# Patient Record
Sex: Female | Born: 1990
Health system: Southern US, Community
[De-identification: ages and names within clinical notes are randomized; demographics above are authoritative.]

## PROBLEM LIST (undated history)

## (undated) ENCOUNTER — Inpatient Hospital Stay (HOSPITAL_COMMUNITY): Payer: Self-pay

## (undated) DIAGNOSIS — N390 Urinary tract infection, site not specified: Secondary | ICD-10-CM

## (undated) DIAGNOSIS — J40 Bronchitis, not specified as acute or chronic: Secondary | ICD-10-CM

## (undated) DIAGNOSIS — A749 Chlamydial infection, unspecified: Secondary | ICD-10-CM

## (undated) DIAGNOSIS — A599 Trichomoniasis, unspecified: Secondary | ICD-10-CM

## (undated) DIAGNOSIS — D649 Anemia, unspecified: Secondary | ICD-10-CM

## (undated) DIAGNOSIS — G43909 Migraine, unspecified, not intractable, without status migrainosus: Secondary | ICD-10-CM

## (undated) DIAGNOSIS — O24419 Gestational diabetes mellitus in pregnancy, unspecified control: Secondary | ICD-10-CM

## (undated) HISTORY — PX: NO PAST SURGERIES: SHX2092

---
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
## 2010-03-12 ENCOUNTER — Inpatient Hospital Stay (HOSPITAL_COMMUNITY): Admission: AD | Admit: 2010-03-12 | Discharge: 2010-03-12 | Payer: Self-pay | Admitting: Obstetrics & Gynecology

## 2010-04-15 ENCOUNTER — Ambulatory Visit: Payer: Self-pay | Admitting: Obstetrics and Gynecology

## 2010-09-28 ENCOUNTER — Emergency Department (HOSPITAL_COMMUNITY)
Admission: EM | Admit: 2010-09-28 | Discharge: 2010-09-28 | Payer: Self-pay | Source: Home / Self Care | Admitting: Emergency Medicine

## 2010-10-24 DIAGNOSIS — G43909 Migraine, unspecified, not intractable, without status migrainosus: Secondary | ICD-10-CM

## 2010-10-24 HISTORY — DX: Migraine, unspecified, not intractable, without status migrainosus: G43.909

## 2011-01-09 LAB — COMPREHENSIVE METABOLIC PANEL
AST: 16 U/L (ref 0–37)
Albumin: 4 g/dL (ref 3.5–5.2)
CO2: 30 mEq/L (ref 19–32)
Calcium: 9.9 mg/dL (ref 8.4–10.5)
Creatinine, Ser: 0.72 mg/dL (ref 0.4–1.2)
GFR calc Af Amer: >60 mL/min (ref >60)
Glucose, Bld: 105 mg/dL — ABNORMAL HIGH (ref 70–99)
Potassium: 4.3 mEq/L (ref 3.5–5.1)

## 2011-01-09 LAB — GC/CHLAMYDIA PROBE AMP, GENITAL: GC Probe Amp, Genital: NEGATIVE

## 2011-01-09 LAB — CBC
Hemoglobin: 12 g/dL (ref 12.0–15.0)
RBC: 5.28 MIL/uL — ABNORMAL HIGH (ref 3.87–5.11)
WBC: 7.4 10*3/uL (ref 4.0–10.5)

## 2011-01-09 LAB — URINE MICROSCOPIC-ADD ON

## 2011-01-09 LAB — URINALYSIS, ROUTINE W REFLEX MICROSCOPIC
Bilirubin Urine: NEGATIVE
Specific Gravity, Urine: 1.025 (ref 1.005–1.030)

## 2011-01-09 LAB — POCT PREGNANCY, URINE: Preg Test, Ur: NEGATIVE

## 2011-01-09 LAB — WET PREP, GENITAL
Clue Cells Wet Prep HPF POC: NONE SEEN
Trich, Wet Prep: NONE SEEN

## 2011-01-10 LAB — URINALYSIS, ROUTINE W REFLEX MICROSCOPIC
Bilirubin Urine: NEGATIVE
Glucose, UA: NEGATIVE mg/dL
Protein, ur: NEGATIVE mg/dL
Urobilinogen, UA: 0.2 mg/dL (ref 0.0–1.0)

## 2011-01-10 LAB — URINE MICROSCOPIC-ADD ON

## 2011-01-10 LAB — CBC
MCV: 72.4 fL — ABNORMAL LOW (ref 78.0–100.0)
Platelets: 282 10*3/uL (ref 150–400)
RBC: 5.14 MIL/uL — ABNORMAL HIGH (ref 3.87–5.11)
RDW: 13.9 % (ref 11.5–15.5)
WBC: 8.8 10*3/uL (ref 4.0–10.5)

## 2011-01-10 LAB — POCT PREGNANCY, URINE: Preg Test, Ur: NEGATIVE

## 2011-01-16 LAB — COMPREHENSIVE METABOLIC PANEL
ALT: 11 U/L (ref 0–35)
Alkaline Phosphatase: 64 U/L (ref 39–117)
CO2: 28 mEq/L (ref 19–32)
Chloride: 100 mEq/L (ref 96–112)
GFR calc non Af Amer: >60 mL/min (ref >60)
Glucose, Bld: 80 mg/dL (ref 70–99)
Potassium: 4.6 mEq/L (ref 3.5–5.1)
Sodium: 132 mEq/L — ABNORMAL LOW (ref 135–145)
Total Bilirubin: 0.4 mg/dL (ref 0.3–1.2)

## 2011-01-16 LAB — CBC
MCHC: 32 g/dL (ref 30.0–36.0)
MCV: 72.9 fL — ABNORMAL LOW (ref 78.0–100.0)
RBC: 4.93 MIL/uL (ref 3.87–5.11)
WBC: 8 10*3/uL (ref 4.0–10.5)

## 2011-01-16 LAB — DIFFERENTIAL
Basophils Relative: 0 % (ref 0–1)
Eosinophils Absolute: 0.1 10*3/uL (ref 0.0–0.7)
Neutrophils Relative %: 78 % — ABNORMAL HIGH (ref 43–77)

## 2011-01-27 LAB — URINALYSIS, ROUTINE W REFLEX MICROSCOPIC
Bilirubin Urine: NEGATIVE
Ketones, ur: NEGATIVE mg/dL
Nitrite: NEGATIVE
Protein, ur: NEGATIVE mg/dL
Protein, ur: NEGATIVE mg/dL
Urobilinogen, UA: 0.2 mg/dL (ref 0.0–1.0)

## 2011-01-27 LAB — POCT PREGNANCY, URINE
Preg Test, Ur: NEGATIVE
Preg Test, Ur: NEGATIVE

## 2011-01-27 LAB — COMPREHENSIVE METABOLIC PANEL
ALT: 14 U/L (ref 0–35)
AST: 13 U/L (ref 0–37)
Albumin: 3.8 g/dL (ref 3.5–5.2)
Alkaline Phosphatase: 65 U/L (ref 47–119)
BUN: 7 mg/dL (ref 6–23)
Chloride: 104 mEq/L (ref 96–112)
Potassium: 3.7 mEq/L (ref 3.5–5.1)
Sodium: 137 mEq/L (ref 135–145)
Total Bilirubin: 0.2 mg/dL — ABNORMAL LOW (ref 0.3–1.2)

## 2011-01-27 LAB — DIFFERENTIAL
Basophils Absolute: 0 10*3/uL (ref 0.0–0.1)
Basophils Relative: 0 % (ref 0–1)
Eosinophils Absolute: 0.1 10*3/uL (ref 0.0–1.2)
Eosinophils Relative: 1 % (ref 0–5)
Monocytes Absolute: 0.5 10*3/uL (ref 0.2–1.2)
Monocytes Relative: 6 % (ref 3–11)
Neutro Abs: 7.5 10*3/uL (ref 1.7–8.0)

## 2011-01-27 LAB — CBC
HCT: 37.5 % (ref 36.0–49.0)
Platelets: 262 10*3/uL (ref 150–400)
WBC: 9.4 10*3/uL (ref 4.5–13.5)

## 2011-01-27 LAB — URINE CULTURE: Culture: NO GROWTH

## 2011-01-27 LAB — GC/CHLAMYDIA PROBE AMP, GENITAL
Chlamydia, DNA Probe: NEGATIVE
GC Probe Amp, Genital: NEGATIVE

## 2011-01-27 LAB — RPR: RPR Ser Ql: NONREACTIVE

## 2011-01-27 LAB — URINE MICROSCOPIC-ADD ON

## 2011-01-29 LAB — URINE MICROSCOPIC-ADD ON

## 2011-01-29 LAB — URINALYSIS, ROUTINE W REFLEX MICROSCOPIC
Bilirubin Urine: NEGATIVE
Hgb urine dipstick: NEGATIVE
Nitrite: NEGATIVE
Specific Gravity, Urine: 1.015 (ref 1.005–1.030)
Urobilinogen, UA: 1 mg/dL (ref 0.0–1.0)
pH: 7 (ref 5.0–8.0)

## 2011-02-01 LAB — DIFFERENTIAL
Basophils Absolute: 0 10*3/uL (ref 0.0–0.1)
Basophils Relative: 0 % (ref 0–1)
Eosinophils Absolute: 0.1 10*3/uL (ref 0.0–1.2)
Monocytes Relative: 5 % (ref 3–11)
Neutro Abs: 9.4 10*3/uL — ABNORMAL HIGH (ref 1.7–8.0)
Neutrophils Relative %: 84 % — ABNORMAL HIGH (ref 43–71)

## 2011-02-01 LAB — URINALYSIS, ROUTINE W REFLEX MICROSCOPIC
Glucose, UA: NEGATIVE mg/dL
Hgb urine dipstick: NEGATIVE
Ketones, ur: NEGATIVE mg/dL
Protein, ur: NEGATIVE mg/dL
Urobilinogen, UA: 0.2 mg/dL (ref 0.0–1.0)

## 2011-02-01 LAB — BASIC METABOLIC PANEL
BUN: 3 mg/dL — ABNORMAL LOW (ref 6–23)
CO2: 29 mEq/L (ref 19–32)
Calcium: 9.6 mg/dL (ref 8.4–10.5)
Chloride: 103 mEq/L (ref 96–112)
Creatinine, Ser: 0.57 mg/dL (ref 0.4–1.2)
Glucose, Bld: 108 mg/dL — ABNORMAL HIGH (ref 70–99)

## 2011-02-01 LAB — CBC
MCHC: 32.3 g/dL (ref 31.0–37.0)
MCV: 72.8 fL — ABNORMAL LOW (ref 78.0–98.0)
Platelets: 258 10*3/uL (ref 150–400)
RDW: 13.7 % (ref 11.4–15.5)

## 2011-02-07 LAB — URINALYSIS, ROUTINE W REFLEX MICROSCOPIC
Nitrite: NEGATIVE
Specific Gravity, Urine: 1.027 (ref 1.005–1.030)
Urobilinogen, UA: 1 mg/dL (ref 0.0–1.0)
pH: 6.5 (ref 5.0–8.0)

## 2011-02-07 LAB — DIFFERENTIAL
Basophils Relative: 0 % (ref 0–1)
Lymphocytes Relative: 18 % — ABNORMAL LOW (ref 24–48)
Monocytes Relative: 5 % (ref 3–11)
Neutro Abs: 6.7 10*3/uL (ref 1.7–8.0)
Neutrophils Relative %: 75 % — ABNORMAL HIGH (ref 43–71)

## 2011-02-07 LAB — URINE MICROSCOPIC-ADD ON

## 2011-02-07 LAB — CBC
MCHC: 31.9 g/dL (ref 31.0–37.0)
RBC: 5.2 MIL/uL (ref 3.80–5.70)
WBC: 8.9 10*3/uL (ref 4.5–13.5)

## 2011-02-08 LAB — GC/CHLAMYDIA PROBE AMP, GENITAL
Chlamydia, DNA Probe: POSITIVE — AB
GC Probe Amp, Genital: NEGATIVE

## 2011-02-08 LAB — GC/CHLAMYDIA PROBE AMP, URINE
Chlamydia, Swab/Urine, PCR: NEGATIVE
GC Probe Amp, Urine: NEGATIVE

## 2011-02-08 LAB — URINALYSIS, ROUTINE W REFLEX MICROSCOPIC
Bilirubin Urine: NEGATIVE
Glucose, UA: NEGATIVE mg/dL
Hgb urine dipstick: NEGATIVE
Ketones, ur: NEGATIVE mg/dL
Nitrite: NEGATIVE
Protein, ur: NEGATIVE mg/dL
Specific Gravity, Urine: 1.018 (ref 1.005–1.030)
Urobilinogen, UA: 1 mg/dL (ref 0.0–1.0)
pH: 7.5 (ref 5.0–8.0)

## 2011-02-08 LAB — WET PREP, GENITAL
Clue Cells Wet Prep HPF POC: NONE SEEN
Trich, Wet Prep: NONE SEEN
Yeast Wet Prep HPF POC: NONE SEEN

## 2011-02-08 LAB — POCT PREGNANCY, URINE: Preg Test, Ur: NEGATIVE

## 2011-03-07 ENCOUNTER — Inpatient Hospital Stay (HOSPITAL_COMMUNITY)
Admission: AD | Admit: 2011-03-07 | Discharge: 2011-03-08 | Disposition: A | Discharge status: Home / Self Care | Payer: Self-pay | Source: Ambulatory Visit | Attending: Family Medicine | Admitting: Family Medicine

## 2011-03-07 DIAGNOSIS — A5901 Trichomonal vulvovaginitis: Secondary | ICD-10-CM | POA: Insufficient documentation

## 2011-03-07 DIAGNOSIS — R109 Unspecified abdominal pain: Secondary | ICD-10-CM

## 2011-03-07 DIAGNOSIS — R51 Headache: Secondary | ICD-10-CM | POA: Insufficient documentation

## 2011-03-07 LAB — URINALYSIS, ROUTINE W REFLEX MICROSCOPIC
Protein, ur: NEGATIVE mg/dL
Urobilinogen, UA: 0.2 mg/dL (ref 0.0–1.0)

## 2011-03-07 LAB — WET PREP, GENITAL

## 2011-03-07 LAB — URINE MICROSCOPIC-ADD ON

## 2011-03-07 LAB — POCT PREGNANCY, URINE: Preg Test, Ur: NEGATIVE

## 2011-03-08 LAB — GC/CHLAMYDIA PROBE AMP, GENITAL
Chlamydia, DNA Probe: NEGATIVE
GC Probe Amp, Genital: NEGATIVE

## 2011-03-10 ENCOUNTER — Inpatient Hospital Stay (HOSPITAL_COMMUNITY)
Admission: AD | Admit: 2011-03-10 | Discharge: 2011-03-10 | Disposition: A | Discharge status: Home / Self Care | Payer: Self-pay | Source: Ambulatory Visit | Attending: Family Medicine | Admitting: Family Medicine

## 2011-03-10 DIAGNOSIS — R109 Unspecified abdominal pain: Secondary | ICD-10-CM | POA: Insufficient documentation

## 2011-03-10 LAB — COMPREHENSIVE METABOLIC PANEL
Albumin: 3.9 g/dL (ref 3.5–5.2)
Alkaline Phosphatase: 72 U/L (ref 39–117)
BUN: 7 mg/dL (ref 6–23)
CO2: 27 mEq/L (ref 19–32)
Chloride: 100 mEq/L (ref 96–112)
Creatinine, Ser: 0.77 mg/dL (ref 0.4–1.2)
GFR calc non Af Amer: >60 mL/min (ref >60)
Glucose, Bld: 120 mg/dL — ABNORMAL HIGH (ref 70–99)
Potassium: 3.5 mEq/L (ref 3.5–5.1)
Total Bilirubin: 0.2 mg/dL — ABNORMAL LOW (ref 0.3–1.2)

## 2011-03-10 LAB — URINALYSIS, ROUTINE W REFLEX MICROSCOPIC
Glucose, UA: NEGATIVE mg/dL
Leukocytes, UA: NEGATIVE
Nitrite: NEGATIVE
Protein, ur: 30 mg/dL — AB

## 2011-03-10 LAB — CBC
Hemoglobin: 11.5 g/dL — ABNORMAL LOW (ref 12.0–15.0)
MCHC: 30.5 g/dL (ref 30.0–36.0)
Platelets: 291 10*3/uL (ref 150–400)
RBC: 5.34 MIL/uL — ABNORMAL HIGH (ref 3.87–5.11)

## 2011-03-10 LAB — POCT PREGNANCY, URINE: Preg Test, Ur: NEGATIVE

## 2011-03-10 LAB — HCG, SERUM, QUALITATIVE: Preg, Serum: NEGATIVE

## 2011-03-10 LAB — URINE MICROSCOPIC-ADD ON

## 2011-05-19 ENCOUNTER — Inpatient Hospital Stay (HOSPITAL_COMMUNITY)
Admission: AD | Admit: 2011-05-19 | Discharge: 2011-05-19 | Disposition: A | Discharge status: Home / Self Care | Payer: Self-pay | Source: Ambulatory Visit | Attending: Obstetrics & Gynecology | Admitting: Obstetrics & Gynecology

## 2011-05-19 ENCOUNTER — Encounter (HOSPITAL_COMMUNITY): Payer: Self-pay | Admitting: *Deleted

## 2011-05-19 DIAGNOSIS — N9489 Other specified conditions associated with female genital organs and menstrual cycle: Secondary | ICD-10-CM | POA: Insufficient documentation

## 2011-05-19 DIAGNOSIS — N898 Other specified noninflammatory disorders of vagina: Secondary | ICD-10-CM | POA: Insufficient documentation

## 2011-05-19 DIAGNOSIS — R109 Unspecified abdominal pain: Secondary | ICD-10-CM | POA: Insufficient documentation

## 2011-05-19 HISTORY — DX: Trichomoniasis, unspecified: A59.9

## 2011-05-19 HISTORY — DX: Migraine, unspecified, not intractable, without status migrainosus: G43.909

## 2011-05-19 HISTORY — DX: Chlamydial infection, unspecified: A74.9

## 2011-05-19 LAB — URINALYSIS, ROUTINE W REFLEX MICROSCOPIC
Ketones, ur: NEGATIVE mg/dL
Nitrite: NEGATIVE
Protein, ur: NEGATIVE mg/dL
Urobilinogen, UA: 0.2 mg/dL (ref 0.0–1.0)

## 2011-05-19 LAB — URINE MICROSCOPIC-ADD ON

## 2011-05-19 LAB — WET PREP, GENITAL: Yeast Wet Prep HPF POC: NONE SEEN

## 2011-05-19 LAB — POCT PREGNANCY, URINE: Preg Test, Ur: NEGATIVE

## 2011-05-19 MED ORDER — FLUCONAZOLE 150 MG PO TABS
150.0000 mg | ORAL_TABLET | Freq: Once | ORAL | Status: AC
  Administered 2011-05-19: 150 mg via ORAL
  Filled 2011-05-19: qty 1

## 2011-05-19 NOTE — Progress Notes (Signed)
By Jenean Lindau PA

## 2011-05-19 NOTE — Progress Notes (Signed)
First intercourse with new partner 3 days ago, breakout began yesterday.

## 2011-05-19 NOTE — ED Provider Notes (Signed)
Lab results back and Wet prep shows TNTC WBC's, few clue cells, the urine shows tr LE and 3 - 6 WBC. Patient denies any UTI symptoms. States it only burns when urine hits the abrasion in the vaginal area. Discussed with patient results of labs and she will only get a call if cultures are positive. Will tx with diflucan today.  [place], [place] 05/19/11 1534

## 2011-05-19 NOTE — ED Provider Notes (Signed)
History   Pt presents today c/o vag irritation for the past 2 days. She also c/o lower abd pain. She was placed on OCPs at the clinic and she felt better when she was taking them. However, she stopped her medication and did not f/u with the provider. She denies vag bleeding or any other sx at this time.  Chief Complaint  Patient presents with  . Vaginal Pain   HPI  OB History    Grav Para Term Preterm Abortions TAB SAB Ect Mult Living   0               Past Medical History  Diagnosis Date  . Chlamydia   . Trichomonas   . Migraines     Past Surgical History  Procedure Date  . No past surgeries     No family history on file.  History  Substance Use Topics  . Smoking status: Never Smoker   . Smokeless tobacco: Never Used  . Alcohol Use: Yes    Allergies: No Known Allergies  Prescriptions prior to admission  Medication Sig Dispense Refill  . acetaminophen (TYLENOL) 500 MG tablet Take 1,500 mg by mouth every 6 (six) hours as needed. PATIENT TAKES FOR PAIN         Review of Systems  Constitutional: Negative for fever.  Cardiovascular: Negative for chest pain and palpitations.  Gastrointestinal: Positive for abdominal pain. Negative for nausea, vomiting, diarrhea and constipation.  Genitourinary: Negative for dysuria, urgency, frequency and hematuria.  Skin: Positive for itching.  Neurological: Negative for dizziness and headaches.  Psychiatric/Behavioral: Negative for depression and suicidal ideas.   Physical Exam   Blood pressure 124/68, pulse 81, temperature 98.9 F (37.2 C), temperature source Oral, resp. rate 16, height 4' 10.5" (1.486 m), weight 128 lb 9.6 oz (58.333 kg), last menstrual period 04/23/2011, SpO2 99.00%.  Physical Exam  Constitutional: She appears well-developed and well-nourished. No distress.  HENT:  Head: Normocephalic and atraumatic.  GI: Soft. She exhibits no distension. There is no tenderness. There is no rebound and no guarding.    Genitourinary:    There is tenderness around the vagina. No bleeding around the vagina. Vaginal discharge found.  Skin: She is not diaphoretic.    MAU Course  Procedures    Assessment and Plan  Care of this pt was transferred to [facility], NP.  Odile Veloso 05/19/2011, 2:32 PM   Henrietta Hoover, PA 05/20/11 0834  Henrietta Hoover, PA 05/20/11 [phone]

## 2011-05-19 NOTE — Progress Notes (Signed)
Pt states she has had abdominal pain "for a long time". Has been seen at [facility] before for this pain and cannot find out what it is and it keeps coming. Pt state she has been having vaginal pain with intercourse(new partner) for about one week and has a "breakout"  Rash with itching around the vagina.

## 2011-05-22 NOTE — ED Provider Notes (Signed)
Cultures came back positive for both GC and Chlamydia. Called patient at home/cell number listed. Message states pt is not available and does not give option to leave message. Will attempt to call again later today.

## 2011-05-23 ENCOUNTER — Encounter (HOSPITAL_COMMUNITY): Payer: Self-pay | Admitting: *Deleted

## 2011-05-23 LAB — HERPES SIMPLEX VIRUS CULTURE: Culture: NOT DETECTED

## 2011-05-30 ENCOUNTER — Telehealth (HOSPITAL_COMMUNITY): Payer: Self-pay

## 2011-07-26 LAB — URINALYSIS, ROUTINE W REFLEX MICROSCOPIC
Bilirubin Urine: NEGATIVE
Ketones, ur: NEGATIVE
Nitrite: NEGATIVE
Urobilinogen, UA: 0.2
pH: 6.5

## 2011-07-26 LAB — MONONUCLEOSIS SCREEN: Mono Screen: NEGATIVE

## 2011-07-26 LAB — URINE MICROSCOPIC-ADD ON

## 2011-07-29 LAB — WET PREP, GENITAL
Clue Cells Wet Prep HPF POC: NONE SEEN
Trich, Wet Prep: NONE SEEN
Yeast Wet Prep HPF POC: NONE SEEN

## 2011-07-29 LAB — URINALYSIS, ROUTINE W REFLEX MICROSCOPIC
Bilirubin Urine: NEGATIVE
Ketones, ur: NEGATIVE mg/dL
Nitrite: NEGATIVE
Protein, ur: NEGATIVE mg/dL
Specific Gravity, Urine: 1.015 (ref 1.005–1.030)
Urobilinogen, UA: 0.2 mg/dL (ref 0.0–1.0)

## 2011-07-29 LAB — URINE MICROSCOPIC-ADD ON

## 2011-07-29 LAB — CBC
Platelets: 267 10*3/uL (ref 150–400)
WBC: 8.5 10*3/uL (ref 4.5–13.5)

## 2011-07-29 LAB — POCT PREGNANCY, URINE: Preg Test, Ur: NEGATIVE

## 2011-10-12 ENCOUNTER — Encounter (HOSPITAL_COMMUNITY): Payer: Self-pay | Admitting: *Deleted

## 2011-10-12 ENCOUNTER — Emergency Department (HOSPITAL_COMMUNITY)
Admission: EM | Admit: 2011-10-12 | Discharge: 2011-10-12 | Disposition: A | Discharge status: Home / Self Care | Payer: Self-pay | Attending: Emergency Medicine | Admitting: Emergency Medicine

## 2011-10-12 DIAGNOSIS — J40 Bronchitis, not specified as acute or chronic: Secondary | ICD-10-CM | POA: Insufficient documentation

## 2011-10-12 DIAGNOSIS — R070 Pain in throat: Secondary | ICD-10-CM | POA: Insufficient documentation

## 2011-10-12 DIAGNOSIS — R0602 Shortness of breath: Secondary | ICD-10-CM | POA: Insufficient documentation

## 2011-10-12 MED ORDER — HYDROCOD POLST-CHLORPHEN POLST 10-8 MG/5ML PO LQCR: 5.0000 mL | Freq: Every evening | ORAL | Status: DC | PRN

## 2011-10-12 MED ORDER — ALBUTEROL SULFATE HFA 108 (90 BASE) MCG/ACT IN AERS: 2.0000 | INHALATION_SPRAY | RESPIRATORY_TRACT | Status: DC | PRN

## 2011-10-12 MED ORDER — AZITHROMYCIN 250 MG PO TABS: 250.0000 mg | ORAL_TABLET | Freq: Every day | ORAL | Status: AC

## 2011-10-12 NOTE — ED Notes (Signed)
Patient states she has had a sore throat, shortness of breath, headaches, and chest pain for 2 weeks.  She reports uri sx.  Patient also reports she has diff sleeping due to sob

## 2011-10-12 NOTE — ED Provider Notes (Signed)
History     CSN: [id number] Arrival date & time: 10/12/2011  2:53 PM   First MD Initiated Contact with Patient 10/12/11 1907      Chief Complaint  Patient presents with  . Sore Throat  . Shortness of Breath  . URI    (Consider location/radiation/quality/duration/timing/severity/associated sxs/prior treatment) HPI Comments: Patient reports she has been sick for 20 full weeks with cough, sore throat, chest congestion, nasal congestion, intermittent fevers to 20, and body aches.  States her cough syrup is not helping her at night and she feels the mucus is too thick to cough up.  Cough has been productive of yellow-green sputum.    Patient is a 20 y.o. female presenting with pharyngitis, shortness of breath, and URI. The history is provided by the patient.  Sore Throat  Shortness of Breath  Associated symptoms include shortness of breath.  URI    Past Medical History  Diagnosis Date  . Chlamydia   . Trichomonas   . Migraines     Past Surgical History  Procedure Date  . No past surgeries     No family history on file.  History  Substance Use Topics  . Smoking status: Never Smoker   . Smokeless tobacco: Never Used  . Alcohol Use: Yes    OB History    Grav Para Term Preterm Abortions TAB SAB Ect Mult Living   0               Review of Systems  Respiratory: Positive for shortness of breath.   All other systems reviewed and are negative.    Allergies  Review of patient's allergies indicates no known allergies.  Home Medications   Current Outpatient Rx  Name Route Sig Dispense Refill  . ACETAMINOPHEN 500 MG PO TABS Oral Take 1,500 mg by mouth every 6 (six) hours as needed. PATIENT TAKES FOR PAIN     . GUAIFENESIN 100 MG/5ML PO SYRP Oral Take 400 mg by mouth 3 (three) times daily as needed.        BP 119/60  Pulse 82  Temp(Src) 97.8 F (36.6 C) (Oral)  Resp 16  SpO2 100%  Physical Exam  Nursing note and vitals reviewed. Constitutional: She is  oriented to person, place, and time. She appears well-developed and well-nourished.  HENT:  Head: Normocephalic and atraumatic.  Right Ear: External ear normal.  Left Ear: External ear normal.  Nose: Mucosal edema and rhinorrhea present.  Mouth/Throat: Uvula is midline and mucous membranes are normal. No uvula swelling. Posterior oropharyngeal erythema present. No oropharyngeal exudate, posterior oropharyngeal edema or tonsillar abscesses.  Neck: Trachea normal, normal range of motion and phonation normal. Neck supple. No tracheal tenderness present. No rigidity. No tracheal deviation and normal range of motion present.  Cardiovascular: Normal rate and regular rhythm.   Pulmonary/Chest: Effort normal and breath sounds normal. No stridor. No respiratory distress. She has no decreased breath sounds. She has no wheezes. She has no rales.       +cough  Neurological: She is alert and oriented to person, place, and time.    ED Course  Procedures (including critical care time)  Labs Reviewed - No data to display No results found.   1. Bronchitis       MDM  Patient with upper respiratory symptoms x 2 weeks with intermittent fevers at home.  D/C with antibiotic as duration of illness suggests possibility of bacterial component.          Dillard Cannon  [place], [place] 10/12/11 2032

## 2011-10-13 NOTE — ED Provider Notes (Signed)
Medical screening examination/treatment/procedure(s) were performed by non-physician practitioner and as supervising physician I was immediately available for consultation/collaboration.  Toy Baker, MD 10/13/11 [phone]

## 2011-10-23 ENCOUNTER — Inpatient Hospital Stay (HOSPITAL_COMMUNITY)
Admission: AD | Admit: 2011-10-23 | Discharge: 2011-10-23 | Disposition: A | Discharge status: Home / Self Care | Payer: Self-pay | Source: Ambulatory Visit | Attending: Obstetrics & Gynecology | Admitting: Obstetrics & Gynecology

## 2011-10-23 ENCOUNTER — Encounter (HOSPITAL_COMMUNITY): Payer: Self-pay | Admitting: *Deleted

## 2011-10-23 DIAGNOSIS — N949 Unspecified condition associated with female genital organs and menstrual cycle: Secondary | ICD-10-CM | POA: Insufficient documentation

## 2011-10-23 DIAGNOSIS — A499 Bacterial infection, unspecified: Secondary | ICD-10-CM

## 2011-10-23 DIAGNOSIS — R1032 Left lower quadrant pain: Secondary | ICD-10-CM | POA: Insufficient documentation

## 2011-10-23 DIAGNOSIS — B373 Candidiasis of vulva and vagina: Secondary | ICD-10-CM

## 2011-10-23 DIAGNOSIS — B3731 Acute candidiasis of vulva and vagina: Secondary | ICD-10-CM | POA: Insufficient documentation

## 2011-10-23 DIAGNOSIS — N760 Acute vaginitis: Secondary | ICD-10-CM | POA: Insufficient documentation

## 2011-10-23 DIAGNOSIS — B9689 Other specified bacterial agents as the cause of diseases classified elsewhere: Secondary | ICD-10-CM | POA: Insufficient documentation

## 2011-10-23 HISTORY — DX: Bronchitis, not specified as acute or chronic: J40

## 2011-10-23 LAB — URINE MICROSCOPIC-ADD ON

## 2011-10-23 LAB — POCT PREGNANCY, URINE: Preg Test, Ur: NEGATIVE

## 2011-10-23 LAB — URINALYSIS, ROUTINE W REFLEX MICROSCOPIC
Bilirubin Urine: NEGATIVE
Glucose, UA: NEGATIVE mg/dL
Ketones, ur: 15 mg/dL — AB
Specific Gravity, Urine: 1.025 (ref 1.005–1.030)
pH: 6 (ref 5.0–8.0)

## 2011-10-23 LAB — WET PREP, GENITAL: Trich, Wet Prep: NONE SEEN

## 2011-10-23 MED ORDER — FLUCONAZOLE 150 MG PO TABS
150.0000 mg | ORAL_TABLET | Freq: Once | ORAL | Status: AC
  Administered 2011-10-23: 150 mg via ORAL
  Filled 2011-10-23: qty 1

## 2011-10-23 MED ORDER — METRONIDAZOLE 500 MG PO TABS: 500.0000 mg | ORAL_TABLET | Freq: Two times a day (BID) | ORAL | Status: AC

## 2011-10-23 MED ORDER — GUAIFENESIN 100 MG/5ML PO SOLN
10.0000 mL | Freq: Once | ORAL | Status: AC
  Administered 2011-10-23: 200 mg via ORAL
  Filled 2011-10-23: qty 15

## 2011-10-23 NOTE — Progress Notes (Signed)
C/o burning upon urination today, vaginal pain during intercourse and mostly after orgasm, lower left abd pain, watery discharge with an odor.

## 2011-10-23 NOTE — Progress Notes (Signed)
Pt states for the last monthe she has had vaginal pain and a discharge that is watery and clear for the last 2 weeks-sttes she also has lower abd pain-appears relaxed with no signs of pain

## 2011-10-23 NOTE — ED Provider Notes (Signed)
History     Chief Complaint  Patient presents with  . Vaginal Pain   HPI  C/o burning upon urination today, vaginal pain during intercourse and mostly after orgasm, lower left abd pain, watery discharge with an odor x 1 month.  LMP 08/27/11, no birth control method.     Past Medical History  Diagnosis Date  . Chlamydia   . Trichomonas   . Migraines   . Bronchitis     Past Surgical History  Procedure Date  . No past surgeries     Family History  Problem Relation Age of Onset  . Asthma Mother   . Arthritis Mother   . Diabetes Mother   . Heart disease Mother   . Hypertension Mother   . Stroke Mother   . Kidney disease Mother   . Vision loss Mother   . Diabetes Maternal Grandmother   . Hypertension Maternal Grandmother     History  Substance Use Topics  . Smoking status: Never Smoker   . Smokeless tobacco: Former Neurosurgeon  . Alcohol Use: Yes     socially    Allergies: No Known Allergies  Prescriptions prior to admission  Medication Sig Dispense Refill  . acetaminophen (TYLENOL) 500 MG tablet Take 1,500 mg by mouth every 6 (six) hours as needed. PATIENT TAKES FOR PAIN       . albuterol (PROVENTIL HFA;VENTOLIN HFA) 108 (90 BASE) MCG/ACT inhaler Inhale 2 puffs into the lungs every 4 (four) hours as needed for wheezing or shortness of breath (cough).  1 Inhaler  0  . guaifenesin (ROBITUSSIN) 100 MG/5ML syrup Take 400 mg by mouth 3 (three) times daily as needed.          Review of Systems  Constitutional: Negative.   Respiratory: Positive for cough. Negative for hemoptysis and shortness of breath.   Gastrointestinal: Positive for abdominal pain.  Genitourinary:       Vaginal discharge, +odor  All other systems reviewed and are negative.   Physical Exam   Blood pressure 130/70, pulse 74, temperature 98.9 F (37.2 C), temperature source Oral, resp. rate 16, height 5' (1.524 m), weight 56.756 kg (125 lb 2 oz), last menstrual period 09/23/2011.  Physical Exam    Constitutional: She is oriented to person, place, and time. She appears well-developed and well-nourished. No distress.  HENT:  Head: Normocephalic.  Neck: Normal range of motion. Neck supple.  Cardiovascular: Normal rate, regular rhythm and normal heart sounds.   Respiratory: Effort normal and breath sounds normal. No respiratory distress.  GI: Soft. There is tenderness ( midpelvic). There is no CVA tenderness.  Genitourinary: Cervix exhibits no motion tenderness and no discharge. No bleeding around the vagina. Vaginal discharge (white, thick, curd-like) found.  Musculoskeletal: Normal range of motion.  Neurological: She is alert and oriented to person, place, and time.  Skin: Skin is warm and dry.  Psychiatric: She has a normal mood and affect.    MAU Course  Procedures  Results for orders placed during the hospital encounter of 10/23/11 (from the past 24 hour(s))  POCT PREGNANCY, URINE     Status: Normal   Collection Time   10/23/11  7:31 PM      Component Value Range   Preg Test, Ur NEGATIVE    WET PREP, GENITAL     Status: Abnormal   Collection Time   10/23/11  8:20 PM      Component Value Range   Yeast, Wet Prep RARE (*) NONE SEEN  Trich, Wet Prep NONE SEEN  NONE SEEN    Clue Cells, Wet Prep RARE (*) NONE SEEN    WBC, Wet Prep HPF POC MODERATE (*) NONE SEEN     Assessment and Plan  Yeast Infection Bacterial Vaginosis  Plan: Diflucan 150 mg po Flagyl FU prn  [facility] 10/23/2011, 8:12 PM

## 2012-01-03 ENCOUNTER — Inpatient Hospital Stay (HOSPITAL_COMMUNITY)
Admission: AD | Admit: 2012-01-03 | Discharge: 2012-01-03 | Disposition: A | Discharge status: Home / Self Care | Payer: Self-pay | Source: Ambulatory Visit | Attending: Obstetrics & Gynecology | Admitting: Obstetrics & Gynecology

## 2012-01-03 ENCOUNTER — Encounter (HOSPITAL_COMMUNITY): Payer: Self-pay | Admitting: *Deleted

## 2012-01-03 ENCOUNTER — Inpatient Hospital Stay (HOSPITAL_COMMUNITY): Payer: Self-pay

## 2012-01-03 DIAGNOSIS — N949 Unspecified condition associated with female genital organs and menstrual cycle: Secondary | ICD-10-CM | POA: Insufficient documentation

## 2012-01-03 DIAGNOSIS — R109 Unspecified abdominal pain: Secondary | ICD-10-CM | POA: Insufficient documentation

## 2012-01-03 LAB — CBC
MCV: 71.8 fL — ABNORMAL LOW (ref 78.0–100.0)
Platelets: 261 10*3/uL (ref 150–400)
RDW: 14.2 % (ref 11.5–15.5)
WBC: 8.6 10*3/uL (ref 4.0–10.5)

## 2012-01-03 LAB — URINALYSIS, ROUTINE W REFLEX MICROSCOPIC
Hgb urine dipstick: NEGATIVE
Ketones, ur: NEGATIVE mg/dL
Protein, ur: 30 mg/dL — AB
Urobilinogen, UA: 0.2 mg/dL (ref 0.0–1.0)

## 2012-01-03 LAB — WET PREP, GENITAL
Clue Cells Wet Prep HPF POC: NONE SEEN
Trich, Wet Prep: NONE SEEN

## 2012-01-03 LAB — URINE MICROSCOPIC-ADD ON

## 2012-01-03 MED ORDER — NAPROXEN SODIUM 550 MG PO TABS: 550.0000 mg | ORAL_TABLET | Freq: Two times a day (BID) | ORAL | Status: DC

## 2012-01-03 NOTE — MAU Provider Note (Signed)
Laura Hernandez is a 20 yr G0P0 present today with CC of abdominal pain and intermittent vaginal bleeding for the last few months. She reports irregular menstrual cycles and states her last menstruation was 10/29/11. Since that time she reports intermittent spotting along with a clear whitish vaginal discharge. She describes her abdominal pain as sharp and suprapubic usually occuring daily. She denies any urinary symptoms and fever but states she has been dx with Trich and Chlamydia  over the last few months. She denies the use of any contraceptive. She does not have a primary OB/GYN.   ROS as stated in HPI.   Physical Exam  General: Well appearing female in not acute distress.  Heart: RRR, no murmurs. Lungs: CTA bilaterally  Abdominal: BS present. Belly soft, non distended and without tenderness to palpation.  GU: Normal appearing external genitalia. White milky discharge noted. Cervix easily visualized. Bimanual: No adnexal or cervical motion tenderness.   Sam Dunn, PA-S2  I was present for the PA student's exam and agree with the above.  Results for orders placed during the hospital encounter of 01/03/12 (from the past 72 hour(s))  URINALYSIS, ROUTINE W REFLEX MICROSCOPIC     Status: Abnormal   Collection Time   01/03/12  2:35 PM      Component Value Range Comment   Color, Urine YELLOW  YELLOW     APPearance HAZY (*) CLEAR     Specific Gravity, Urine 1.020  1.005 - 1.030     pH 7.0  5.0 - 8.0     Glucose, UA NEGATIVE  NEGATIVE (mg/dL)    Hgb urine dipstick NEGATIVE  NEGATIVE     Bilirubin Urine NEGATIVE  NEGATIVE     Ketones, ur NEGATIVE  NEGATIVE (mg/dL)    Protein, ur 30 (*) NEGATIVE (mg/dL)    Urobilinogen, UA 0.2  0.0 - 1.0 (mg/dL)    Nitrite NEGATIVE  NEGATIVE     Leukocytes, UA TRACE (*) NEGATIVE    URINE MICROSCOPIC-ADD ON     Status: Abnormal   Collection Time   01/03/12  2:35 PM      Component Value Range Comment   Squamous Epithelial / LPF FEW (*) RARE     WBC, UA 7-10  <3  (WBC/hpf)    RBC / HPF 0-2  <3 (RBC/hpf)    Bacteria, UA MANY (*) RARE     Urine-Other MUCOUS PRESENT     POCT PREGNANCY, URINE     Status: Normal   Collection Time   01/03/12  2:42 PM      Component Value Range Comment   Preg Test, Ur NEGATIVE  NEGATIVE    WET PREP, GENITAL     Status: Abnormal   Collection Time   01/03/12  4:00 PM      Component Value Range Comment   Yeast Wet Prep HPF POC NONE SEEN  NONE SEEN     Trich, Wet Prep NONE SEEN  NONE SEEN     Clue Cells Wet Prep HPF POC NONE SEEN  NONE SEEN     WBC, Wet Prep HPF POC MANY (*) NONE SEEN  MODERATE BACTERIA SEEN  CBC     Status: Abnormal   Collection Time   01/03/12  4:20 PM      Component Value Range Comment   WBC 8.6  4.0 - 10.5 (K/uL)    RBC 5.22 (*) 3.87 - 5.11 (MIL/uL)    Hemoglobin 11.4 (*) 12.0 - 15.0 (g/dL)    HCT 37.5  36.0 - 46.0 (%)    MCV 71.8 (*) 78.0 - 100.0 (fL)    MCH 21.8 (*) 26.0 - 34.0 (pg)    MCHC 30.4  30.0 - 36.0 (g/dL)    RDW 40.9  81.1 - 91.4 (%)    Platelets 261  150 - 400 (K/uL)    US Transvaginal Non-ob  01/03/2012  . *RADIOLOGY REPORT*  Clinical Data: Pelvic pain and irregular bleeding  TRANSABDOMINAL AND TRANSVAGINAL ULTRASOUND OF PELVIS Technique:  Both transabdominal and transvaginal ultrasound examinations of the pelvis were performed. Transabdominal technique was performed for global imaging of the pelvis including uterus, ovaries, adnexal regions, and pelvic cul-de-sac.  Comparison: July 27, 2009   It was necessary to proceed with endovaginal exam following the transabdominal exam to visualize the adnexa and endometrium.  Findings:  The uterus is normal in size and echotexture, measuring 7.1 x 2.6 x 4.0 cm.  The endometrial stripe is thin and homogeneous, measuring 8.5 mm in width.  Both ovaries have a normal size and appearance.  The right ovary measures 2.7 x 1.9 x 2.2 cm, and the left ovary measures 3.0 x 1.5 x 1.7 cm.  There are no adnexal masses or free pelvic fluid.  IMPRESSION: Normal  pelvic ultrasound.  Original Report Authenticated By: Brandon Melnick, M.D.   US Pelvis Complete  01/03/2012  . *RADIOLOGY REPORT*  Clinical Data: Pelvic pain and irregular bleeding  TRANSABDOMINAL AND TRANSVAGINAL ULTRASOUND OF PELVIS Technique:  Both transabdominal and transvaginal ultrasound examinations of the pelvis were performed. Transabdominal technique was performed for global imaging of the pelvis including uterus, ovaries, adnexal regions, and pelvic cul-de-sac.  Comparison: July 27, 2009   It was necessary to proceed with endovaginal exam following the transabdominal exam to visualize the adnexa and endometrium.  Findings:  The uterus is normal in size and echotexture, measuring 7.1 x 2.6 x 4.0 cm.  The endometrial stripe is thin and homogeneous, measuring 8.5 mm in width.  Both ovaries have a normal size and appearance.  The right ovary measures 2.7 x 1.9 x 2.2 cm, and the left ovary measures 3.0 x 1.5 x 1.7 cm.  There are no adnexal masses or free pelvic fluid.  IMPRESSION: Normal pelvic ultrasound.  Original Report Authenticated By: Brandon Melnick, M.D.   A/P: 1)Pelvic pain: discussed with pt at length. No obvious etiology at this time. Will await urine and genital cultures. Will give Rx for Anaprox DS. Discussed diet, activity, risks, and precautions.  Clinton Gallant. Laura Hernandez, DrHSc, MPAS, PA-C

## 2012-01-03 NOTE — MAU Note (Signed)
Patient states she has been having irregular periods for a long time, may go several months with having a period. Has pain in her back from upper down to mid back, has a constant headache, has some upper chest pain, nausea, decrease appetite, vaginal swelling with discharge.

## 2012-01-03 NOTE — MAU Provider Note (Signed)
Attestation of Attending Supervision of Advanced Practitioner: Evaluation and management procedures were performed by the PA/NP/CNM/OB Fellow under my supervision/collaboration. Chart reviewed, and agree with management and plan.  Jaynie Collins, M.D. 01/03/2012 6:26 PM

## 2012-01-03 NOTE — Discharge Instructions (Signed)

## 2012-01-04 LAB — URINE CULTURE
Colony Count: 30000
Culture  Setup Time: 201303130322

## 2012-01-14 ENCOUNTER — Emergency Department (HOSPITAL_COMMUNITY)
Admission: EM | Admit: 2012-01-14 | Discharge: 2012-01-14 | Disposition: A | Discharge status: Home / Self Care | Payer: Self-pay | Attending: Emergency Medicine | Admitting: Emergency Medicine

## 2012-01-14 ENCOUNTER — Encounter (HOSPITAL_COMMUNITY): Payer: Self-pay | Admitting: Emergency Medicine

## 2012-01-14 ENCOUNTER — Encounter (HOSPITAL_COMMUNITY): Payer: Self-pay | Admitting: *Deleted

## 2012-01-14 ENCOUNTER — Inpatient Hospital Stay (HOSPITAL_COMMUNITY)
Admission: AD | Admit: 2012-01-14 | Discharge: 2012-01-14 | Disposition: A | Discharge status: Home / Self Care | Payer: Self-pay | Source: Ambulatory Visit | Attending: Obstetrics & Gynecology | Admitting: Obstetrics & Gynecology

## 2012-01-14 DIAGNOSIS — N920 Excessive and frequent menstruation with regular cycle: Secondary | ICD-10-CM | POA: Insufficient documentation

## 2012-01-14 DIAGNOSIS — N898 Other specified noninflammatory disorders of vagina: Secondary | ICD-10-CM

## 2012-01-14 DIAGNOSIS — N949 Unspecified condition associated with female genital organs and menstrual cycle: Secondary | ICD-10-CM | POA: Insufficient documentation

## 2012-01-14 DIAGNOSIS — N938 Other specified abnormal uterine and vaginal bleeding: Secondary | ICD-10-CM | POA: Insufficient documentation

## 2012-01-14 DIAGNOSIS — N939 Abnormal uterine and vaginal bleeding, unspecified: Secondary | ICD-10-CM

## 2012-01-14 DIAGNOSIS — R10819 Abdominal tenderness, unspecified site: Secondary | ICD-10-CM | POA: Insufficient documentation

## 2012-01-14 DIAGNOSIS — N921 Excessive and frequent menstruation with irregular cycle: Secondary | ICD-10-CM

## 2012-01-14 LAB — CBC
HCT: 37.3 % (ref 36.0–46.0)
Hemoglobin: 12 g/dL (ref 12.0–15.0)
MCHC: 32.2 g/dL (ref 30.0–36.0)
RBC: 5.31 MIL/uL — ABNORMAL HIGH (ref 3.87–5.11)
WBC: 8.7 10*3/uL (ref 4.0–10.5)

## 2012-01-14 LAB — URINALYSIS, ROUTINE W REFLEX MICROSCOPIC
Ketones, ur: NEGATIVE mg/dL
Protein, ur: 100 mg/dL — AB
Urobilinogen, UA: 0.2 mg/dL (ref 0.0–1.0)

## 2012-01-14 LAB — URINE MICROSCOPIC-ADD ON

## 2012-01-14 LAB — POCT PREGNANCY, URINE: Preg Test, Ur: NEGATIVE

## 2012-01-14 MED ORDER — MEDROXYPROGESTERONE ACETATE 5 MG PO TABS: 5.0000 mg | ORAL_TABLET | Freq: Every day | ORAL | Status: DC

## 2012-01-14 MED ORDER — MEGESTROL ACETATE 40 MG PO TABS: ORAL_TABLET | ORAL | Status: DC

## 2012-01-14 NOTE — Discharge Instructions (Signed)
Menorrhagia  Menorrhagia is when a menstrual period is heavier or longer than normal. HOME CARE  Only take medicine as told by your doctor.   Do not take aspirin 1 week before or during your period. Aspirin can make the bleeding worse.   Lay down for a while if you change your tampon or pad more than once in 2 hours. This may help lessen the bleeding.   Take any iron pills as told by your doctor. Heavy bleeding may cause you to lack iron in your body.   Eat a healthy diet and foods with iron. These foods include leafy green vegetables, meat, liver, eggs, and whole grain breads and cereals.   Eat foods that are high in vitamin C. These include oranges, orange juice, and grapefruits. Vitamin C can help your body take in more iron.   Do not try to lose weight. Wait until the heavy bleeding has stopped and your iron level is normal.  GET HELP RIGHT AWAY IF:  You get a fever.   You have trouble breathing.   You bleed even more heavily than usual and pass blood clots.   You feel dizzy, weak, or pass out (faint).   You need to change your tampon or pad more than once an hour.   You feel sick to your stomach (nauseous), throw up (vomit), or have watery poop (diarrhea).   You have problems from medicine.  MAKE SURE YOU:   Understand these instructions.   Will watch your condition.   Will get help right away if you are not doing well or get worse.  Document Released: 07/19/2008 Document Revised: 09/29/2011 Document Reviewed: 07/19/2008 [facility] Patient Information 2012 [place], [place].  Return for any new or worsening symptoms or any other concerns.

## 2012-01-14 NOTE — ED Notes (Signed)
Pt in RM 18 with c/o vaginal bleeding x 2 days with abdominal cramping. Pt is A/A/Ox4, skin is warm and dry, respiration is even and unlabored. Pt was seen and examined by Dr. Jamas Lav

## 2012-01-14 NOTE — ED Provider Notes (Signed)
First Provider Initiated Contact with Patient 01/14/12 1516      Chief Complaint:  Vaginal Bleeding   Laura Hernandez is  21 y.o. G0P0.  Patient's last menstrual period was 10/29/2011.Marland Kitchen  Her pregnancy status is negative.  She presents complaining of Vaginal Bleeding Pt has been seen and worked up for this problem last week.  She has an appt with the GYN clinic 02/22/12, but is tired of bleeding.    Past Medical History  Diagnosis Date  . Chlamydia   . Trichomonas   . Migraines   . Bronchitis     Past Surgical History  Procedure Date  . No past surgeries     Family History  Problem Relation Age of Onset  . Asthma Mother   . Arthritis Mother   . Diabetes Mother   . Heart disease Mother   . Hypertension Mother   . Stroke Mother   . Kidney disease Mother   . Vision loss Mother   . Diabetes Maternal Grandmother   . Hypertension Maternal Grandmother     History  Substance Use Topics  . Smoking status: Never Smoker   . Smokeless tobacco: Former Neurosurgeon  . Alcohol Use: Yes     socially    Allergies: No Known Allergies  Prescriptions prior to admission  Medication Sig Dispense Refill  . acetaminophen (TYLENOL) 500 MG tablet Take 1,500 mg by mouth every 6 (six) hours as needed. PATIENT TAKES FOR PAIN. Stomach pains last week      . albuterol (PROVENTIL HFA;VENTOLIN HFA) 108 (90 BASE) MCG/ACT inhaler Inhale 2 puffs into the lungs every 4 (four) hours as needed for wheezing or shortness of breath (cough).  1 Inhaler  0  . guaifenesin (ROBITUSSIN) 100 MG/5ML syrup Take 400 mg by mouth 3 (three) times daily as needed.        . naproxen sodium (ANAPROX DS) 550 MG tablet Take 1 tablet (550 mg total) by mouth 2 (two) times daily with a meal.  30 tablet  0    Review of Systems - Negative except vaginal bleeding and cramping  Physical Exam   Blood pressure 132/74, pulse 87, temperature 98.9 F (37.2 C), temperature source Oral, resp. rate 18, height 4' 11.5" (1.511 m), weight 128  lb (58.06 kg), last menstrual period 10/29/2011.  General: General appearance - alert, well appearing, and in no distress Chest - clear to auscultation, no wheezes, rales or rhonchi, symmetric air entry Heart - normal rate and regular rhythm Abdomen - soft, nontender, nondistended, no masses or organomegaly Pelvic -not repeated Extremities - no pedal edema noted   Labs: Recent Results (from the past 24 hour(s))  URINALYSIS, ROUTINE W REFLEX MICROSCOPIC   Collection Time   01/14/12  2:30 PM      Component Value Range   Color, Urine YELLOW  YELLOW    APPearance CLEAR  CLEAR    Specific Gravity, Urine 1.025  1.005 - 1.030    pH 6.0  5.0 - 8.0    Glucose, UA NEGATIVE  NEGATIVE (mg/dL)   Hgb urine dipstick LARGE (*) NEGATIVE    Bilirubin Urine NEGATIVE  NEGATIVE    Ketones, ur NEGATIVE  NEGATIVE (mg/dL)   Protein, ur 409 (*) NEGATIVE (mg/dL)   Urobilinogen, UA 0.2  0.0 - 1.0 (mg/dL)   Nitrite NEGATIVE  NEGATIVE    Leukocytes, UA NEGATIVE  NEGATIVE   URINE MICROSCOPIC-ADD ON   Collection Time   01/14/12  2:30 PM  Component Value Range   Squamous Epithelial / LPF FEW (*) RARE    WBC, UA 11-20  <3 (WBC/hpf)   RBC / HPF 3-6  <3 (RBC/hpf)   Bacteria, UA FEW (*) RARE   POCT PREGNANCY, URINE   Collection Time   01/14/12  2:41 PM      Component Value Range   Preg Test, Ur NEGATIVE  NEGATIVE   Urine culture, gc/chl negative last week Imaging Studies:  US Transvaginal Non-ob  01/03/2012  . *RADIOLOGY REPORT*  Clinical Data: Pelvic pain and irregular bleeding  TRANSABDOMINAL AND TRANSVAGINAL ULTRASOUND OF PELVIS Technique:  Both transabdominal and transvaginal ultrasound examinations of the pelvis were performed. Transabdominal technique was performed for global imaging of the pelvis including uterus, ovaries, adnexal regions, and pelvic cul-de-sac.  Comparison: July 27, 2009   It was necessary to proceed with endovaginal exam following the transabdominal exam to visualize the adnexa  and endometrium.  Findings:  The uterus is normal in size and echotexture, measuring 7.1 x 2.6 x 4.0 cm.  The endometrial stripe is thin and homogeneous, measuring 8.5 mm in width.  Both ovaries have a normal size and appearance.  The right ovary measures 2.7 x 1.9 x 2.2 cm, and the left ovary measures 3.0 x 1.5 x 1.7 cm.  There are no adnexal masses or free pelvic fluid.  IMPRESSION: Normal pelvic ultrasound.  Original Report Authenticated By: Brandon Melnick, M.D.   US Pelvis Complete  01/03/2012  . *RADIOLOGY REPORT*  Clinical Data: Pelvic pain and irregular bleeding  TRANSABDOMINAL AND TRANSVAGINAL ULTRASOUND OF PELVIS Technique:  Both transabdominal and transvaginal ultrasound examinations of the pelvis were performed. Transabdominal technique was performed for global imaging of the pelvis including uterus, ovaries, adnexal regions, and pelvic cul-de-sac.  Comparison: July 27, 2009   It was necessary to proceed with endovaginal exam following the transabdominal exam to visualize the adnexa and endometrium.  Findings:  The uterus is normal in size and echotexture, measuring 7.1 x 2.6 x 4.0 cm.  The endometrial stripe is thin and homogeneous, measuring 8.5 mm in width.  Both ovaries have a normal size and appearance.  The right ovary measures 2.7 x 1.9 x 2.2 cm, and the left ovary measures 3.0 x 1.5 x 1.7 cm.  There are no adnexal masses or free pelvic fluid.  IMPRESSION: Normal pelvic ultrasound.  Original Report Authenticated By: Brandon Melnick, M.D.     Assessment:  metromenorrhagia  Plan: Megace to stop bleeding/pain; keep appt in GYN clinic 5/1  CRESENZO-DISHMAN,Christiona Siddique

## 2012-01-14 NOTE — MAU Note (Signed)
Pt reports having vaginal bleeding and abd  on and off past 2 weeks. Bleeding and pain has gotten over the past 2 days.

## 2012-01-14 NOTE — ED Notes (Signed)
Per pt, pt went to [facility] hospital today, and did not like tx so came to [facility] for follow up; pt reports feeling very weak, and vaginal bleeding X 2day; pt reports vomiting for X 2days, pt reports dizziness, pt reports abd pain lower and vaginal pain that radiates to lower back and legs described as sharp, stabbing; pt reports going through 20 pads per day

## 2012-01-14 NOTE — Discharge Instructions (Signed)

## 2012-01-14 NOTE — ED Provider Notes (Signed)
I saw and evaluated the patient, reviewed the resident's note and I agree with the findings and plan.  2 weeks vag bleed, few weeks vag discharge but cleared at MAU for that, no pain.  Hurman Horn, MD 01/16/12 2224

## 2012-01-14 NOTE — ED Provider Notes (Signed)
History     CSN: [id number]  Arrival date & time 01/14/12  1547   First MD Initiated Contact with Patient 01/14/12 1732      Chief Complaint  Patient presents with  . Vaginal Bleeding    (Consider location/radiation/quality/duration/timing/severity/associated sxs/prior treatment) HPI CC vaginal bleeding off/on for 2 months.  States she had nl periods until January of this year.  For the past two months has had continual vag bleeding, was seen in clinic and diagnosed with menometrorrhagia, started on megace.  Labs from recent visit show neg gc/chlamydia, h/h nl.  Pt states she has continued to have moderate bleeding since starting on the megace.  She also has mild abd discomfort in her pelvic and vaginal areas. Past Medical History  Diagnosis Date  . Chlamydia   . Trichomonas   . Migraines   . Bronchitis     Past Surgical History  Procedure Date  . No past surgeries     Family History  Problem Relation Age of Onset  . Asthma Mother   . Arthritis Mother   . Diabetes Mother   . Heart disease Mother   . Hypertension Mother   . Stroke Mother   . Kidney disease Mother   . Vision loss Mother   . Diabetes Maternal Grandmother   . Hypertension Maternal Grandmother     History  Substance Use Topics  . Smoking status: Never Smoker   . Smokeless tobacco: Former Neurosurgeon  . Alcohol Use: Yes     socially    OB History    Grav Para Term Preterm Abortions TAB SAB Ect Mult Living   0               Review of Systems  Genitourinary: Positive for vaginal bleeding, vaginal discharge, vaginal pain, menstrual problem and pelvic pain. Negative for dysuria.  All other systems reviewed and are negative.    Allergies  Review of patient's allergies indicates no known allergies.  Home Medications   Current Outpatient Rx  Name Route Sig Dispense Refill  . ALBUTEROL SULFATE HFA 108 (90 BASE) MCG/ACT IN AERS Inhalation Inhale 2 puffs into the lungs every 4 (four) hours as needed.  For shortness of breath    . MEDROXYPROGESTERONE ACETATE 5 MG PO TABS Oral Take 1 tablet (5 mg total) by mouth daily. 30 tablet 0    Generic may be substituted  . MEGESTROL ACETATE 40 MG PO TABS  Take 3/day for 3 days, 2/day for 2 days, and 1/day PO prn bleeding 60 tablet 2    BP 118/68  Pulse 70  Temp(Src) 98.6 F (37 C) (Oral)  Resp 14  SpO2 98%  LMP 10/29/2011  Physical Exam  Nursing note and vitals reviewed. Constitutional: She appears well-developed and well-nourished.  HENT:  Head: Normocephalic and atraumatic.  Eyes: Right eye exhibits no discharge. Left eye exhibits no discharge.  Neck: Normal range of motion. Neck supple.  Cardiovascular: Normal rate, regular rhythm and normal heart sounds.   Pulmonary/Chest: Effort normal and breath sounds normal.  Abdominal: Soft. There is tenderness (mild spt). There is no rebound and no guarding.  Musculoskeletal: She exhibits no edema and no tenderness.  Neurological: She is alert. GCS eye subscore is 4. GCS verbal subscore is 5. GCS motor subscore is 6.  Skin: Skin is warm and dry.  Psychiatric: She has a normal mood and affect. Her behavior is normal.    ED Course  Procedures (including critical care time)  Labs Reviewed  CBC - Abnormal; Notable for the following:    RBC 5.31 (*)    MCV 70.2 (*)    MCH 22.6 (*)    All other components within normal limits   No results found.   1. Menometrorrhagia       MDM  Pt is in nad, afvss, nontoxic appearing, exam and hx as above.  Pt not tachycardic, no conj or skin pallor, will perform pelvic exam.  Getting cbc and upt.  Had neg gc/chlamydia recently.  Also getting orthostatics.  Pt stable  H/h nl, minimal amt of dark red blood in vault, no lesions, no active bleeding.  Pt states she did not fill her megace given by gyn, will d/c with provera 5mg  and pt to fill at [facility] for $4.  She verbalized that she will comply.  Return warnings given.       Elijio Miles,  MD 01/15/12 [phone]

## 2012-02-01 NOTE — ED Provider Notes (Signed)
Medical Screening exam and patient care preformed by advanced practice provider.  Agree with the above management.  

## 2012-02-22 ENCOUNTER — Ambulatory Visit (INDEPENDENT_AMBULATORY_CARE_PROVIDER_SITE_OTHER): Payer: Self-pay | Admitting: Obstetrics and Gynecology

## 2012-02-22 ENCOUNTER — Encounter: Payer: Self-pay | Admitting: Obstetrics and Gynecology

## 2012-02-22 VITALS — BP 134/73 | HR 78 | Temp 97.3°F | Resp 16 | Ht 59.0 in | Wt 125.5 lb

## 2012-02-22 DIAGNOSIS — N9489 Other specified conditions associated with female genital organs and menstrual cycle: Secondary | ICD-10-CM

## 2012-02-22 DIAGNOSIS — N949 Unspecified condition associated with female genital organs and menstrual cycle: Secondary | ICD-10-CM

## 2012-02-22 DIAGNOSIS — B009 Herpesviral infection, unspecified: Secondary | ICD-10-CM

## 2012-02-22 DIAGNOSIS — Z3049 Encounter for surveillance of other contraceptives: Secondary | ICD-10-CM

## 2012-02-22 DIAGNOSIS — A609 Anogenital herpesviral infection, unspecified: Secondary | ICD-10-CM

## 2012-02-22 NOTE — Progress Notes (Signed)
  Subjective:     Laura Hernandez is a 21 y.o. nulliparousfemale who presents for evaluation of a not in her left groin and a lesion left external genitalia present for one day.. The pain is described as tenderness in groin and findings/itching at site of the lesion. : {assoc . The patient denies dysuria.. Risk factors for pelvic/abdominal pain include treatment for multiple STDs in the past year. Of note her GC and Chlamydia were last on 01/03/2012 and were both negative. She was treated for BV needs 3 months ago. Of note she states that her menses were fairly regular until about a year ago when she would have what sounds like menorrhagia in March 2013 she was seen in MAU for bleeding of 2 weeks duration with hemoglobin of 12 and took a course of Megace. She has been on OCPs in the past and had problems with breakthrough bleeding. She is not using contraception last had intercourse 2 days ago. She is having slight spotting now on the Megace did stop her bleeding.   Menstrual History: OB History    Grav Para Term Preterm Abortions TAB SAB Ect Mult Living   0                Review of Systems   Objective:    BP 134/73  Pulse 78  Temp(Src) 97.3 F (36.3 C) (Oral)  Resp 16  Ht 4\' 11"  (1.499 m)  Wt 125 lb 8 oz (56.926 kg)  BMI 25.35 kg/m2  LMP 02/18/2012 General:   alert, cooperative and no distress  Lungs:   clear to auscultation bilaterally  Heart:   regular rate and rhythm, S1, S2 normal, no murmur, click, rub or gallop  Abdomen:  soft, non-tender; bowel sounds normal; no masses,  no organomegaly 1.5 cm tender inguinal node on left  CVA:   absent  Pelvis:  External genitalia: 2 raised lesions left upper perineum, tender to touch: HSV done  Extremities:   extremities normal, atraumatic, no cyanosis or edema  Neurologic:   negative  Psychiatric:   normal mood, behavior, speech, dress, and thought processes   Imaging Had normal pelvic US with thin stripe in March 2013  Assessment:    Possible genital HSV with inguinal lymp[adenopaty    Plan:    Discussed options for contraception which would help with her menorrhagia and decided onDepo  UPT neg. Return after abstinence for 2 wks for UPT and Depo shot.  Will call if HSV culture is positive

## 2012-02-22 NOTE — Progress Notes (Signed)
Pt reports abdominal pain and painful intercourse for several months.

## 2012-02-22 NOTE — Patient Instructions (Signed)
  Place pelvic pain patient instructions here.  

## 2012-02-24 LAB — HERPES SIMPLEX VIRUS CULTURE: Organism ID, Bacteria: DETECTED

## 2012-02-29 ENCOUNTER — Telehealth: Payer: Self-pay | Admitting: Obstetrics and Gynecology

## 2012-02-29 NOTE — Telephone Encounter (Signed)
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 Primary outbreak per pt. Rx called in for Valtrex 500 mg bid x 5d at initial sx of outbreak.

## 2012-03-14 ENCOUNTER — Ambulatory Visit: Payer: Self-pay | Admitting: Advanced Practice Midwife

## 2012-03-14 ENCOUNTER — Telehealth: Payer: Self-pay | Admitting: *Deleted

## 2012-03-14 DIAGNOSIS — B009 Herpesviral infection, unspecified: Secondary | ICD-10-CM

## 2012-03-14 MED ORDER — ACYCLOVIR 400 MG PO TABS: 400.0000 mg | ORAL_TABLET | Freq: Three times a day (TID) | ORAL | Status: DC

## 2012-03-14 NOTE — Telephone Encounter (Signed)
Patient called and left a message that she wanted to talk to someone about results and wants assistance with some information

## 2012-03-14 NOTE — Telephone Encounter (Signed)
Called [facility] and she states she got a call about having herpes and they sent in a prescription but she can't afford it and wonders if she can take a different prescription that costs less.  States she is still having the outbreak but that it is not very painful, but at time is uncomfortable and she want some medicine to help it.  Discussed that she should not be having intercourse until outbreak is completely healed and then partner should wear condoms because you can still transmit without active lesions.  Discussed with provider Wynelle Bourgeois, [facility] prescription for Acyclovir provided. Called Alina and explained to her is her choice to take medicine now since she is not in severe pain, patietn states she wants to take it because she is having discomfort.  Explained to her she should take for 7 days and then if is healed can stop, if not continue for total of 10 days this outbreak. Then get a refill to have on hand and when she feels another outbreak , which may be months or years, start taking to decrease severity and duration . Patient voices understanding.

## 2012-03-30 ENCOUNTER — Ambulatory Visit: Payer: Self-pay | Admitting: Advanced Practice Midwife

## 2012-05-22 ENCOUNTER — Encounter (HOSPITAL_COMMUNITY): Payer: Self-pay | Admitting: Emergency Medicine

## 2012-05-22 ENCOUNTER — Emergency Department (HOSPITAL_COMMUNITY): Payer: Self-pay

## 2012-05-22 ENCOUNTER — Emergency Department (HOSPITAL_COMMUNITY)
Admission: EM | Admit: 2012-05-22 | Discharge: 2012-05-22 | Disposition: A | Discharge status: Home / Self Care | Payer: Self-pay | Attending: Emergency Medicine | Admitting: Emergency Medicine

## 2012-05-22 DIAGNOSIS — R109 Unspecified abdominal pain: Secondary | ICD-10-CM

## 2012-05-22 DIAGNOSIS — G43909 Migraine, unspecified, not intractable, without status migrainosus: Secondary | ICD-10-CM | POA: Insufficient documentation

## 2012-05-22 DIAGNOSIS — R1011 Right upper quadrant pain: Secondary | ICD-10-CM | POA: Insufficient documentation

## 2012-05-22 LAB — CBC WITH DIFFERENTIAL/PLATELET
Basophils Relative: 0 % (ref 0–1)
Eosinophils Relative: 3 % (ref 0–5)
Hemoglobin: 12.7 g/dL (ref 12.0–15.0)
Lymphocytes Relative: 24 % (ref 12–46)
Monocytes Absolute: 0.4 10*3/uL (ref 0.1–1.0)
Monocytes Relative: 4 % (ref 3–12)
Neutrophils Relative %: 69 % (ref 43–77)
Platelets: 283 10*3/uL (ref 150–400)
RBC: 5.63 MIL/uL — ABNORMAL HIGH (ref 3.87–5.11)
WBC: 10.4 10*3/uL (ref 4.0–10.5)

## 2012-05-22 LAB — COMPREHENSIVE METABOLIC PANEL
ALT: 15 U/L (ref 0–35)
Alkaline Phosphatase: 64 U/L (ref 39–117)
BUN: 7 mg/dL (ref 6–23)
CO2: 26 mEq/L (ref 19–32)
Chloride: 100 mEq/L (ref 96–112)
GFR calc Af Amer: >90 mL/min (ref >90)
Glucose, Bld: 87 mg/dL (ref 70–99)
Potassium: 4.1 mEq/L (ref 3.5–5.1)
Sodium: 139 mEq/L (ref 135–145)
Total Bilirubin: 0.3 mg/dL (ref 0.3–1.2)
Total Protein: 9.1 g/dL — ABNORMAL HIGH (ref 6.0–8.3)

## 2012-05-22 LAB — URINALYSIS, ROUTINE W REFLEX MICROSCOPIC
Nitrite: NEGATIVE
Specific Gravity, Urine: 1.02 (ref 1.005–1.030)
Urobilinogen, UA: 0.2 mg/dL (ref 0.0–1.0)
pH: 6 (ref 5.0–8.0)

## 2012-05-22 LAB — HCG, QUANTITATIVE, PREGNANCY: hCG, Beta Chain, Quant, S: <1 m[IU]/mL (ref <5)

## 2012-05-22 LAB — URINE MICROSCOPIC-ADD ON

## 2012-05-22 MED ORDER — DIPHENHYDRAMINE HCL 50 MG/ML IJ SOLN
25.0000 mg | Freq: Once | INTRAMUSCULAR | Status: AC
  Administered 2012-05-22: 25 mg via INTRAVENOUS
  Filled 2012-05-22: qty 1

## 2012-05-22 MED ORDER — SODIUM CHLORIDE 0.9 % IV BOLUS (SEPSIS)
1000.0000 mL | Freq: Once | INTRAVENOUS | Status: AC
  Administered 2012-05-22: 1000 mL via INTRAVENOUS

## 2012-05-22 MED ORDER — METOCLOPRAMIDE HCL 5 MG/ML IJ SOLN
10.0000 mg | Freq: Once | INTRAMUSCULAR | Status: AC
  Administered 2012-05-22: 10 mg via INTRAVENOUS
  Filled 2012-05-22: qty 2

## 2012-05-22 MED ORDER — KETOROLAC TROMETHAMINE 30 MG/ML IJ SOLN
30.0000 mg | Freq: Once | INTRAMUSCULAR | Status: AC
  Administered 2012-05-22: 30 mg via INTRAVENOUS
  Filled 2012-05-22: qty 1

## 2012-05-22 NOTE — ED Notes (Signed)
Pt. In Ultrasound.

## 2012-05-22 NOTE — ED Provider Notes (Signed)
21 year old female with right upper quadrant pain for the last several days. Pain will come and go but last several hours when present. Does not seem to be affected by eating. There is associated nausea. No fever or chills. No diarrhea. A special. Was 2-1/2 months ago. She relates she is nulligravida and has had problems getting pregnant. On exam, she is resting comfortably and in no distress. Lungs are clear. Heart has regular rate and rhythm. Abdomen is soft, flat, with tenderness fairly well localized to the right upper quadrant with a positive Murphy sign. Ultrasound has been ordered.  Dione Booze, MD 05/22/12 2039

## 2012-05-22 NOTE — ED Provider Notes (Signed)
History     CSN: [id number]  Arrival date & time 05/22/12  1630   First MD Initiated Contact with Patient 05/22/12 1923      Chief Complaint  Patient presents with  . Headache    (Consider location/radiation/quality/duration/timing/severity/associated sxs/prior treatment) Patient is a 21 y.o. female presenting with headaches and abdominal pain. The history is provided by the patient.  Headache  This is a recurrent problem. Episode onset: 3 weeks ago. The problem occurs every few minutes. The problem has not changed since onset.The headache is associated with activity and bright light. The pain is located in the frontal region. The quality of the pain is described as dull. The pain is moderate. The pain does not radiate. Pertinent negatives include no fever, no palpitations, no shortness of breath, no nausea and no vomiting. She has tried acetaminophen for the symptoms. The treatment provided no relief.  Abdominal Pain The primary symptoms of the illness include abdominal pain. The primary symptoms of the illness do not include fever, fatigue, shortness of breath, nausea, vomiting, diarrhea, hematochezia, dysuria, vaginal discharge or vaginal bleeding. The current episode started yesterday. The onset of the illness was gradual. The problem has not changed since onset. The abdominal pain is located in the RUQ. The abdominal pain radiates to the back. The abdominal pain is relieved by belching. The abdominal pain is exacerbated by eating.  The patient states that she believes she is currently not pregnant. The patient has not had a change in bowel habit. Symptoms associated with the illness do not include chills, diaphoresis, hematuria or back pain.    Past Medical History  Diagnosis Date  . Chlamydia   . Trichomonas   . Migraines   . Bronchitis     Past Surgical History  Procedure Date  . No past surgeries     Family History  Problem Relation Age of Onset  . Asthma Mother   .  Arthritis Mother   . Diabetes Mother   . Heart disease Mother   . Hypertension Mother   . Stroke Mother   . Kidney disease Mother   . Vision loss Mother   . Diabetes Maternal Grandmother   . Hypertension Maternal Grandmother     History  Substance Use Topics  . Smoking status: Never Smoker   . Smokeless tobacco: Former Neurosurgeon  . Alcohol Use: Yes     socially    OB History    Grav Para Term Preterm Abortions TAB SAB Ect Mult Living   0               Review of Systems  Constitutional: Negative for fever, chills, diaphoresis and fatigue.  HENT: Negative for ear pain, congestion, sore throat, facial swelling, mouth sores, trouble swallowing, neck pain and neck stiffness.   Eyes: Negative.   Respiratory: Negative for apnea, cough, chest tightness, shortness of breath and wheezing.   Cardiovascular: Negative for chest pain, palpitations and leg swelling.  Gastrointestinal: Positive for abdominal pain. Negative for nausea, vomiting, diarrhea, hematochezia, abdominal distention and anal bleeding.  Genitourinary: Negative for dysuria, hematuria, flank pain, vaginal bleeding, vaginal discharge, difficulty urinating and menstrual problem.  Musculoskeletal: Negative for back pain and gait problem.  Skin: Negative for rash and wound.  Neurological: Positive for headaches. Negative for dizziness, tremors, seizures, syncope, facial asymmetry and numbness.  Psychiatric/Behavioral: Negative.   All other systems reviewed and are negative.    Allergies  Latex  Home Medications   Current Outpatient Rx  Name  Route Sig Dispense Refill  . ACETAMINOPHEN 500 MG PO TABS Oral Take 500 mg by mouth every 6 (six) hours as needed. For pain    . ALBUTEROL SULFATE HFA 108 (90 BASE) MCG/ACT IN AERS Inhalation Inhale 2 puffs into the lungs every 4 (four) hours as needed. For shortness of breath      BP 119/71  Pulse 85  Temp 99.1 F (37.3 C) (Oral)  Resp 16  SpO2 100%  LMP 03/18/2012  Physical  Exam  Nursing note and vitals reviewed. Constitutional: She is oriented to person, place, and time. She appears well-developed and well-nourished. No distress.  HENT:  Head: Normocephalic and atraumatic.  Right Ear: External ear normal.  Left Ear: External ear normal.  Nose: Nose normal.  Mouth/Throat: Oropharynx is clear and moist. No oropharyngeal exudate.  Eyes: Conjunctivae and EOM are normal. Pupils are equal, round, and reactive to light. Right eye exhibits no discharge. Left eye exhibits no discharge.  Neck: Normal range of motion. Neck supple. No JVD present. No tracheal deviation present. No thyromegaly present.  Cardiovascular: Normal rate, regular rhythm, normal heart sounds and intact distal pulses.  Exam reveals no gallop and no friction rub.   No murmur heard. Pulmonary/Chest: Effort normal and breath sounds normal. No respiratory distress. She has no wheezes. She has no rales. She exhibits no tenderness.  Abdominal: Soft. Bowel sounds are normal. She exhibits no distension. There is tenderness in the right upper quadrant. There is positive Murphy's sign. There is no rigidity, no rebound, no guarding and no tenderness at McBurney's point.  Musculoskeletal: Normal range of motion.  Lymphadenopathy:    She has no cervical adenopathy.  Neurological: She is alert and oriented to person, place, and time. She has normal strength. No cranial nerve deficit. She displays a negative Romberg sign. Coordination normal.       Normal strength bilaterally normal sensation bilaterally normal cranial nerve exam no ataxia  Skin: Skin is warm. No rash noted. She is not diaphoretic.  Psychiatric: She has a normal mood and affect. Her behavior is normal. Judgment and thought content normal.    ED Course  Procedures (including critical care time)  Labs Reviewed  URINALYSIS, ROUTINE W REFLEX MICROSCOPIC - Abnormal; Notable for the following:    APPearance CLOUDY (*)     Hgb urine dipstick LARGE  (*)     Leukocytes, UA MODERATE (*)     All other components within normal limits  CBC WITH DIFFERENTIAL - Abnormal; Notable for the following:    RBC 5.63 (*)     MCV 70.9 (*)     MCH 22.6 (*)     All other components within normal limits  COMPREHENSIVE METABOLIC PANEL - Abnormal; Notable for the following:    Total Protein 9.1 (*)     All other components within normal limits  POCT PREGNANCY, URINE  URINE MICROSCOPIC-ADD ON  HCG, QUANTITATIVE, PREGNANCY   US Abdomen Complete  05/22/2012  *RADIOLOGY REPORT*  Clinical Data:  Right upper quadrant abdominal pain  COMPLETE ABDOMINAL ULTRASOUND  Comparison:  None.  Findings:  Gallbladder:  No gallstones, gallbladder wall thickening, or pericholecystic fluid.  Common bile duct:  Measures 3 mm, within normal limits.  Liver:  No focal lesion identified.  Within normal limits in parenchymal echogenicity.  IVC:  Appears normal.  Pancreas:  No focal abnormality seen.  Spleen:  Measures 8.6 cm.  No focal abnormality.  Normal echogenicity.  Right Kidney:  Measures 9.1 cm.  No  hydronephrosis or focal abnormality.  Left Kidney:  Measures 9.3 cm.  No hydronephrosis or focal abnormality.  Abdominal aorta:  No aneurysm identified.  Focused sonographic images in the right lower quadrant demonstrate no free fluid or other abnormality. However, the appendix is not identified.  IMPRESSION: Negative abdominal ultrasound.  Appendix not visualized.  Original Report Authenticated By: Waneta Martins, M.D.     1. Abdominal pain   2. Migraine       MDM  20 rule female patient with past medical history migraines has 2 chief complaints one is headache. He endorses headache patient describes as frontal aching ongoing for the past 3 weeks associated with light and exertion. Says is typical for migraines which usually gets no fevers no rashes no nausea no vomiting. No meningismus on my exam. Patient with normal neurological exam as detailed above. Likely this is an  exacerbation of her headaches. Will treat with Toradol metoclopramide Benadryl and IV fluids.  The second chief complaint is sudden onset right upper quadrant pain after eating. Patient says started yesterday after eating relieved with belching there is a Murphy's sign but no peritonitis membranes point tenderness. Patient not complaining of dysuria dyspareunia vaginal discharge vaginal bleeding. Patient says that she thinks she had a positive pregnancy test the pregnancy test here is negative. Blood hCG is negative. No vaginal bleeding at this time. Patient's abdominal labs are normal but we'll send for ultrasound. Ultrasound is normal.  Results for orders placed during the hospital encounter of 05/22/12  URINALYSIS, ROUTINE W REFLEX MICROSCOPIC      Component Value Range   Color, Urine YELLOW  YELLOW   APPearance CLOUDY (*) CLEAR   Specific Gravity, Urine 1.020  1.005 - 1.030   pH 6.0  5.0 - 8.0   Glucose, UA NEGATIVE  NEGATIVE mg/dL   Hgb urine dipstick LARGE (*) NEGATIVE   Bilirubin Urine NEGATIVE  NEGATIVE   Ketones, ur NEGATIVE  NEGATIVE mg/dL   Protein, ur NEGATIVE  NEGATIVE mg/dL   Urobilinogen, UA 0.2  0.0 - 1.0 mg/dL   Nitrite NEGATIVE  NEGATIVE   Leukocytes, UA MODERATE (*) NEGATIVE  POCT PREGNANCY, URINE      Component Value Range   Preg Test, Ur NEGATIVE  NEGATIVE  URINE MICROSCOPIC-ADD ON      Component Value Range   Squamous Epithelial / LPF RARE  RARE   WBC, UA 7-10  <3 WBC/hpf   RBC / HPF 0-2  <3 RBC/hpf   Bacteria, UA RARE  RARE   Urine-Other AMORPHOUS URATES/PHOSPHATES    HCG, QUANTITATIVE, PREGNANCY      Component Value Range   hCG, Beta Chain, Quant, S <1  <5 mIU/mL  CBC WITH DIFFERENTIAL      Component Value Range   WBC 10.4  4.0 - 10.5 K/uL   RBC 5.63 (*) 3.87 - 5.11 MIL/uL   Hemoglobin 12.7  12.0 - 15.0 g/dL   HCT 29.5  62.1 - 30.8 %   MCV 70.9 (*) 78.0 - 100.0 fL   MCH 22.6 (*) 26.0 - 34.0 pg   MCHC 31.8  30.0 - 36.0 g/dL   RDW 65.7  84.6 - 96.2 %    Platelets 283  150 - 400 K/uL   Neutrophils Relative 69  43 - 77 %   Lymphocytes Relative 24  12 - 46 %   Monocytes Relative 4  3 - 12 %   Eosinophils Relative 3  0 - 5 %   Basophils Relative  0  0 - 1 %   Neutro Abs 7.2  1.7 - 7.7 K/uL   Lymphs Abs 2.5  0.7 - 4.0 K/uL   Monocytes Absolute 0.4  0.1 - 1.0 K/uL   Eosinophils Absolute 0.3  0.0 - 0.7 K/uL   Basophils Absolute 0.0  0.0 - 0.1 K/uL  COMPREHENSIVE METABOLIC PANEL      Component Value Range   Sodium 139  135 - 145 mEq/L   Potassium 4.1  3.5 - 5.1 mEq/L   Chloride 100  96 - 112 mEq/L   CO2 26  19 - 32 mEq/L   Glucose, Bld 87  70 - 99 mg/dL   BUN 7  6 - 23 mg/dL   Creatinine, Ser 1.61  0.50 - 1.10 mg/dL   Calcium 09.6  8.4 - 04.5 mg/dL   Total Protein 9.1 (*) 6.0 - 8.3 g/dL   Albumin 4.5  3.5 - 5.2 g/dL   AST 23  0 - 37 U/L   ALT 15  0 - 35 U/L   Alkaline Phosphatase 64  39 - 117 U/L   Total Bilirubin 0.3  0.3 - 1.2 mg/dL   GFR calc non Af Amer >90  >90 mL/min   GFR calc Af Amer >90  >90 mL/min   US Abdomen Complete (Final result)   Result time:05/22/12 2152    Final result by Rad Results In Interface (05/22/12 21:52:22)    Narrative:   *RADIOLOGY REPORT*  Clinical Data: Right upper quadrant abdominal pain  COMPLETE ABDOMINAL ULTRASOUND  Comparison: None.  Findings:  Gallbladder: No gallstones, gallbladder wall thickening, or pericholecystic fluid.  Common bile duct: Measures 3 mm, within normal limits.  Liver: No focal lesion identified. Within normal limits in parenchymal echogenicity.  IVC: Appears normal.  Pancreas: No focal abnormality seen.  Spleen: Measures 8.6 cm. No focal abnormality. Normal echogenicity.  Right Kidney: Measures 9.1 cm. No hydronephrosis or focal abnormality.  Left Kidney: Measures 9.3 cm. No hydronephrosis or focal abnormality.  Abdominal aorta: No aneurysm identified.  Focused sonographic images in the right lower quadrant demonstrate no free fluid or other  abnormality. However, the appendix is not identified.  IMPRESSION: Negative abdominal ultrasound.  Appendix not visualized.  Original Report Authenticated By: Waneta Martins, M.D.     Pain completely relieved with Toradol Benadryl metoclopramide and IV fluids. Patient appears comfortable tolerating by mouth is ready for discharge  Case discussed with Dr. Blanche East, MD 05/22/12 [phone]

## 2012-05-22 NOTE — ED Notes (Signed)
Pt st's she has had headache x's 3 weeks, st's her eyes are burning and itching. Also c/o right flank pain.  Nausea without vomiting

## 2012-05-23 NOTE — ED Provider Notes (Signed)
Medical screening examination/treatment/procedure(s) were performed by non-physician practitioner and as supervising physician I was immediately available for consultation/collaboration.   Dione Booze, MD 05/23/12 [phone]

## 2012-09-06 ENCOUNTER — Other Ambulatory Visit: Payer: Self-pay | Admitting: Family

## 2013-01-14 ENCOUNTER — Encounter (HOSPITAL_COMMUNITY): Payer: Self-pay | Admitting: *Deleted

## 2013-01-14 ENCOUNTER — Inpatient Hospital Stay (HOSPITAL_COMMUNITY)
Admission: AD | Admit: 2013-01-14 | Discharge: 2013-01-14 | Disposition: A | Discharge status: Home / Self Care | Payer: Self-pay | Source: Ambulatory Visit | Attending: Obstetrics and Gynecology | Admitting: Obstetrics and Gynecology

## 2013-01-14 DIAGNOSIS — N760 Acute vaginitis: Secondary | ICD-10-CM | POA: Insufficient documentation

## 2013-01-14 DIAGNOSIS — B9689 Other specified bacterial agents as the cause of diseases classified elsewhere: Secondary | ICD-10-CM

## 2013-01-14 DIAGNOSIS — A499 Bacterial infection, unspecified: Secondary | ICD-10-CM | POA: Insufficient documentation

## 2013-01-14 DIAGNOSIS — R109 Unspecified abdominal pain: Secondary | ICD-10-CM

## 2013-01-14 DIAGNOSIS — Z3202 Encounter for pregnancy test, result negative: Secondary | ICD-10-CM | POA: Insufficient documentation

## 2013-01-14 LAB — CBC WITH DIFFERENTIAL/PLATELET
Basophils Absolute: 0 10*3/uL (ref 0.0–0.1)
Basophils Relative: 0 % (ref 0–1)
Hemoglobin: 10.9 g/dL — ABNORMAL LOW (ref 12.0–15.0)
MCHC: 31.5 g/dL (ref 30.0–36.0)
Neutro Abs: 5.8 10*3/uL (ref 1.7–7.7)
Neutrophils Relative %: 65 % (ref 43–77)
RDW: 15.5 % (ref 11.5–15.5)
WBC: 8.9 10*3/uL (ref 4.0–10.5)

## 2013-01-14 LAB — URINALYSIS, ROUTINE W REFLEX MICROSCOPIC
Bilirubin Urine: NEGATIVE
Ketones, ur: NEGATIVE mg/dL
Nitrite: NEGATIVE
Protein, ur: NEGATIVE mg/dL
pH: 6 (ref 5.0–8.0)

## 2013-01-14 LAB — WET PREP, GENITAL: Yeast Wet Prep HPF POC: NONE SEEN

## 2013-01-14 LAB — HCG, QUANTITATIVE, PREGNANCY: hCG, Beta Chain, Quant, S: <1 m[IU]/mL (ref <5)

## 2013-01-14 MED ORDER — METRONIDAZOLE 500 MG PO TABS: 500.0000 mg | ORAL_TABLET | Freq: Two times a day (BID) | ORAL | Status: DC

## 2013-01-14 MED ORDER — PROMETHAZINE HCL 25 MG PO TABS: 12.5000 mg | ORAL_TABLET | Freq: Four times a day (QID) | ORAL | Status: DC | PRN

## 2013-01-14 NOTE — MAU Provider Note (Signed)
History     CSN: [id number]  Arrival date and time: 01/14/13 8295   First Provider Initiated Contact with Patient 01/14/13 0217      Chief Complaint  Patient presents with  . Abdominal Cramping  . Nausea  . Vaginal Discharge  . Dysuria   HPI Laura Hernandez is a 22 y.o. female who presents to MAU with abdominal pain. The pain started about a week ago. She describes the pain as cramping. She rates the pain as 8/10. The pain is located in the lower mid abdomen. She has taken nothing for pain. Associated symptoms include nausea, vaginal discharge and burning on urination. She also reports having had a positive home pregnancy test. Patient's last menstrual period was 12/18/2012.  She uses nothing for birth control. She states she doesn't use birth control because she doesn't think she can get pregnant. Last sexual intercourse was yesterday.  She has a history of Chlamydia and trichomonas.  The history was provided by the patient.   OB History   Grav Para Term Preterm Abortions TAB SAB Ect Mult Living   0               Past Medical History  Diagnosis Date  . Chlamydia   . Trichomonas   . Migraines   . Bronchitis     Past Surgical History  Procedure Laterality Date  . No past surgeries      Family History  Problem Relation Age of Onset  . Asthma Mother   . Arthritis Mother   . Diabetes Mother   . Heart disease Mother   . Hypertension Mother   . Stroke Mother   . Kidney disease Mother   . Vision loss Mother   . Diabetes Maternal Grandmother   . Hypertension Maternal Grandmother     History  Substance Use Topics  . Smoking status: Never Smoker   . Smokeless tobacco: Former Neurosurgeon  . Alcohol Use: Yes     Comment: socially    Allergies:  Allergies  Allergen Reactions  . Latex Itching and Rash    Prescriptions prior to admission  Medication Sig Dispense Refill  . acetaminophen (TYLENOL) 500 MG tablet Take 500 mg by mouth every 6 (six) hours as needed. For pain       . albuterol (PROVENTIL HFA;VENTOLIN HFA) 108 (90 BASE) MCG/ACT inhaler Inhale 2 puffs into the lungs every 4 (four) hours as needed. For shortness of breath      . metroNIDAZOLE (FLAGYL) 500 MG tablet TAKE ONE TABLET BY MOUTH TWICE DAILY  14 tablet  0    Review of Systems  Constitutional: Negative for fever and chills.  Eyes: Negative for blurred vision.  Respiratory: Negative for cough and wheezing.   Cardiovascular: Negative for chest pain and palpitations.  Gastrointestinal: Positive for nausea and abdominal pain. Negative for vomiting.  Genitourinary: Positive for dysuria, urgency and frequency.  Musculoskeletal: Positive for back pain.  Neurological: Negative for dizziness and headaches.  Psychiatric/Behavioral: Negative for depression. The patient is not nervous/anxious.    Blood pressure 122/76, pulse 79, temperature 98.3 F (36.8 C), temperature source Oral, resp. rate 16, height 5' (1.524 m), weight 163 lb 3.2 oz (74.027 kg), last menstrual period 12/18/2012.  Physical Exam  Constitutional: She is oriented to person, place, and time. She appears well-developed and well-nourished. No distress.  HENT:  Head: Normocephalic and atraumatic.  Eyes: EOM are normal.  Neck: Neck supple.  Cardiovascular: Normal rate.   Respiratory: Effort normal.  GI: Soft. Bowel sounds are normal. There is tenderness in the suprapubic area. There is no rebound, no guarding and no CVA tenderness.  Genitourinary:  External genitalia without lesions. White discharge vaginal vault. Cervix long, closed, no CMT, mild bilateral adnexal tenderness. Uterus without palpable enlargement.  Musculoskeletal: Normal range of motion.  Neurological: She is alert and oriented to person, place, and time.  Skin: Skin is warm and dry.  Psychiatric: She has a normal mood and affect. Her behavior is normal. Judgment and thought content normal.   Results for orders placed during the hospital encounter of 01/14/13 (from  the past 24 hour(s))  URINALYSIS, ROUTINE W REFLEX MICROSCOPIC     Status: None   Collection Time    01/14/13  1:05 AM      Result Value Range   Color, Urine YELLOW  YELLOW   APPearance CLEAR  CLEAR   Specific Gravity, Urine 1.020  1.005 - 1.030   pH 6.0  5.0 - 8.0   Glucose, UA NEGATIVE  NEGATIVE mg/dL   Hgb urine dipstick NEGATIVE  NEGATIVE   Bilirubin Urine NEGATIVE  NEGATIVE   Ketones, ur NEGATIVE  NEGATIVE mg/dL   Protein, ur NEGATIVE  NEGATIVE mg/dL   Urobilinogen, UA 0.2  0.0 - 1.0 mg/dL   Nitrite NEGATIVE  NEGATIVE   Leukocytes, UA NEGATIVE  NEGATIVE  POCT PREGNANCY, URINE     Status: None   Collection Time    01/14/13  1:15 AM      Result Value Range   Preg Test, Ur NEGATIVE  NEGATIVE  WET PREP, GENITAL     Status: Abnormal   Collection Time    01/14/13  2:10 AM      Result Value Range   Yeast Wet Prep HPF POC NONE SEEN  NONE SEEN   Trich, Wet Prep NONE SEEN  NONE SEEN   Clue Cells Wet Prep HPF POC FEW (*) NONE SEEN   WBC, Wet Prep HPF POC FEW (*) NONE SEEN  CBC WITH DIFFERENTIAL     Status: Abnormal   Collection Time    01/14/13  2:12 AM      Result Value Range   WBC 8.9  4.0 - 10.5 K/uL   RBC 5.05  3.87 - 5.11 MIL/uL   Hemoglobin 10.9 (*) 12.0 - 15.0 g/dL   HCT 16.1 (*) 09.6 - 04.5 %   MCV 68.5 (*) 78.0 - 100.0 fL   MCH 21.6 (*) 26.0 - 34.0 pg   MCHC 31.5  30.0 - 36.0 g/dL   RDW 40.9  81.1 - 91.4 %   Platelets 308  150 - 400 K/uL   Neutrophils Relative 65  43 - 77 %   Neutro Abs 5.8  1.7 - 7.7 K/uL   Lymphocytes Relative 27  12 - 46 %   Lymphs Abs 2.4  0.7 - 4.0 K/uL   Monocytes Relative 6  3 - 12 %   Monocytes Absolute 0.5  0.1 - 1.0 K/uL   Eosinophils Relative 2  0 - 5 %   Eosinophils Absolute 0.2  0.0 - 0.7 K/uL   Basophils Relative 0  0 - 1 %   Basophils Absolute 0.0  0.0 - 0.1 K/uL  HCG, QUANTITATIVE, PREGNANCY     Status: None   Collection Time    01/14/13  2:12 AM      Result Value Range   hCG, Beta Chain, Quant, S <1  <5 mIU/mL     Assessment: 21 y.o.  female with abdominal pain   Vaginal discharge, bacterial vaginosis  Plan:  Treat BV   Discussed follow up with GYN  MAU Course  Procedures   MDM 22 y.o. female who is not pregnant with vaginal discharge and nonspecific abdominal pain. Since her exam shows minimal tenderness without guarding or rebound I feel she is stable for discharge home with treatment for her BV. She will return if symptoms worsen.  Discussed with the patient and all questioned fully answered.    Medication List    TAKE these medications       acetaminophen 500 MG tablet  Commonly known as:  TYLENOL  Take 500 mg by mouth every 6 (six) hours as needed. For pain     albuterol 108 (90 BASE) MCG/ACT inhaler  Commonly known as:  PROVENTIL HFA;VENTOLIN HFA  Inhale 2 puffs into the lungs every 4 (four) hours as needed. For shortness of breath     metroNIDAZOLE 500 MG tablet  Commonly known as:  FLAGYL  Take 1 tablet (500 mg total) by mouth 2 (two) times daily.     promethazine 25 MG tablet  Commonly known as:  PHENERGAN  Take 0.5 tablets (12.5 mg total) by mouth every 6 (six) hours as needed for nausea.        NEESE,HOPE, RN, FNP, [facility] 01/14/2013, 2:23 AM

## 2013-01-14 NOTE — MAU Note (Signed)
Pt LMP 12/18/2012, +UPT at home.  Having vaginal bleeding on  And off.  Vaginal discharge with an odor.  Burning with urination x 1 day.  Nausea x 1 wk.

## 2013-01-15 NOTE — MAU Provider Note (Signed)
Attestation of Attending Supervision of Advanced Practitioner: Evaluation and management procedures were performed by the PA/NP/CNM/OB Fellow under my supervision/collaboration. Chart reviewed and agree with management and plan.  Reo Portela V 01/15/2013 9:05 PM

## 2013-01-16 LAB — GC/CHLAMYDIA PROBE AMP: GC Probe RNA: NEGATIVE

## 2013-01-17 ENCOUNTER — Telehealth (HOSPITAL_COMMUNITY): Payer: Self-pay | Admitting: Nurse Practitioner

## 2013-01-17 ENCOUNTER — Encounter: Payer: Self-pay | Admitting: Obstetrics and Gynecology

## 2013-01-17 ENCOUNTER — Other Ambulatory Visit: Payer: Self-pay | Admitting: Obstetrics and Gynecology

## 2013-01-17 ENCOUNTER — Encounter (HOSPITAL_COMMUNITY): Payer: Self-pay | Admitting: *Deleted

## 2013-01-17 MED ORDER — AZITHROMYCIN 500 MG PO TABS: 1000.0000 mg | ORAL_TABLET | Freq: Once | ORAL | Status: DC

## 2013-01-17 NOTE — Telephone Encounter (Signed)
Telephone call to patient regarding positive chlamydia culture, patient's phone is not in service.  Certified letter mailed.  Patient has not been treated and will need referral to [facility] STD clinic at [phone].  Report faxed to health department.

## 2013-03-25 ENCOUNTER — Emergency Department (HOSPITAL_COMMUNITY)
Admission: EM | Admit: 2013-03-25 | Discharge: 2013-03-26 | Disposition: A | Discharge status: Home / Self Care | Payer: Self-pay | Attending: Emergency Medicine | Admitting: Emergency Medicine

## 2013-03-25 ENCOUNTER — Encounter (HOSPITAL_COMMUNITY): Payer: Self-pay

## 2013-03-25 DIAGNOSIS — Z8619 Personal history of other infectious and parasitic diseases: Secondary | ICD-10-CM | POA: Insufficient documentation

## 2013-03-25 DIAGNOSIS — N730 Acute parametritis and pelvic cellulitis: Secondary | ICD-10-CM

## 2013-03-25 DIAGNOSIS — Z3202 Encounter for pregnancy test, result negative: Secondary | ICD-10-CM | POA: Insufficient documentation

## 2013-03-25 DIAGNOSIS — Z8709 Personal history of other diseases of the respiratory system: Secondary | ICD-10-CM | POA: Insufficient documentation

## 2013-03-25 DIAGNOSIS — N719 Inflammatory disease of uterus, unspecified: Secondary | ICD-10-CM | POA: Insufficient documentation

## 2013-03-25 DIAGNOSIS — H109 Unspecified conjunctivitis: Secondary | ICD-10-CM | POA: Insufficient documentation

## 2013-03-25 LAB — URINALYSIS, ROUTINE W REFLEX MICROSCOPIC
Ketones, ur: NEGATIVE mg/dL
Nitrite: NEGATIVE
Specific Gravity, Urine: 1.03 (ref 1.005–1.030)
pH: 6 (ref 5.0–8.0)

## 2013-03-25 LAB — URINE MICROSCOPIC-ADD ON

## 2013-03-25 NOTE — ED Notes (Signed)
Pt was lighted headed and felt faint when drawing blood.  Pt moved repeatedly and interrupted blood draw.  Pt had difficulty earlier with blood draw.  Informed RN of blood draw.

## 2013-03-25 NOTE — ED Notes (Signed)
POCT urine preg resulted neg.

## 2013-03-25 NOTE — ED Notes (Signed)
Pt complains of a headache, abd pain, and general body aches for one week. Also states that she's been coughing mucous and her chest hurts when she coughs

## 2013-03-26 LAB — CBC WITH DIFFERENTIAL/PLATELET
Eosinophils Relative: 3 % (ref 0–5)
Lymphocytes Relative: 23 % (ref 12–46)
Monocytes Absolute: 0.7 10*3/uL (ref 0.1–1.0)
Monocytes Relative: 7 % (ref 3–12)
Neutrophils Relative %: 67 % (ref 43–77)
Platelets: 310 10*3/uL (ref 150–400)
RBC: 5.53 MIL/uL — ABNORMAL HIGH (ref 3.87–5.11)
WBC: 9.9 10*3/uL (ref 4.0–10.5)

## 2013-03-26 LAB — BASIC METABOLIC PANEL
GFR calc Af Amer: >90 mL/min (ref >90)
GFR calc non Af Amer: >90 mL/min (ref >90)
Potassium: 4 mEq/L (ref 3.5–5.1)
Sodium: 136 mEq/L (ref 135–145)

## 2013-03-26 LAB — WET PREP, GENITAL: Trich, Wet Prep: NONE SEEN

## 2013-03-26 MED ORDER — OXYCODONE-ACETAMINOPHEN 5-325 MG PO TABS
1.0000 | ORAL_TABLET | Freq: Once | ORAL | Status: AC
  Administered 2013-03-26: 1 via ORAL
  Filled 2013-03-26: qty 1

## 2013-03-26 MED ORDER — DOXYCYCLINE HYCLATE 100 MG PO CAPS: 100.0000 mg | ORAL_CAPSULE | Freq: Two times a day (BID) | ORAL | Status: DC

## 2013-03-26 MED ORDER — HYDROCODONE-ACETAMINOPHEN 5-325 MG PO TABS: 1.0000 | ORAL_TABLET | Freq: Four times a day (QID) | ORAL | Status: DC | PRN

## 2013-03-26 MED ORDER — CEFTRIAXONE SODIUM 1 G IJ SOLR
1.0000 g | Freq: Once | INTRAMUSCULAR | Status: AC
  Administered 2013-03-26: 1 g via INTRAMUSCULAR
  Filled 2013-03-26: qty 10

## 2013-03-26 MED ORDER — AZITHROMYCIN 1 G PO PACK
1.0000 g | PACK | Freq: Once | ORAL | Status: AC
  Administered 2013-03-26: 1 g via ORAL
  Filled 2013-03-26: qty 1

## 2013-03-26 MED ORDER — ERYTHROMYCIN 5 MG/GM OP OINT: TOPICAL_OINTMENT | Freq: Every day | OPHTHALMIC | Status: DC

## 2013-03-26 NOTE — ED Notes (Signed)
Pelvic supplies at bedside. 

## 2013-03-26 NOTE — ED Provider Notes (Signed)
History     CSN: [id number]  Arrival date & time 03/25/13  2136   First MD Initiated Contact with Patient 03/26/13 0049      Chief Complaint  Patient presents with  . Headache  . Abdominal Pain    (Consider location/radiation/quality/duration/timing/severity/associated sxs/prior treatment) Patient is a 22 y.o. female presenting with abdominal pain and eye pain. The history is provided by the patient. No language interpreter was used.  Abdominal Pain This is a new problem. The current episode started more than 2 days ago. The problem occurs constantly. The problem has not changed since onset.Associated symptoms include abdominal pain. Pertinent negatives include no chest pain, no headaches and no shortness of breath. Nothing aggravates the symptoms. Nothing relieves the symptoms. She has tried nothing for the symptoms. The treatment provided no relief.  Eye Pain This is a new problem. The current episode started 2 days ago. The problem occurs constantly. The problem has not changed since onset.Associated symptoms include abdominal pain. Pertinent negatives include no chest pain, no headaches and no shortness of breath. Nothing aggravates the symptoms. Nothing relieves the symptoms. She has tried nothing for the symptoms. The treatment provided no relief.    Past Medical History  Diagnosis Date  . Chlamydia   . Trichomonas   . Migraines   . Bronchitis     Past Surgical History  Procedure Laterality Date  . No past surgeries      Family History  Problem Relation Age of Onset  . Asthma Mother   . Arthritis Mother   . Diabetes Mother   . Heart disease Mother   . Hypertension Mother   . Stroke Mother   . Kidney disease Mother   . Vision loss Mother   . Diabetes Maternal Grandmother   . Hypertension Maternal Grandmother     History  Substance Use Topics  . Smoking status: Never Smoker   . Smokeless tobacco: Former Neurosurgeon  . Alcohol Use: Yes     Comment: socially    OB  History   Grav Para Term Preterm Abortions TAB SAB Ect Mult Living   0               Review of Systems  Eyes: Positive for pain.  Respiratory: Negative for shortness of breath.   Cardiovascular: Negative for chest pain.  Gastrointestinal: Positive for abdominal pain.  Genitourinary: Positive for vaginal discharge.  Neurological: Negative for headaches.  All other systems reviewed and are negative.    Allergies  Latex  Home Medications   Current Outpatient Rx  Name  Route  Sig  Dispense  Refill  . acetaminophen (TYLENOL) 500 MG tablet   Oral   Take 500 mg by mouth every 6 (six) hours as needed. For pain         . diphenhydrAMINE (BENADRYL) 25 mg capsule   Oral   Take 25 mg by mouth every 6 (six) hours as needed for itching or allergies.         . promethazine (PHENERGAN) 25 MG tablet   Oral   Take 0.5 tablets (12.5 mg total) by mouth every 6 (six) hours as needed for nausea.   20 tablet   0     BP 111/67  Pulse 84  Temp(Src) 99 F (37.2 C) (Oral)  Resp 20  Ht 5' (1.524 m)  Wt 166 lb (75.297 kg)  BMI 32.42 kg/m2  SpO2 99%  LMP 03/04/2013  Physical Exam  Constitutional: She is oriented to person,  place, and time. She appears well-developed and well-nourished.  HENT:  Head: Normocephalic and atraumatic.  Mouth/Throat: Oropharynx is clear and moist.  Eyes: EOM are normal. Pupils are equal, round, and reactive to light.  Injected conjunctiva  Neck: Normal range of motion. Neck supple.  Cardiovascular: Normal rate, regular rhythm and intact distal pulses.   Pulmonary/Chest: Effort normal and breath sounds normal. She has no wheezes. She has no rales.  Abdominal: Soft. Bowel sounds are normal. There is no tenderness. There is no rebound and no guarding.  Genitourinary: Vaginal discharge found.  cmt  Musculoskeletal: Normal range of motion.  Lymphadenopathy:    She has no cervical adenopathy.  Neurological: She is alert and oriented to person, place, and  time. She has normal reflexes. No cranial nerve deficit.  Skin: Skin is warm and dry.  Psychiatric: She has a normal mood and affect.    ED Course  Procedures (including critical care time)  Labs Reviewed  CBC WITH DIFFERENTIAL - Abnormal; Notable for the following:    RBC 5.53 (*)    Hemoglobin 11.5 (*)    MCV 66.7 (*)    MCH 20.8 (*)    All other components within normal limits  URINALYSIS, ROUTINE W REFLEX MICROSCOPIC - Abnormal; Notable for the following:    APPearance CLOUDY (*)    Leukocytes, UA SMALL (*)    All other components within normal limits  URINE MICROSCOPIC-ADD ON - Abnormal; Notable for the following:    Squamous Epithelial / LPF FEW (*)    All other components within normal limits  BASIC METABOLIC PANEL - Abnormal; Notable for the following:    Glucose, Bld 103 (*)    All other components within normal limits  WET PREP, GENITAL  GC/CHLAMYDIA PROBE AMP  POCT PREGNANCY, URINE   No results found.   No diagnosis found.    MDM  Consistent with PID, discharge was mucopurulent.  Condoms for one month no sexual activity until all partners treated patient verbalizes understanding and agrees to follow up       Lateef Juncaj Smitty Cords, MD 03/26/13 0222

## 2013-03-27 ENCOUNTER — Telehealth (HOSPITAL_COMMUNITY): Payer: Self-pay | Admitting: Emergency Medicine

## 2013-03-27 NOTE — Care Management (Signed)
03/27/2013 18:50 W,. Aundria Rud RN BSN Pt came in to ED and  given [facility] letter

## 2013-03-27 NOTE — Progress Notes (Signed)
   CARE MANAGEMENT ED NOTE 03/27/2013  Patient:  Laura Hernandez, Laura Hernandez   Account Number:  [id number]  Date Initiated:  03/27/2013  Documentation initiated by:  [facility]  Subjective/Objective Assessment:     Subjective/Objective Assessment Detail:   Phone call for Medication Assistance [facility])     Action/Plan:   [facility] PROGRAM   Action/Plan Detail:   Anticipated DC Date:  03/27/2013     Status Recommendation to Physician:   Result of Recommendation:    Other ED Services  Consult Working Plan    DC Planning Services  [facility] Program    Choice offered to / List presented to:  C-1 Patient          Status of service:  Completed, signed off  ED Comments:   ED Comments Detail:  RECEIVED PHONE CALL FROM [facility] GAMMONS RN IN ED,CONCERNING PATIENT NOT ABLE TO AFFORD MEDICATIONS. RETURNED PHONE CALL SPOKE WITH PT, SHE STATES , SHE WAS SEEN AT [facility] ED ON 03/25/2013 , AND IS  UNEMPLOYED AT TIME AND CAN'T PAY WHAT THE PHARMACY IS ASKING. DISCUSSED THE MATCH PROGRAM AND EXPLAINED IT IS A ONE TIME ASSISTANCE MED PROGRAM WITH A $3.00 CO-PAY PER PERSCRIPTION. PT STATES, SHE WILL BE ABLE TO AFFORD  THE CO-PAY.  PT WAS ENROLLED INTO THE PROGRAM. PT STATES SHE  WILL COME TO [facility] ED TO PICK UP MATCH LETTER.  [facility] RN

## 2013-03-27 NOTE — Progress Notes (Signed)
Received call from ED Nurse Gae Gallop Gammons about pt needing medication assistance with Doxycicline. Attempted to contact patient at number provided [phone], Unable to reach or leave voicemail.  Will make another attempt  Beecher Mcardle RN BSN

## 2013-04-19 ENCOUNTER — Other Ambulatory Visit: Payer: Self-pay | Admitting: Advanced Practice Midwife

## 2013-08-16 ENCOUNTER — Encounter (HOSPITAL_COMMUNITY): Payer: Self-pay | Admitting: Emergency Medicine

## 2013-08-16 ENCOUNTER — Emergency Department (HOSPITAL_COMMUNITY)
Admission: EM | Admit: 2013-08-16 | Discharge: 2013-08-16 | Disposition: A | Discharge status: Home / Self Care | Payer: Self-pay | Attending: Emergency Medicine | Admitting: Emergency Medicine

## 2013-08-16 DIAGNOSIS — Z8679 Personal history of other diseases of the circulatory system: Secondary | ICD-10-CM | POA: Insufficient documentation

## 2013-08-16 DIAGNOSIS — R110 Nausea: Secondary | ICD-10-CM | POA: Insufficient documentation

## 2013-08-16 DIAGNOSIS — Z8709 Personal history of other diseases of the respiratory system: Secondary | ICD-10-CM | POA: Insufficient documentation

## 2013-08-16 DIAGNOSIS — Z3201 Encounter for pregnancy test, result positive: Secondary | ICD-10-CM | POA: Insufficient documentation

## 2013-08-16 DIAGNOSIS — R5381 Other malaise: Secondary | ICD-10-CM | POA: Insufficient documentation

## 2013-08-16 DIAGNOSIS — Z91040 Latex allergy status: Secondary | ICD-10-CM | POA: Insufficient documentation

## 2013-08-16 DIAGNOSIS — Z8619 Personal history of other infectious and parasitic diseases: Secondary | ICD-10-CM | POA: Insufficient documentation

## 2013-08-16 DIAGNOSIS — R51 Headache: Secondary | ICD-10-CM | POA: Insufficient documentation

## 2013-08-16 MED ORDER — ONDANSETRON HCL 4 MG PO TABS: 4.0000 mg | ORAL_TABLET | Freq: Four times a day (QID) | ORAL | Status: DC

## 2013-08-16 MED ORDER — PRENATAL COMPLETE 14-0.4 MG PO TABS: 1.0000 | ORAL_TABLET | Freq: Every day | ORAL | Status: DC

## 2013-08-16 NOTE — ED Provider Notes (Signed)
CSN: [id number]     Arrival date & time 08/16/13  1721 History   This chart was scribed for non-physician practitioner Marlon Pel working with Enid Skeens, MD by Carl Best, ED Scribe. This patient was seen in room TR05C/TR05C and the patient's care was started at 6:09 PM.     Chief Complaint  Patient presents with  . Possible Pregnancy    The history is provided by the patient. No language interpreter was used.   HPI Comments: Laura Hernandez is a 22 y.o. female who presents to the Emergency Department needing a confirmation of pregnancy.  The patient states that she took a pregnancy test at home and it was positive.   She lists nausea, fatigue, abdominal cramps and headache as associated symptoms.  She states that she took tylenol for her headache with no relief of her symptoms.  She states that her LNMP was September 15-16th, 2014.  The patient denies taking birth control.  She states that she was using contraceptives intermittently with her partner.  The patient denies having a history of pregnancy.    Past Medical History  Diagnosis Date  . Chlamydia   . Trichomonas   . Migraines   . Bronchitis    Past Surgical History  Procedure Laterality Date  . No past surgeries     Family History  Problem Relation Age of Onset  . Asthma Mother   . Arthritis Mother   . Diabetes Mother   . Heart disease Mother   . Hypertension Mother   . Stroke Mother   . Kidney disease Mother   . Vision loss Mother   . Diabetes Maternal Grandmother   . Hypertension Maternal Grandmother    History  Substance Use Topics  . Smoking status: Never Smoker   . Smokeless tobacco: Former Neurosurgeon  . Alcohol Use: Yes     Comment: socially   OB History   Grav Para Term Preterm Abortions TAB SAB Ect Mult Living   0              Review of Systems  Constitutional: Positive for fatigue.  Gastrointestinal: Positive for nausea.  Neurological: Positive for headaches.  All other systems reviewed and  are negative.    Allergies  Latex  Home Medications   Current Outpatient Rx  Name  Route  Sig  Dispense  Refill  . acetaminophen (TYLENOL) 500 MG tablet   Oral   Take 1,000 mg by mouth 2 (two) times daily as needed for pain.          . diphenhydrAMINE (BENADRYL) 25 mg capsule   Oral   Take 25 mg by mouth 2 (two) times daily as needed for allergies.          Marland Kitchen ondansetron (ZOFRAN) 4 MG tablet   Oral   Take 1 tablet (4 mg total) by mouth every 6 (six) hours.   12 tablet   0   . Prenatal Vit-Fe Fumarate-FA (PRENATAL COMPLETE) 14-0.4 MG TABS   Oral   Take 1 tablet by mouth daily.   60 each   2    Triage Vitals: BP 123/78  Pulse 84  Temp(Src) 99 F (37.2 C) (Oral)  Resp 18  Ht 5' (1.524 m)  Wt 158 lb 9.6 oz (71.94 kg)  BMI 30.97 kg/m2  SpO2 99%  Physical Exam  Nursing note and vitals reviewed. Constitutional: She appears well-developed and well-nourished. No distress.  HENT:  Head: Normocephalic and atraumatic.  Eyes: Pupils  are equal, round, and reactive to light.  Neck: Normal range of motion. Neck supple.  Cardiovascular: Normal rate and regular rhythm.   Pulmonary/Chest: Effort normal.  Abdominal: Soft.  Neurological: She is alert.  Skin: Skin is warm and dry.    ED Course  Procedures (including critical care time)  DIAGNOSTIC STUDIES: Oxygen Saturation is 99% on room air, normal by my interpretation.    COORDINATION OF CARE: 6:10 PM- Discussed discharging the patient with a referral to the women's clinic and pre-natal vitamins.  Advised the patient to drink plenty of fluids and eat regularly.  The patient agreed to the treatment plan.     Labs Review Labs Reviewed  POCT PREGNANCY, URINE - Abnormal; Notable for the following:    Preg Test, Ur POSITIVE (*)    All other components within normal limits   Imaging Review No results found.  EKG Interpretation   None       MDM   1. Positive pregnancy test    No concern with abdominal  pain or sickness, purely wants confirmation of pregnancy.   21 y.o.Myrakle Wingler Buzby's evaluation in the Emergency Department is complete. It has been determined that no acute conditions requiring further emergency intervention are present at this time. The patient/guardian have been advised of the diagnosis and plan. We have discussed signs and symptoms that warrant return to the ED, such as changes or worsening in symptoms.  Vital signs are stable at discharge. Filed Vitals:   08/16/13 1804  BP: 123/78  Pulse:   Temp:   Resp:     Patient/guardian has voiced understanding and agreed to follow-up with the PCP or specialist.  I personally performed the services described in this documentation, which was scribed in my presence. The recorded information has been reviewed and is accurate.    Dorthula Matas, PA-C 08/16/13 2251

## 2013-08-16 NOTE — ED Notes (Signed)
LMP 07/08/13 here for pregnancy test. States under mild stress and took at home test which was positive.

## 2013-08-17 NOTE — ED Provider Notes (Signed)
Medical screening examination/treatment/procedure(s) were performed by non-physician practitioner and as supervising physician I was immediately available for consultation/collaboration.  EKG Interpretation   None         Enid Skeens, MD 08/17/13 [phone]

## 2013-09-18 ENCOUNTER — Encounter: Payer: Self-pay | Admitting: Advanced Practice Midwife

## 2013-09-18 ENCOUNTER — Ambulatory Visit (INDEPENDENT_AMBULATORY_CARE_PROVIDER_SITE_OTHER): Payer: Self-pay | Admitting: Advanced Practice Midwife

## 2013-09-18 VITALS — BP 114/73 | Temp 98.0°F | Wt 157.0 lb

## 2013-09-18 DIAGNOSIS — A749 Chlamydial infection, unspecified: Secondary | ICD-10-CM | POA: Insufficient documentation

## 2013-09-18 DIAGNOSIS — Z8742 Personal history of other diseases of the female genital tract: Secondary | ICD-10-CM

## 2013-09-18 DIAGNOSIS — Z8619 Personal history of other infectious and parasitic diseases: Secondary | ICD-10-CM | POA: Insufficient documentation

## 2013-09-18 DIAGNOSIS — B009 Herpesviral infection, unspecified: Secondary | ICD-10-CM | POA: Insufficient documentation

## 2013-09-18 HISTORY — DX: Personal history of other infectious and parasitic diseases: Z86.19

## 2013-09-18 HISTORY — DX: Herpesviral infection, unspecified: B00.9

## 2013-09-18 MED ORDER — AZITHROMYCIN 1 G PO PACK: 1.0000 g | PACK | Freq: Once | ORAL | Status: DC

## 2013-09-18 NOTE — Progress Notes (Signed)
Pulse  80  Edema trace in feet.

## 2013-09-18 NOTE — Patient Instructions (Signed)
Pregnancy - First Trimester  During sexual intercourse, millions of sperm go into the vagina. Only 1 sperm will penetrate and fertilize the female egg while it is in the Fallopian tube. One week later, the fertilized egg implants into the wall of the uterus. An embryo begins to develop into a baby. At 6 to 8 weeks, the eyes and face are formed and the heartbeat can be seen on ultrasound. At the end of 12 weeks (first trimester), all the baby's organs are formed. Now that you are pregnant, you will want to do everything you can to have a healthy baby. Two of the most important things are to get good prenatal care and follow your caregiver's instructions. Prenatal care is all the medical care you receive before the baby's birth. It is given to prevent, find, and treat problems during the pregnancy and childbirth.  PRENATAL EXAMS  · During prenatal visits, your weight, blood pressure, and urine are checked. This is done to make sure you are healthy and progressing normally during the pregnancy.  · A pregnant woman should gain 25 to 35 pounds during the pregnancy. However, if you are overweight or underweight, your caregiver will advise you regarding your weight.  · Your caregiver will ask and answer questions for you.  · Blood work, cervical cultures, other necessary tests, and a Pap test are done during your prenatal exams. These tests are done to check on your health and the probable health of your baby. Tests are strongly recommended and done for HIV with your permission. This is the virus that causes AIDS. These tests are done because medicines can be given to help prevent your baby from being born with this infection should you have been infected without knowing it. Blood work is also used to find out your blood type, previous infections, and follow your blood levels (hemoglobin).  · Low hemoglobin (anemia) is common during pregnancy. Iron and vitamins are given to help prevent this. Later in the pregnancy, blood  tests for diabetes will be done along with any other tests if any problems develop.  · You may need other tests to make sure you and the baby are doing well.  CHANGES DURING THE FIRST TRIMESTER   Your body goes through many changes during pregnancy. They vary from person to person. Talk to your caregiver about changes you notice and are concerned about. Changes can include:  · Your menstrual period stops.  · The egg and sperm carry the genes that determine what you look like. Genes from you and your partner are forming a baby. The female genes determine whether the baby is a boy or a girl.  · Your body increases in girth and you may feel bloated.  · Feeling sick to your stomach (nauseous) and throwing up (vomiting). If the vomiting is uncontrollable, call your caregiver.  · Your breasts will begin to enlarge and become tender.  · Your nipples may stick out more and become darker.  · The need to urinate more. Painful urination may mean you have a bladder infection.  · Tiring easily.  · Loss of appetite.  · Cravings for certain kinds of food.  · At first, you may gain or lose a couple of pounds.  · You may have changes in your emotions from day to day (excited to be pregnant or concerned something may go wrong with the pregnancy and baby).  · You may have more vivid and strange dreams.  HOME CARE INSTRUCTIONS   ·   It is very important to avoid all smoking, alcohol and non-prescribed drugs during your pregnancy. These affect the formation and growth of the baby. Avoid chemicals while pregnant to ensure the delivery of a healthy infant.  · Start your prenatal visits by the 12th week of pregnancy. They are usually scheduled monthly at first, then more often in the last 2 months before delivery. Keep your caregiver's appointments. Follow your caregiver's instructions regarding medicine use, blood and lab tests, exercise, and diet.  · During pregnancy, you are providing food for you and your baby. Eat regular, well-balanced  meals. Choose foods such as meat, fish, milk and other low fat dairy products, vegetables, fruits, and whole-grain breads and cereals. Your caregiver will tell you of the ideal weight gain.  · You can help morning sickness by keeping soda crackers at the bedside. Eat a couple before arising in the morning. You may want to use the crackers without salt on them.  · Eating 4 to 5 small meals rather than 3 large meals a day also may help the nausea and vomiting.  · Drinking liquids between meals instead of during meals also seems to help nausea and vomiting.  · A physical sexual relationship may be continued throughout pregnancy if there are no other problems. Problems may be early (premature) leaking of amniotic fluid from the membranes, vaginal bleeding, or belly (abdominal) pain.  · Exercise regularly if there are no restrictions. Check with your caregiver or physical therapist if you are unsure of the safety of some of your exercises. Greater weight gain will occur in the last 2 trimesters of pregnancy. Exercising will help:  · Control your weight.  · Keep you in shape.  · Prepare you for labor and delivery.  · Help you lose your pregnancy weight after you deliver your baby.  · Wear a good support or jogging bra for breast tenderness during pregnancy. This may help if worn during sleep too.  · Ask when prenatal classes are available. Begin classes when they are offered.  · Do not use hot tubs, steam rooms, or saunas.  · Wear your seat belt when driving. This protects you and your baby if you are in an accident.  · Avoid raw meat, uncooked cheese, cat litter boxes, and soil used by cats throughout the pregnancy. These carry germs that can cause birth defects in the baby.  · The first trimester is a good time to visit your dentist for your dental health. Getting your teeth cleaned is okay. Use a softer toothbrush and brush gently during pregnancy.  · Ask for help if you have financial, counseling, or nutritional needs  during pregnancy. Your caregiver will be able to offer counseling for these needs as well as refer you for other special needs.  · Do not take any medicines or herbs unless told by your caregiver.  · Inform your caregiver if there is any mental or physical domestic violence.  · Make a list of emergency phone numbers of family, friends, hospital, and police and fire departments.  · Write down your questions. Take them to your prenatal visit.  · Do not douche.  · Do not cross your legs.  · If you have to stand for long periods of time, rotate you feet or take small steps in a circle.  · You may have more vaginal secretions that may require a sanitary pad. Do not use tampons or scented sanitary pads.  MEDICINES AND DRUG USE IN PREGNANCY  ·   Take prenatal vitamins as directed. The vitamin should contain 1 milligram of folic acid. Keep all vitamins out of reach of children. Only a couple vitamins or tablets containing iron may be fatal to a baby or young child when ingested.  · Avoid use of all medicines, including herbs, over-the-counter medicines, not prescribed or suggested by your caregiver. Only take over-the-counter or prescription medicines for pain, discomfort, or fever as directed by your caregiver. Do not use aspirin, ibuprofen, or naproxen unless directed by your caregiver.  · Let your caregiver also know about herbs you may be using.  · Alcohol is related to a number of birth defects. This includes fetal alcohol syndrome. All alcohol, in any form, should be avoided completely. Smoking will cause low birth rate and premature babies.  · Street or illegal drugs are very harmful to the baby. They are absolutely forbidden. A baby born to an addicted mother will be addicted at birth. The baby will go through the same withdrawal an adult does.  · Let your caregiver know about any medicines that you have to take and for what reason you take them.  SEEK MEDICAL CARE IF:   You have any concerns or worries during your  pregnancy. It is better to call with your questions if you feel they cannot wait, rather than worry about them.  SEEK IMMEDIATE MEDICAL CARE IF:   · An unexplained oral temperature above 102° F (38.9° C) develops, or as your caregiver suggests.  · You have leaking of fluid from the vagina (birth canal). If leaking membranes are suspected, take your temperature and inform your caregiver of this when you call.  · There is vaginal spotting or bleeding. Notify your caregiver of the amount and how many pads are used.  · You develop a bad smelling vaginal discharge with a change in the color.  · You continue to feel sick to your stomach (nauseated) and have no relief from remedies suggested. You vomit blood or coffee ground-like materials.  · You lose more than 2 pounds of weight in 1 week.  · You gain more than 2 pounds of weight in 1 week and you notice swelling of your face, hands, feet, or legs.  · You gain 5 pounds or more in 1 week (even if you do not have swelling of your hands, face, legs, or feet).  · You get exposed to German measles and have never had them.  · You are exposed to fifth disease or chickenpox.  · You develop belly (abdominal) pain. Round ligament discomfort is a common non-cancerous (benign) cause of abdominal pain in pregnancy. Your caregiver still must evaluate this.  · You develop headache, fever, diarrhea, pain with urination, or shortness of breath.  · You fall or are in a car accident or have any kind of trauma.  · There is mental or physical violence in your home.  Document Released: 10/04/2001 Document Revised: 07/04/2012 Document Reviewed: 04/07/2009  [employer]® Patient Information ©2014 [employer].

## 2013-09-18 NOTE — Progress Notes (Signed)
New OB.  10.2wks by LMP.  Got a call from her brother who is "her ride" with him yelling at her to come to the car.  She states she cannot stay. Does tell me that the man she slept with recently told her he had chlamydia, so she wants treatment. Encouraged her to allow me to test, so we can test for [facility] and trich also.  Quick swabs obtained for GC/Chl and wet prep. Will reschedule NEW OB later.

## 2013-09-19 LAB — WET PREP, GENITAL
Clue Cells Wet Prep HPF POC: NONE SEEN
Trich, Wet Prep: NONE SEEN
WBC, Wet Prep HPF POC: NONE SEEN
Yeast Wet Prep HPF POC: NONE SEEN

## 2013-09-24 ENCOUNTER — Telehealth: Payer: Self-pay | Admitting: *Deleted

## 2013-09-24 DIAGNOSIS — A749 Chlamydial infection, unspecified: Secondary | ICD-10-CM

## 2013-09-24 MED ORDER — AZITHROMYCIN 500 MG PO TABS: 1000.0000 mg | ORAL_TABLET | Freq: Every day | ORAL | Status: DC

## 2013-09-24 NOTE — Telephone Encounter (Signed)
Spoke with pt/pt requesting medication that is cheaper.

## 2013-09-24 NOTE — Telephone Encounter (Signed)
Spoke with pt concerning [employer] changing prescription to a cheaper form.  Test results given per pt request/informed change of form for medication to pills, should be about $18.00. Pt verbalizes understanding.

## 2013-09-24 NOTE — Telephone Encounter (Signed)
Called nurse line requesting call back. 

## 2013-09-24 NOTE — Telephone Encounter (Signed)
Pt. Called stating medication was going to cost her 70 dollars at her pharmacy. Called [facility] who stated the powder cost 70 and the tablets cost 18.93. Re-ordered the prescritption to 2 tabs so that it is much cheaper for patient. Will call pt. And inform of change.

## 2013-10-02 ENCOUNTER — Encounter: Payer: Self-pay | Admitting: Family

## 2013-10-14 ENCOUNTER — Encounter: Payer: Self-pay | Admitting: Obstetrics and Gynecology

## 2013-11-12 ENCOUNTER — Encounter (HOSPITAL_COMMUNITY): Payer: Self-pay | Admitting: Emergency Medicine

## 2013-11-12 ENCOUNTER — Emergency Department (HOSPITAL_COMMUNITY)
Admission: EM | Admit: 2013-11-12 | Discharge: 2013-11-13 | Disposition: A | Discharge status: Home / Self Care | Payer: Self-pay | Attending: Emergency Medicine | Admitting: Emergency Medicine

## 2013-11-12 DIAGNOSIS — Z79899 Other long term (current) drug therapy: Secondary | ICD-10-CM | POA: Insufficient documentation

## 2013-11-12 DIAGNOSIS — Z91040 Latex allergy status: Secondary | ICD-10-CM | POA: Insufficient documentation

## 2013-11-12 DIAGNOSIS — Z8679 Personal history of other diseases of the circulatory system: Secondary | ICD-10-CM | POA: Insufficient documentation

## 2013-11-12 DIAGNOSIS — J069 Acute upper respiratory infection, unspecified: Secondary | ICD-10-CM | POA: Insufficient documentation

## 2013-11-12 DIAGNOSIS — Z8619 Personal history of other infectious and parasitic diseases: Secondary | ICD-10-CM | POA: Insufficient documentation

## 2013-11-12 DIAGNOSIS — R0789 Other chest pain: Secondary | ICD-10-CM

## 2013-11-12 DIAGNOSIS — R071 Chest pain on breathing: Secondary | ICD-10-CM | POA: Insufficient documentation

## 2013-11-12 DIAGNOSIS — O9989 Other specified diseases and conditions complicating pregnancy, childbirth and the puerperium: Secondary | ICD-10-CM | POA: Insufficient documentation

## 2013-11-12 LAB — CBC
HEMATOCRIT: 36.9 % (ref 36.0–46.0)
HEMOGLOBIN: 11.8 g/dL — AB (ref 12.0–15.0)
MCH: 21.9 pg — AB (ref 26.0–34.0)
MCHC: 32 g/dL (ref 30.0–36.0)
MCV: 68.5 fL — ABNORMAL LOW (ref 78.0–100.0)
Platelets: 318 10*3/uL (ref 150–400)
RBC: 5.39 MIL/uL — AB (ref 3.87–5.11)
RDW: 15.6 % — ABNORMAL HIGH (ref 11.5–15.5)
WBC: 10.8 10*3/uL — ABNORMAL HIGH (ref 4.0–10.5)

## 2013-11-12 LAB — PRO B NATRIURETIC PEPTIDE: Pro B Natriuretic peptide (BNP): <5 pg/mL (ref 0–125)

## 2013-11-12 LAB — BASIC METABOLIC PANEL
BUN: 11 mg/dL (ref 6–23)
CALCIUM: 9.2 mg/dL (ref 8.4–10.5)
CO2: 23 meq/L (ref 19–32)
Chloride: 100 mEq/L (ref 96–112)
Creatinine, Ser: 0.74 mg/dL (ref 0.50–1.10)
GFR calc Af Amer: >90 mL/min (ref >90)
GLUCOSE: 97 mg/dL (ref 70–99)
Potassium: 3.9 mEq/L (ref 3.7–5.3)
SODIUM: 139 meq/L (ref 137–147)

## 2013-11-12 LAB — POCT I-STAT TROPONIN I: Troponin i, poc: 0 ng/mL (ref 0.00–0.08)

## 2013-11-12 NOTE — ED Notes (Signed)
Pt reports recent hx of bronchitis, admits to intermittent chest pain/pressure x3 days - pt also reports non-productive cough. Pain worse w/ inspiration. Pt A&Ox4, in no acute distress, skin warm and dry.

## 2013-11-12 NOTE — ED Notes (Signed)
Pt. reports intermittent mid chest pain with SOB , dry cough and nausea for 3 days , pt. stated she is 3 months pregnant ( G1P0) .

## 2013-11-13 MED ORDER — IBUPROFEN 800 MG PO TABS: 800.0000 mg | ORAL_TABLET | Freq: Once | ORAL | Status: DC

## 2013-11-13 MED ORDER — ACETAMINOPHEN 325 MG PO TABS
650.0000 mg | ORAL_TABLET | Freq: Once | ORAL | Status: AC
  Administered 2013-11-13: 325 mg via ORAL
  Filled 2013-11-13: qty 2

## 2013-11-13 MED ORDER — ALBUTEROL SULFATE HFA 108 (90 BASE) MCG/ACT IN AERS
2.0000 | INHALATION_SPRAY | Freq: Once | RESPIRATORY_TRACT | Status: AC
  Administered 2013-11-13: 2 via RESPIRATORY_TRACT
  Filled 2013-11-13: qty 6.7

## 2013-11-13 NOTE — Discharge Instructions (Signed)
If you were given medicines take as directed.  If you are on coumadin or contraceptives realize their levels and effectiveness is altered by many different medicines.  If you have any reaction (rash, tongues swelling, other) to the medicines stop taking and see a physician.   °Please follow up as directed and return to the ER or see a physician for new or worsening symptoms.  Thank you. ° ° °

## 2013-11-13 NOTE — ED Provider Notes (Addendum)
CSN: [id number]     Arrival date & time 11/12/13  2122 History   First MD Initiated Contact with Patient 11/12/13 2343     Chief Complaint  Patient presents with  . Chest Pain   (Consider location/radiation/quality/duration/timing/severity/associated sxs/prior Treatment) HPI Comments: 23 yo female with herpes hx presents with cough, chest pain for 3 days and congestion.  Pt three months pregnant. No sick contacts or travel.  Dry cough.  Chest pain with coughing upper anterior and with palpation.  No cardiac or PE risks. Patient denies blood clot history, active cancer, recent major trauma or surgery, unilateral leg swelling/ pain, recent long travel, hemoptysis or oral contraceptives.  Pt denies pleuritic pain, states with coughing, clarified nurses note.    Patient is a 22 y.o. female presenting with chest pain. The history is provided by the patient.  Chest Pain Associated symptoms: cough   Associated symptoms: no abdominal pain, no back pain, no fever, no headache, no shortness of breath and not vomiting     Past Medical History  Diagnosis Date  . Chlamydia   . Trichomonas   . Bronchitis   . Migraines 2012   Past Surgical History  Procedure Laterality Date  . No past surgeries     Family History  Problem Relation Age of Onset  . Asthma Mother   . Arthritis Mother   . Diabetes Mother   . Heart disease Mother   . Hypertension Mother   . Stroke Mother   . Kidney disease Mother   . Vision loss Mother   . Diabetes Maternal Grandmother   . Hypertension Maternal Grandmother    History  Substance Use Topics  . Smoking status: Never Smoker   . Smokeless tobacco: Never Used  . Alcohol Use: No     Comment: socially   OB History   Grav Para Term Preterm Abortions TAB SAB Ect Mult Living   2              Review of Systems  Constitutional: Negative for fever and chills.  HENT: Positive for congestion.   Eyes: Negative for visual disturbance.  Respiratory: Positive for  cough. Negative for shortness of breath.   Cardiovascular: Positive for chest pain.  Gastrointestinal: Negative for vomiting and abdominal pain.  Genitourinary: Negative for dysuria and flank pain.  Musculoskeletal: Positive for arthralgias. Negative for back pain, neck pain and neck stiffness.  Skin: Negative for rash.  Neurological: Negative for light-headedness and headaches.    Allergies  Latex  Home Medications   Current Outpatient Rx  Name  Route  Sig  Dispense  Refill  . acetaminophen (TYLENOL) 500 MG tablet   Oral   Take 1,000 mg by mouth 2 (two) times daily as needed for pain.          . diphenhydrAMINE (BENADRYL) 25 mg capsule   Oral   Take 12.5 mg by mouth 2 (two) times daily as needed for itching or allergies.          . Prenatal Vit-Fe Fumarate-FA (PRENATAL COMPLETE) 14-0.4 MG TABS   Oral   Take 1 tablet by mouth daily.   60 each   2    BP 123/84  Pulse 97  Temp(Src) 99 F (37.2 C) (Oral)  Resp 20  SpO2 100%  LMP 07/08/2013 Physical Exam  Nursing note and vitals reviewed. Constitutional: She is oriented to person, place, and time. She appears well-developed and well-nourished.  HENT:  Head: Normocephalic and atraumatic.  Eyes: Conjunctivae  are normal. Right eye exhibits no discharge. Left eye exhibits no discharge.  Neck: Normal range of motion. Neck supple. No tracheal deviation present.  Cardiovascular: Normal rate and regular rhythm.   Pulmonary/Chest: Effort normal and breath sounds normal.  Abdominal: Soft. She exhibits no distension. There is no tenderness. There is no guarding.  Musculoskeletal: She exhibits tenderness (parasternal upper). She exhibits no edema.  Neurological: She is alert and oriented to person, place, and time.  Skin: Skin is warm. No rash noted.  Psychiatric: She has a normal mood and affect.    ED Course  Procedures (including critical care time) Labs Review Labs Reviewed  CBC - Abnormal; Notable for the following:     WBC 10.8 (*)    RBC 5.39 (*)    Hemoglobin 11.8 (*)    MCV 68.5 (*)    MCH 21.9 (*)    RDW 15.6 (*)    All other components within normal limits  BASIC METABOLIC PANEL  PRO B NATRIURETIC PEPTIDE  POCT I-STAT TROPONIN I   Imaging Review No results found.  EKG Interpretation    Date/Time:  Tuesday November 12 2013 21:30:41 EST Ventricular Rate:  95 PR Interval:  118 QRS Duration: 80 QT Interval:  350 QTC Calculation: 439 R Axis:   75 Text Interpretation:  Normal sinus rhythm Normal ECG Confirmed by Natiya Seelinger  MD, Devyn Griffing ([id number]) on 11/12/2013 11:58:38 PM            MDM   1. Chest wall pain   2. URI (upper respiratory infection)    Pt very low risk PE and CAD.  [facility] low risk PE. Less than 23 yo, hr <100, O2 sat >94%, no hx of DVT/PE, no recent trauma/ surgery, no hemoptysis, no exogenous estrogen or unilateral leg swelling.  No further work up indicated at this time for PE as pretest probability very low.    Pt is early pregnancy so PERC may not apply however clinically URI and risk of radiation and low pretest prob close fup discussed. Filed Vitals:   11/12/13 2137 11/12/13 2228  BP:  123/84  Pulse: 97   Temp: 99 F (37.2 C)   TempSrc: Oral   Resp: 20   SpO2: 100%    Cardiac screen normal.  Clinically MSK and URI. Well appearing.  Inhaler and tylenol in ED. PERC neg.   Results and differential diagnosis were discussed with the patient. Close follow up outpatient was discussed, patient comfortable with the plan.         Enid SkeensJoshua M Tyden Kann, MD 11/13/13 [id number]  Enid SkeensJoshua M Bronte Kropf, MD 11/13/13 [phone]

## 2013-11-27 ENCOUNTER — Telehealth: Payer: Self-pay

## 2013-11-27 NOTE — Telephone Encounter (Signed)
Message copied by Faythe CasaBELLAMY, Keyontay Stolz M on Wed Nov 27, 2013 11:56 AM ------      Message from: Aviva SignsWILLIAMS, MARIE L      Created: Wed Nov 27, 2013 11:33 AM      Regarding: needs to come back to clinic       Left during her [facility] in November.  Did not do prenatal labs that day because she had to leave.            Has not been back since late November.            Can we call her to come back?            Marie ------

## 2013-11-27 NOTE — Telephone Encounter (Signed)
Called pt and asked if patient if she has been receiving care.  Pt informed me that she has not been care she has been going to the ER because she has not had any money and transportation issues.  I advised pt not to use the ER for mangement of care. That we can set her up an appt to continue to her prenatal care here and that we can give her contact #'s to be able to get her Medicaid started and it will retro pay her visits so the concern of not having money she don't have to worry about.  I also advised her that once she gets Medicaid that we can help with setting her up with transportation.  I gave her an appt for Friday February 13th @ 0830 with Dr.  Debroah LoopArnold.  Pt stated that she will be here.

## 2013-12-06 ENCOUNTER — Encounter: Payer: Self-pay | Admitting: Obstetrics & Gynecology

## 2013-12-06 ENCOUNTER — Other Ambulatory Visit: Payer: Self-pay | Admitting: Obstetrics & Gynecology

## 2013-12-06 ENCOUNTER — Ambulatory Visit (HOSPITAL_COMMUNITY)
Admission: RE | Admit: 2013-12-06 | Discharge: 2013-12-06 | Disposition: A | Discharge status: Home / Self Care | Payer: Self-pay | Source: Ambulatory Visit | Attending: Obstetrics & Gynecology | Admitting: Obstetrics & Gynecology

## 2013-12-06 ENCOUNTER — Ambulatory Visit (INDEPENDENT_AMBULATORY_CARE_PROVIDER_SITE_OTHER): Payer: Self-pay | Admitting: Obstetrics & Gynecology

## 2013-12-06 VITALS — BP 117/74 | Temp 97.5°F | Wt 160.3 lb

## 2013-12-06 DIAGNOSIS — N8300 Follicular cyst of ovary, unspecified side: Secondary | ICD-10-CM | POA: Insufficient documentation

## 2013-12-06 DIAGNOSIS — O34599 Maternal care for other abnormalities of gravid uterus, unspecified trimester: Secondary | ICD-10-CM | POA: Insufficient documentation

## 2013-12-06 DIAGNOSIS — O368390 Maternal care for abnormalities of the fetal heart rate or rhythm, unspecified trimester, not applicable or unspecified: Secondary | ICD-10-CM | POA: Insufficient documentation

## 2013-12-06 DIAGNOSIS — Z3400 Encounter for supervision of normal first pregnancy, unspecified trimester: Secondary | ICD-10-CM

## 2013-12-06 DIAGNOSIS — Z8619 Personal history of other infectious and parasitic diseases: Secondary | ICD-10-CM

## 2013-12-06 DIAGNOSIS — O0990 Supervision of high risk pregnancy, unspecified, unspecified trimester: Secondary | ICD-10-CM | POA: Insufficient documentation

## 2013-12-06 LAB — POCT URINALYSIS DIP (DEVICE)
BILIRUBIN URINE: NEGATIVE
GLUCOSE, UA: NEGATIVE mg/dL
Hgb urine dipstick: NEGATIVE
Ketones, ur: NEGATIVE mg/dL
Leukocytes, UA: NEGATIVE
Nitrite: NEGATIVE
Protein, ur: NEGATIVE mg/dL
Urobilinogen, UA: 0.2 mg/dL (ref 0.0–1.0)
pH: 6 (ref 5.0–8.0)

## 2013-12-06 LAB — POCT PREGNANCY, URINE: Preg Test, Ur: NEGATIVE

## 2013-12-06 NOTE — Patient Instructions (Signed)
Miscarriage A miscarriage is the sudden loss of an unborn baby (fetus) before the 20th week of pregnancy. Most miscarriages happen in the first 3 months of pregnancy. Sometimes, it happens before a woman even knows she is pregnant. A miscarriage is also called a "spontaneous miscarriage" or "early pregnancy loss." Having a miscarriage can be an emotional experience. Talk with your caregiver about any questions you may have about miscarrying, the grieving process, and your future pregnancy plans. CAUSES   Problems with the fetal chromosomes that make it impossible for the baby to develop normally. Problems with the baby's genes or chromosomes are most often the result of errors that occur, by chance, as the embryo divides and grows. The problems are not inherited from the parents.  Infection of the cervix or uterus.   Hormone problems.   Problems with the cervix, such as having an incompetent cervix. This is when the tissue in the cervix is not strong enough to hold the pregnancy.   Problems with the uterus, such as an abnormally shaped uterus, uterine fibroids, or congenital abnormalities.   Certain medical conditions.   Smoking, drinking alcohol, or taking illegal drugs.   Trauma.  Often, the cause of a miscarriage is unknown.  SYMPTOMS   Vaginal bleeding or spotting, with or without cramps or pain.  Pain or cramping in the abdomen or lower back.  Passing fluid, tissue, or blood clots from the vagina. DIAGNOSIS  Your caregiver will perform a physical exam. You may also have an ultrasound to confirm the miscarriage. Blood or urine tests may also be ordered. TREATMENT   Sometimes, treatment is not necessary if you naturally pass all the fetal tissue that was in the uterus. If some of the fetus or placenta remains in the body (incomplete miscarriage), tissue left behind may become infected and must be removed. Usually, a dilation and curettage (D and C) procedure is performed.  During a D and C procedure, the cervix is widened (dilated) and any remaining fetal or placental tissue is gently removed from the uterus.  Antibiotic medicines are prescribed if there is an infection. Other medicines may be given to reduce the size of the uterus (contract) if there is a lot of bleeding.  If you have Rh negative blood and your baby was Rh positive, you will need a Rh immunoglobulin shot. This shot will protect any future baby from having Rh blood problems in future pregnancies. HOME CARE INSTRUCTIONS   Your caregiver may order bed rest or may allow you to continue light activity. Resume activity as directed by your caregiver.  Have someone help with home and family responsibilities during this time.   Keep track of the number of sanitary pads you use each day and how soaked (saturated) they are. Write down this information.   Do not use tampons. Do not douche or have sexual intercourse until approved by your caregiver.   Only take over-the-counter or prescription medicines for pain or discomfort as directed by your caregiver.   Do not take aspirin. Aspirin can cause bleeding.   Keep all follow-up appointments with your caregiver.   If you or your partner have problems with grieving, talk to your caregiver or seek counseling to help cope with the pregnancy loss. Allow enough time to grieve before trying to get pregnant again.  SEEK IMMEDIATE MEDICAL CARE IF:   You have severe cramps or pain in your back or abdomen.  You have a fever.  You pass large blood clots (walnut-sized   or larger) ortissue from your vagina. Save any tissue for your caregiver to inspect.   Your bleeding increases.   You have a thick, bad-smelling vaginal discharge.  You become lightheaded, weak, or you faint.   You have chills.  MAKE SURE YOU:  Understand these instructions.  Will watch your condition.  Will get help right away if you are not doing well or get  worse. Document Released: 04/05/2001 Document Revised: 02/04/2013 Document Reviewed: 11/29/2011 Exit[employer] Patient Information 2014 [employer].  

## 2013-12-06 NOTE — Progress Notes (Signed)
Missed appointments, NOB not done when seen in November. Genl PE benign. To US now-m no IUP, neg UPT. Patient had bleeding in Dec, findings c/w SAb. RTC 4 weeks

## 2013-12-06 NOTE — Progress Notes (Signed)
Pulse- 81 Edema-feet

## 2013-12-11 ENCOUNTER — Telehealth: Payer: Self-pay

## 2013-12-11 MED ORDER — METRONIDAZOLE 500 MG PO TABS
2000.0000 mg | ORAL_TABLET | Freq: Once | ORAL | Status: DC
Start: 2013-12-11 — End: 2014-02-24

## 2013-12-11 NOTE — Telephone Encounter (Signed)
Message copied by Faythe CasaBELLAMY, Abigail Marsiglia M on Wed Dec 11, 2013 10:23 AM ------      Message from: Adam PhenixARNOLD, JAMES G      Created: Wed Dec 11, 2013  9:53 AM       Trichomonas, Rx Flagyl 2 g po single dose ------

## 2013-12-11 NOTE — Telephone Encounter (Signed)
Called pt and left message for patient to return call to the clinics concerning results and that [facility] [address].

## 2013-12-12 NOTE — Telephone Encounter (Signed)
Patient left message that she is returning our call. I called patient and informed her of results.

## 2014-01-06 ENCOUNTER — Ambulatory Visit: Payer: Self-pay | Admitting: Obstetrics & Gynecology

## 2014-01-22 ENCOUNTER — Ambulatory Visit: Payer: Self-pay | Admitting: Obstetrics & Gynecology

## 2014-02-24 ENCOUNTER — Inpatient Hospital Stay (HOSPITAL_COMMUNITY)
Admission: AD | Admit: 2014-02-24 | Discharge: 2014-02-24 | Disposition: A | Discharge status: Home / Self Care | Payer: Self-pay | Source: Ambulatory Visit | Attending: Obstetrics & Gynecology | Admitting: Obstetrics & Gynecology

## 2014-02-24 ENCOUNTER — Encounter (HOSPITAL_COMMUNITY): Payer: Self-pay

## 2014-02-24 DIAGNOSIS — J988 Other specified respiratory disorders: Secondary | ICD-10-CM | POA: Insufficient documentation

## 2014-02-24 DIAGNOSIS — N911 Secondary amenorrhea: Secondary | ICD-10-CM

## 2014-02-24 DIAGNOSIS — N912 Amenorrhea, unspecified: Secondary | ICD-10-CM | POA: Insufficient documentation

## 2014-02-24 DIAGNOSIS — R55 Syncope and collapse: Secondary | ICD-10-CM | POA: Insufficient documentation

## 2014-02-24 DIAGNOSIS — J709 Respiratory conditions due to unspecified external agent: Secondary | ICD-10-CM

## 2014-02-24 LAB — BASIC METABOLIC PANEL
BUN: 10 mg/dL (ref 6–23)
CALCIUM: 9.6 mg/dL (ref 8.4–10.5)
CO2: 25 meq/L (ref 19–32)
CREATININE: 0.71 mg/dL (ref 0.50–1.10)
Chloride: 99 mEq/L (ref 96–112)
GFR calc Af Amer: >90 mL/min (ref >90)
GFR calc non Af Amer: >90 mL/min (ref >90)
Glucose, Bld: 89 mg/dL (ref 70–99)
Potassium: 4 mEq/L (ref 3.7–5.3)
Sodium: 137 mEq/L (ref 137–147)

## 2014-02-24 LAB — CBC
HEMATOCRIT: 36.4 % (ref 36.0–46.0)
Hemoglobin: 11.4 g/dL — ABNORMAL LOW (ref 12.0–15.0)
MCH: 21.6 pg — AB (ref 26.0–34.0)
MCHC: 31.3 g/dL (ref 30.0–36.0)
MCV: 68.8 fL — AB (ref 78.0–100.0)
PLATELETS: 257 10*3/uL (ref 150–400)
RBC: 5.29 MIL/uL — AB (ref 3.87–5.11)
RDW: 15.3 % (ref 11.5–15.5)
WBC: 9.8 10*3/uL (ref 4.0–10.5)

## 2014-02-24 LAB — URINALYSIS, ROUTINE W REFLEX MICROSCOPIC
Bilirubin Urine: NEGATIVE
GLUCOSE, UA: NEGATIVE mg/dL
Hgb urine dipstick: NEGATIVE
Ketones, ur: 15 mg/dL — AB
LEUKOCYTES UA: NEGATIVE
Nitrite: NEGATIVE
Protein, ur: NEGATIVE mg/dL
SPECIFIC GRAVITY, URINE: 1.025 (ref 1.005–1.030)
Urobilinogen, UA: 0.2 mg/dL (ref 0.0–1.0)
pH: 6 (ref 5.0–8.0)

## 2014-02-24 LAB — POCT PREGNANCY, URINE: PREG TEST UR: NEGATIVE

## 2014-02-24 MED ORDER — ONDANSETRON 4 MG PO TBDP
4.0000 mg | ORAL_TABLET | Freq: Once | ORAL | Status: AC
  Administered 2014-02-24: 4 mg via ORAL
  Filled 2014-02-24: qty 1

## 2014-02-24 MED ORDER — PROMETHAZINE HCL 12.5 MG PO TABS: 12.5000 mg | ORAL_TABLET | Freq: Four times a day (QID) | ORAL | Status: DC | PRN

## 2014-02-24 MED ORDER — ONDANSETRON 4 MG PO TBDP: 4.0000 mg | ORAL_TABLET | Freq: Four times a day (QID) | ORAL | Status: DC | PRN
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
## 2014-02-24 NOTE — Discharge Instructions (Signed)
Syncope  Syncope is a fainting spell. This means the person loses consciousness and drops to the ground. The person is generally unconscious for less than 5 minutes. The person may have some muscle twitches for up to 15 seconds before waking up and returning to normal. Syncope occurs more often in elderly people, but it can happen to anyone. While most causes of syncope are not dangerous, syncope can be a sign of a serious medical problem. It is important to seek medical care.   CAUSES   Syncope is caused by a sudden decrease in blood flow to the brain. The specific cause is often not determined. Factors that can trigger syncope include:   Taking medicines that lower blood pressure.   Sudden changes in posture, such as standing up suddenly.   Taking more medicine than prescribed.   Standing in one place for too long.   Seizure disorders.   Dehydration and excessive exposure to heat.   Low blood sugar (hypoglycemia).   Straining to have a bowel movement.   Heart disease, irregular heartbeat, or other circulatory problems.   Fear, emotional distress, seeing blood, or severe pain.  SYMPTOMS   Right before fainting, you may:   Feel dizzy or lightheaded.   Feel nauseous.   See all white or all black in your field of vision.   Have cold, clammy skin.  DIAGNOSIS   Your caregiver will ask about your symptoms, perform a physical exam, and perform electrocardiography (ECG) to record the electrical activity of your heart. Your caregiver may also perform other heart or blood tests to determine the cause of your syncope.  TREATMENT   In most cases, no treatment is needed. Depending on the cause of your syncope, your caregiver may recommend changing or stopping some of your medicines.  HOME CARE INSTRUCTIONS   Have someone stay with you until you feel stable.   Do not drive, operate machinery, or play sports until your caregiver says it is okay.   Keep all follow-up appointments as directed by your  caregiver.   Lie down right away if you start feeling like you might faint. Breathe deeply and steadily. Wait until all the symptoms have passed.   Drink enough fluids to keep your urine clear or pale yellow.   If you are taking blood pressure or heart medicine, get up slowly, taking several minutes to sit and then stand. This can reduce dizziness.  SEEK IMMEDIATE MEDICAL CARE IF:    You have a severe headache.   You have unusual pain in the chest, abdomen, or back.   You are bleeding from the mouth or rectum, or you have black or tarry stool.   You have an irregular or very fast heartbeat.   You have pain with breathing.   You have repeated fainting or seizure-like jerking during an episode.   You faint when sitting or lying down.   You have confusion.   You have difficulty walking.   You have severe weakness.   You have vision problems.  If you fainted, call your local emergency services (911 in [place] Do not drive yourself to the hospital.   MAKE SURE YOU:   Understand these instructions.   Will watch your condition.   Will get help right away if you are not doing well or get worse.  Document Released: 10/10/2005 Document Revised: 04/10/2012 Document Reviewed: 12/09/2011  [employer] Patient Information 2014 [employer].
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
## 2014-02-25 ENCOUNTER — Encounter: Payer: Self-pay | Admitting: Medical

## 2014-02-25 NOTE — MAU Provider Note (Signed)
Attestation of Attending Supervision of Advanced Practitioner (PA/CNM/NP): Evaluation and management procedures were performed by the Advanced Practitioner under my supervision and collaboration.  I have reviewed the Advanced Practitioner's note and chart, and I agree with the management and plan.  Samona Chihuahua, MD, FACOG Attending Obstetrician & Gynecologist Faculty Practice, [facility]  

## 2014-04-18 ENCOUNTER — Encounter: Payer: Self-pay | Admitting: Medical

## 2014-05-07 ENCOUNTER — Inpatient Hospital Stay (HOSPITAL_COMMUNITY)
Admission: AD | Admit: 2014-05-07 | Discharge: 2014-05-07 | Disposition: A | Discharge status: Home / Self Care | Payer: Self-pay | Source: Ambulatory Visit | Attending: Obstetrics & Gynecology | Admitting: Obstetrics & Gynecology

## 2014-05-07 ENCOUNTER — Encounter (HOSPITAL_COMMUNITY): Payer: Self-pay | Admitting: *Deleted

## 2014-05-07 DIAGNOSIS — R0602 Shortness of breath: Secondary | ICD-10-CM | POA: Insufficient documentation

## 2014-05-07 DIAGNOSIS — J029 Acute pharyngitis, unspecified: Secondary | ICD-10-CM | POA: Insufficient documentation

## 2014-05-07 DIAGNOSIS — R109 Unspecified abdominal pain: Secondary | ICD-10-CM | POA: Insufficient documentation

## 2014-05-07 DIAGNOSIS — M7989 Other specified soft tissue disorders: Secondary | ICD-10-CM | POA: Insufficient documentation

## 2014-05-07 LAB — URINALYSIS, ROUTINE W REFLEX MICROSCOPIC
BILIRUBIN URINE: NEGATIVE
GLUCOSE, UA: NEGATIVE mg/dL
Hgb urine dipstick: NEGATIVE
KETONES UR: NEGATIVE mg/dL
Nitrite: NEGATIVE
PH: 7.5 (ref 5.0–8.0)
PROTEIN: NEGATIVE mg/dL
Specific Gravity, Urine: 1.02 (ref 1.005–1.030)
Urobilinogen, UA: 0.2 mg/dL (ref 0.0–1.0)

## 2014-05-07 LAB — URINE MICROSCOPIC-ADD ON

## 2014-05-07 LAB — POCT PREGNANCY, URINE: Preg Test, Ur: NEGATIVE

## 2014-05-07 MED ORDER — ACETAMINOPHEN 500 MG PO TABS
1000.0000 mg | ORAL_TABLET | Freq: Once | ORAL | Status: AC
  Administered 2014-05-07: 1000 mg via ORAL
  Filled 2014-05-07: qty 2

## 2014-05-07 MED ORDER — PSEUDOEPHEDRINE HCL 30 MG PO TABS
30.0000 mg | ORAL_TABLET | Freq: Once | ORAL | Status: AC
  Administered 2014-05-07: 30 mg via ORAL
  Filled 2014-05-07: qty 1

## 2014-05-07 NOTE — MAU Note (Signed)
Patient presents with complaint of abdominal pain X 1 week; shortness of breath and sore throat X 11/2 weeks and swelling of hands and feet X 2-3 days.

## 2014-05-07 NOTE — MAU Provider Note (Signed)
History     CSN: [id number]  Arrival date and time: 05/07/14 1755   First Provider Initiated Contact with Patient 05/07/14 1849      Chief Complaint  Patient presents with  . Abdominal Pain  . Shortness of Breath  . Sore Throat  . Swelling of Hands and Feet    HPI Laura Hernandez is a 23 y.o. non pregnant female who presents with sore throat & abdominal pain.  Sore throat x 1.5 weeks. Difficulty swallowing & painful inhaling d/t throat pain. Denies congestion, headache, fever, chills. States has had a cough that comes & goes x 1 week, non productive. No sick contact. Has not taken any medication for these symptoms.   Lower abdominal pain intermittent x 2 weeks. Sharp & stabbing. Rates 7/10. Took tylenol & motrin with no relief. No aggravating factors. Denies vaginal bleeding or discharge. Pt reports that she has been evaluated for same pain several times before with normal results.  LMP 6/10; gets period every month. No birth control. Has been with same partner x 1 year, uses condoms.  Last BM 3 days ago; this is normal interval for her; no straining or blood in stool.     Past Medical History  Diagnosis Date  . Chlamydia   . Trichomonas   . Bronchitis   . Migraines 2012    Past Surgical History  Procedure Laterality Date  . No past surgeries      Family History  Problem Relation Age of Onset  . Asthma Mother   . Arthritis Mother   . Diabetes Mother   . Heart disease Mother   . Hypertension Mother   . Stroke Mother   . Kidney disease Mother   . Vision loss Mother   . Diabetes Maternal Grandmother   . Hypertension Maternal Grandmother     History  Substance Use Topics  . Smoking status: Never Smoker   . Smokeless tobacco: Never Used  . Alcohol Use: No     Comment: socially    Allergies:  Allergies  Allergen Reactions  . Latex Itching and Rash    Prescriptions prior to admission  Medication Sig Dispense Refill  . acetaminophen (TYLENOL) 500 MG tablet  Take 1,000 mg by mouth 2 (two) times daily as needed for pain.       Marland Kitchen albuterol (PROVENTIL HFA;VENTOLIN HFA) 108 (90 BASE) MCG/ACT inhaler Inhale 2 puffs into the lungs every 6 (six) hours as needed for wheezing or shortness of breath.      . ondansetron (ZOFRAN ODT) 4 MG disintegrating tablet Take 1 tablet (4 mg total) by mouth every 6 (six) hours as needed for nausea.  20 tablet  0  . promethazine (PHENERGAN) 12.5 MG tablet Take 1 tablet (12.5 mg total) by mouth every 6 (six) hours as needed for nausea or vomiting.  20 tablet  0    Review of Systems  Constitutional: Negative for fever and chills.       Decreased appetite  HENT: Positive for ear pain and sore throat. Negative for congestion, ear discharge, hearing loss, nosebleeds and tinnitus.   Eyes: Negative.   Respiratory: Positive for cough. Negative for sputum production, shortness of breath and wheezing.   Cardiovascular: Negative.  Negative for leg swelling.  Gastrointestinal: Positive for abdominal pain. Negative for heartburn, nausea, diarrhea, constipation and blood in stool.  Genitourinary: Negative.        Negative vaginal bleeding or discharge.    Physical Exam   Blood pressure 113/79,  pulse 84, temperature 99.1 F (37.3 C), temperature source Oral, resp. rate 16, height 4\' 11"  (1.499 m), weight 160 lb 8 oz (72.802 kg), last menstrual period 04/02/2014, SpO2 100.00%, unknown if currently breastfeeding.  Physical Exam  Constitutional: She is oriented to person, place, and time. She appears well-developed and well-nourished.  HENT:  Head: Normocephalic and atraumatic.  Right Ear: External ear normal.  Mouth/Throat: Oropharynx is clear and moist. No oropharyngeal exudate.  Neck: Normal range of motion. Neck supple. No thyromegaly present.  Cardiovascular: Normal rate, regular rhythm and normal heart sounds.   No murmur heard. Respiratory: Effort normal and breath sounds normal. No respiratory distress. She has no wheezes.   GI: Soft. Bowel sounds are normal. She exhibits no distension and no mass. There is tenderness. There is no rebound, no guarding, no CVA tenderness, no tenderness at McBurney's point and negative Murphy's sign.  Tenderness to lower abdomen with deep palpation  Lymphadenopathy:    She has no cervical adenopathy.  Neurological: She is alert and oriented to person, place, and time.  Skin: Skin is warm and dry.    MAU Course  Procedures Results for orders placed during the hospital encounter of 05/07/14 (from the past 24 hour(s))  URINALYSIS, ROUTINE W REFLEX MICROSCOPIC     Status: Abnormal   Collection Time    05/07/14  6:20 PM      Result Value Ref Range   Color, Urine YELLOW  YELLOW   APPearance CLEAR  CLEAR   Specific Gravity, Urine 1.020  1.005 - 1.030   pH 7.5  5.0 - 8.0   Glucose, UA NEGATIVE  NEGATIVE mg/dL   Hgb urine dipstick NEGATIVE  NEGATIVE   Bilirubin Urine NEGATIVE  NEGATIVE   Ketones, ur NEGATIVE  NEGATIVE mg/dL   Protein, ur NEGATIVE  NEGATIVE mg/dL   Urobilinogen, UA 0.2  0.0 - 1.0 mg/dL   Nitrite NEGATIVE  NEGATIVE   Leukocytes, UA TRACE (*) NEGATIVE  URINE MICROSCOPIC-ADD ON     Status: Abnormal   Collection Time    05/07/14  6:20 PM      Result Value Ref Range   Squamous Epithelial / LPF FEW (*) RARE   WBC, UA 0-2  <3 WBC/hpf   RBC / HPF 0-2  <3 RBC/hpf   Bacteria, UA RARE  RARE  POCT PREGNANCY, URINE     Status: None   Collection Time    05/07/14  6:26 PM      Result Value Ref Range   Preg Test, Ur NEGATIVE  NEGATIVE    MDM Sudafed & tylenol given while in MAU.   Assessment and Plan  A: 1. Abdominal pain, other specified site   2. Sore throat     P: Discharge in stable condition.  Stay hydrated.  Take OTC cold medicine PRN symptoms per instructions on bottle Take tylenol or ibuprofen PRN abdominal pain per bottle instructions Establish care with a PCP; resources given. Discussed reasons to go to emergency room.   Claudie ReveringErin B Lawrence,  Student-NP  05/07/2014, 7:24 PM  I have seen the patient with the resident/student and agree with the above.  Tawnya CrookHogan, Heather Donovan

## 2014-05-10 NOTE — MAU Provider Note (Signed)
Attestation of Attending Supervision of Advanced Practitioner (CNM/NP): Evaluation and management procedures were performed by the Advanced Practitioner under my supervision and collaboration.  I have reviewed the Advanced Practitioner's note and chart, and I agree with the management and plan.  HARRAWAY-SMITH, Blanchard Willhite 9:12 AM     

## 2014-06-04 ENCOUNTER — Encounter: Payer: Self-pay | Admitting: Obstetrics & Gynecology

## 2014-06-04 NOTE — Progress Notes (Deleted)
   Subjective:    Patient ID: Laura Hernandez, female    DOB: June 17, 1991, 23 y.o.   MRN: [id number]  HPI Comments: 23 y.o PMH chlamydia/trichomonas, HSV, BV     Review of Systems     Objective:   Physical Exam        Assessment & Plan:

## 2014-06-04 NOTE — Progress Notes (Signed)
This encounter was created in error - please disregard.

## 2014-08-25 ENCOUNTER — Encounter: Payer: Self-pay | Admitting: Obstetrics & Gynecology

## 2014-10-30 ENCOUNTER — Emergency Department (HOSPITAL_COMMUNITY)
Admission: EM | Admit: 2014-10-30 | Discharge: 2014-10-30 | Discharge status: Against Medical Advice | Payer: Self-pay | Attending: Emergency Medicine | Admitting: Emergency Medicine

## 2014-10-30 ENCOUNTER — Encounter (HOSPITAL_COMMUNITY): Payer: Self-pay | Admitting: Emergency Medicine

## 2014-10-30 ENCOUNTER — Emergency Department (HOSPITAL_COMMUNITY)
Admission: EM | Admit: 2014-10-30 | Discharge: 2014-10-30 | Disposition: A | Discharge status: Home / Self Care | Payer: Self-pay | Attending: Emergency Medicine | Admitting: Emergency Medicine

## 2014-10-30 DIAGNOSIS — R102 Pelvic and perineal pain: Secondary | ICD-10-CM | POA: Insufficient documentation

## 2014-10-30 DIAGNOSIS — R300 Dysuria: Secondary | ICD-10-CM | POA: Insufficient documentation

## 2014-10-30 DIAGNOSIS — Z0441 Encounter for examination and observation following alleged adult rape: Secondary | ICD-10-CM | POA: Insufficient documentation

## 2014-10-30 DIAGNOSIS — N898 Other specified noninflammatory disorders of vagina: Secondary | ICD-10-CM | POA: Insufficient documentation

## 2014-10-30 DIAGNOSIS — Z3202 Encounter for pregnancy test, result negative: Secondary | ICD-10-CM | POA: Insufficient documentation

## 2014-10-30 DIAGNOSIS — Z8619 Personal history of other infectious and parasitic diseases: Secondary | ICD-10-CM | POA: Insufficient documentation

## 2014-10-30 DIAGNOSIS — Y9389 Activity, other specified: Secondary | ICD-10-CM | POA: Insufficient documentation

## 2014-10-30 DIAGNOSIS — Z79899 Other long term (current) drug therapy: Secondary | ICD-10-CM | POA: Insufficient documentation

## 2014-10-30 DIAGNOSIS — Z8709 Personal history of other diseases of the respiratory system: Secondary | ICD-10-CM | POA: Insufficient documentation

## 2014-10-30 DIAGNOSIS — R51 Headache: Secondary | ICD-10-CM | POA: Insufficient documentation

## 2014-10-30 DIAGNOSIS — Y998 Other external cause status: Secondary | ICD-10-CM | POA: Insufficient documentation

## 2014-10-30 DIAGNOSIS — Y9289 Other specified places as the place of occurrence of the external cause: Secondary | ICD-10-CM | POA: Insufficient documentation

## 2014-10-30 DIAGNOSIS — Z91040 Latex allergy status: Secondary | ICD-10-CM | POA: Insufficient documentation

## 2014-10-30 DIAGNOSIS — R519 Headache, unspecified: Secondary | ICD-10-CM

## 2014-10-30 LAB — URINALYSIS, ROUTINE W REFLEX MICROSCOPIC
BILIRUBIN URINE: NEGATIVE
GLUCOSE, UA: NEGATIVE mg/dL
HGB URINE DIPSTICK: NEGATIVE
Ketones, ur: NEGATIVE mg/dL
Leukocytes, UA: NEGATIVE
Nitrite: NEGATIVE
PROTEIN: NEGATIVE mg/dL
Specific Gravity, Urine: 1.03 (ref 1.005–1.030)
Urobilinogen, UA: 0.2 mg/dL (ref 0.0–1.0)
pH: 6.5 (ref 5.0–8.0)

## 2014-10-30 LAB — WET PREP, GENITAL
Clue Cells Wet Prep HPF POC: NONE SEEN
TRICH WET PREP: NONE SEEN
Yeast Wet Prep HPF POC: NONE SEEN

## 2014-10-30 LAB — POC URINE PREG, ED: PREG TEST UR: NEGATIVE

## 2014-10-30 MED ORDER — METRONIDAZOLE 500 MG PO TABS
2000.0000 mg | ORAL_TABLET | Freq: Once | ORAL | Status: AC
  Administered 2014-10-30: 2000 mg via ORAL
  Filled 2014-10-30: qty 4

## 2014-10-30 MED ORDER — BUTALBITAL-APAP-CAFFEINE 50-325-40 MG PO TABS: 1.0000 | ORAL_TABLET | Freq: Four times a day (QID) | ORAL | Status: AC | PRN

## 2014-10-30 MED ORDER — AZITHROMYCIN 250 MG PO TABS
1000.0000 mg | ORAL_TABLET | Freq: Once | ORAL | Status: AC
  Administered 2014-10-30: 1000 mg via ORAL
  Filled 2014-10-30: qty 4

## 2014-10-30 MED ORDER — OXYCODONE-ACETAMINOPHEN 5-325 MG PO TABS
1.0000 | ORAL_TABLET | Freq: Once | ORAL | Status: AC
  Administered 2014-10-30: 1 via ORAL
  Filled 2014-10-30: qty 1

## 2014-10-30 MED ORDER — CEFTRIAXONE SODIUM 250 MG IJ SOLR
250.0000 mg | INTRAMUSCULAR | Status: DC
  Administered 2014-10-30: 250 mg via INTRAMUSCULAR
  Filled 2014-10-30: qty 250

## 2014-10-30 NOTE — Discharge Instructions (Signed)
Dysuria Dysuria is the medical term for pain with urination. There are many causes for dysuria, but urinary tract infection is the most common. If a urinalysis was performed it can show that there is a urinary tract infection. A urine culture confirms that you or your child is sick. You will need to follow up with a healthcare provider because:  If a urine culture was done you will need to know the culture results and treatment recommendations.  If the urine culture was positive, you or your child will need to be put on antibiotics or know if the antibiotics prescribed are the right antibiotics for your urinary tract infection.  If the urine culture is negative (no urinary tract infection), then other causes may need to be explored or antibiotics need to be stopped. Today laboratory work may have been done and there does not seem to be an infection. If cultures were done they will take at least 24 to 48 hours to be completed. Today x-rays may have been taken and they read as normal. No cause can be found for the problems. The x-rays may be re-read by a radiologist and you will be contacted if additional findings are made. You or your child may have been put on medications to help with this problem until you can see your primary caregiver. If the problems get better, see your primary caregiver if the problems return. If you were given antibiotics (medications which kill germs), take all of the mediations as directed for the full course of treatment.  If laboratory work was done, you need to find the results. Leave a telephone number where you can be reached. If this is not possible, make sure you find out how you are to get test results. HOME CARE INSTRUCTIONS   Drink lots of fluids. For adults, drink eight, 8 ounce glasses of clear juice or water a day. For children, replace fluids as suggested by your caregiver.  Empty the bladder often. Avoid holding urine for long periods of time.  After a bowel  movement, women should cleanse front to back, using each tissue only once.  Empty your bladder before and after sexual intercourse.  Take all the medicine given to you until it is gone. You may feel better in a few days, but TAKE ALL MEDICINE.  Avoid caffeine, tea, alcohol and carbonated beverages, because they tend to irritate the bladder.  In men, alcohol may irritate the prostate.  Only take over-the-counter or prescription medicines for pain, discomfort, or fever as directed by your caregiver.  If your caregiver has given you a follow-up appointment, it is very important to keep that appointment. Not keeping the appointment could result in a chronic or permanent injury, pain, and disability. If there is any problem keeping the appointment, you must call back to this facility for assistance. SEEK IMMEDIATE MEDICAL CARE IF:   Back pain develops.  A fever develops.  There is nausea (feeling sick to your stomach) or vomiting (throwing up).  Problems are no better with medications or are getting worse. MAKE SURE YOU:   Understand these instructions.  Will watch your condition.  Will get help right away if you are not doing well or get worse. Document Released: 07/08/2004 Document Revised: 01/02/2012 Document Reviewed: 05/15/2008 [facility] Patient Information 2015 [place], [place]. This information is not intended to replace advice given to you by your health care provider. Make sure you discuss any questions you have with your health care provider.  Headaches, Frequently Asked  Questions MIGRAINE HEADACHES Q: What is migraine? What causes it? How can I treat it? A: Generally, migraine headaches begin as a dull ache. Then they develop into a constant, throbbing, and pulsating pain. You may experience pain at the temples. You may experience pain at the front or back of one or both sides of the head. The pain is usually accompanied by a combination  of:  Nausea.  Vomiting.  Sensitivity to light and noise. Some people (about 15%) experience an aura (see below) before an attack. The cause of migraine is believed to be chemical reactions in the brain. Treatment for migraine may include over-the-counter or prescription medications. It may also include self-help techniques. These include relaxation training and biofeedback.  Q: What is an aura? A: About 15% of people with migraine get an "aura". This is a sign of neurological symptoms that occur before a migraine headache. You may see wavy or jagged lines, dots, or flashing lights. You might experience tunnel vision or blind spots in one or both eyes. The aura can include visual or auditory hallucinations (something imagined). It may include disruptions in smell (such as strange odors), taste or touch. Other symptoms include:  Numbness.  A "pins and needles" sensation.  Difficulty in recalling or speaking the correct word. These neurological events may last as long as 60 minutes. These symptoms will fade as the headache begins. Q: What is a trigger? A: Certain physical or environmental factors can lead to or "trigger" a migraine. These include:  Foods.  Hormonal changes.  Weather.  Stress. It is important to remember that triggers are different for everyone. To help prevent migraine attacks, you need to figure out which triggers affect you. Keep a headache diary. This is a good way to track triggers. The diary will help you talk to your healthcare professional about your condition. Q: Does weather affect migraines? A: Bright sunshine, hot, humid conditions, and drastic changes in barometric pressure may lead to, or "trigger," a migraine attack in some people. But studies have shown that weather does not act as a trigger for everyone with migraines. Q: What is the link between migraine and hormones? A: Hormones start and regulate many of your body's functions. Hormones keep your body in  balance within a constantly changing environment. The levels of hormones in your body are unbalanced at times. Examples are during menstruation, pregnancy, or menopause. That can lead to a migraine attack. In fact, about three quarters of all women with migraine report that their attacks are related to the menstrual cycle.  Q: Is there an increased risk of stroke for migraine sufferers? A: The likelihood of a migraine attack causing a stroke is very remote. That is not to say that migraine sufferers cannot have a stroke associated with their migraines. In persons under age 19, the most common associated factor for stroke is migraine headache. But over the course of a person's normal life span, the occurrence of migraine headache may actually be associated with a reduced risk of dying from cerebrovascular disease due to stroke.  Q: What are acute medications for migraine? A: Acute medications are used to treat the pain of the headache after it has started. Examples over-the-counter medications, NSAIDs, ergots, and triptans.  Q: What are the triptans? A: Triptans are the newest class of abortive medications. They are specifically targeted to treat migraine. Triptans are vasoconstrictors. They moderate some chemical reactions in the brain. The triptans work on receptors in your brain. Triptans help to restore  the balance of a neurotransmitter called serotonin. Fluctuations in levels of serotonin are thought to be a main cause of migraine.  Q: Are over-the-counter medications for migraine effective? A: Over-the-counter, or "OTC," medications may be effective in relieving mild to moderate pain and associated symptoms of migraine. But you should see your caregiver before beginning any treatment regimen for migraine.  Q: What are preventive medications for migraine? A: Preventive medications for migraine are sometimes referred to as "prophylactic" treatments. They are used to reduce the frequency, severity, and  length of migraine attacks. Examples of preventive medications include antiepileptic medications, antidepressants, beta-blockers, calcium channel blockers, and NSAIDs (nonsteroidal anti-inflammatory drugs). Q: Why are anticonvulsants used to treat migraine? A: During the past few years, there has been an increased interest in antiepileptic drugs for the prevention of migraine. They are sometimes referred to as "anticonvulsants". Both epilepsy and migraine may be caused by similar reactions in the brain.  Q: Why are antidepressants used to treat migraine? A: Antidepressants are typically used to treat people with depression. They may reduce migraine frequency by regulating chemical levels, such as serotonin, in the brain.  Q: What alternative therapies are used to treat migraine? A: The term "alternative therapies" is often used to describe treatments considered outside the scope of conventional Western medicine. Examples of alternative therapy include acupuncture, acupressure, and yoga. Another common alternative treatment is herbal therapy. Some herbs are believed to relieve headache pain. Always discuss alternative therapies with your caregiver before proceeding. Some herbal products contain arsenic and other toxins. TENSION HEADACHES Q: What is a tension-type headache? What causes it? How can I treat it? A: Tension-type headaches occur randomly. They are often the result of temporary stress, anxiety, fatigue, or anger. Symptoms include soreness in your temples, a tightening band-like sensation around your head (a "vice-like" ache). Symptoms can also include a pulling feeling, pressure sensations, and contracting head and neck muscles. The headache begins in your forehead, temples, or the back of your head and neck. Treatment for tension-type headache may include over-the-counter or prescription medications. Treatment may also include self-help techniques such as relaxation training and  biofeedback. CLUSTER HEADACHES Q: What is a cluster headache? What causes it? How can I treat it? A: Cluster headache gets its name because the attacks come in groups. The pain arrives with little, if any, warning. It is usually on one side of the head. A tearing or bloodshot eye and a runny nose on the same side of the headache may also accompany the pain. Cluster headaches are believed to be caused by chemical reactions in the brain. They have been described as the most severe and intense of any headache type. Treatment for cluster headache includes prescription medication and oxygen. SINUS HEADACHES Q: What is a sinus headache? What causes it? How can I treat it? A: When a cavity in the bones of the face and skull (a sinus) becomes inflamed, the inflammation will cause localized pain. This condition is usually the result of an allergic reaction, a tumor, or an infection. If your headache is caused by a sinus blockage, such as an infection, you will probably have a fever. An x-ray will confirm a sinus blockage. Your caregiver's treatment might include antibiotics for the infection, as well as antihistamines or decongestants.  REBOUND HEADACHES Q: What is a rebound headache? What causes it? How can I treat it? A: A pattern of taking acute headache medications too often can lead to a condition known as "rebound headache." A pattern  of taking too much headache medication includes taking it more than 2 days per week or in excessive amounts. That means more than the label or a caregiver advises. With rebound headaches, your medications not only stop relieving pain, they actually begin to cause headaches. Doctors treat rebound headache by tapering the medication that is being overused. Sometimes your caregiver will gradually substitute a different type of treatment or medication. Stopping may be a challenge. Regularly overusing a medication increases the potential for serious side effects. Consult a caregiver  if you regularly use headache medications more than 2 days per week or more than the label advises. ADDITIONAL QUESTIONS AND ANSWERS Q: What is biofeedback? A: Biofeedback is a self-help treatment. Biofeedback uses special equipment to monitor your body's involuntary physical responses. Biofeedback monitors:  Breathing.  Pulse.  Heart rate.  Temperature.  Muscle tension.  Brain activity. Biofeedback helps you refine and perfect your relaxation exercises. You learn to control the physical responses that are related to stress. Once the technique has been mastered, you do not need the equipment any more. Q: Are headaches hereditary? A: Four out of five (80%) of people that suffer report a family history of migraine. Scientists are not sure if this is genetic or a family predisposition. Despite the uncertainty, a child has a 50% chance of having migraine if one parent suffers. The child has a 75% chance if both parents suffer.  Q: Can children get headaches? A: By the time they reach high school, most young people have experienced some type of headache. Many safe and effective approaches or medications can prevent a headache from occurring or stop it after it has begun.  Q: What type of doctor should I see to diagnose and treat my headache? A: Start with your primary caregiver. Discuss his or her experience and approach to headaches. Discuss methods of classification, diagnosis, and treatment. Your caregiver may decide to recommend you to a headache specialist, depending upon your symptoms or other physical conditions. Having diabetes, allergies, etc., may require a more comprehensive and inclusive approach to your headache. The National [employer] Foundation will provide, upon request, a list of [facility] physician members in your state. Document Released: 12/31/2003 Document Revised: 01/02/2012 Document Reviewed: 06/09/2008 [facility] Patient Information 2015 [place], [place]. This information is not  intended to replace advice given to you by your health care provider. Make sure you discuss any questions you have with your health care provider.
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
## 2014-10-30 NOTE — ED Notes (Signed)
Pelvic exam preparations  have been made.
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
## 2014-11-01 LAB — GC/CHLAMYDIA PROBE AMP
CT PROBE, AMP APTIMA: NEGATIVE
GC Probe RNA: NEGATIVE

## 2014-11-03 ENCOUNTER — Telehealth (HOSPITAL_BASED_OUTPATIENT_CLINIC_OR_DEPARTMENT_OTHER): Payer: Self-pay | Admitting: *Deleted

## 2015-04-05 ENCOUNTER — Encounter (HOSPITAL_COMMUNITY): Payer: Self-pay | Admitting: Emergency Medicine

## 2015-04-05 ENCOUNTER — Emergency Department (HOSPITAL_COMMUNITY)
Admission: EM | Admit: 2015-04-05 | Discharge: 2015-04-05 | Disposition: A | Discharge status: Home / Self Care | Payer: Self-pay | Attending: Emergency Medicine | Admitting: Emergency Medicine

## 2015-04-05 DIAGNOSIS — Z91040 Latex allergy status: Secondary | ICD-10-CM | POA: Insufficient documentation

## 2015-04-05 DIAGNOSIS — Z79899 Other long term (current) drug therapy: Secondary | ICD-10-CM | POA: Insufficient documentation

## 2015-04-05 DIAGNOSIS — R300 Dysuria: Secondary | ICD-10-CM | POA: Insufficient documentation

## 2015-04-05 DIAGNOSIS — Z3202 Encounter for pregnancy test, result negative: Secondary | ICD-10-CM | POA: Insufficient documentation

## 2015-04-05 DIAGNOSIS — Z8709 Personal history of other diseases of the respiratory system: Secondary | ICD-10-CM | POA: Insufficient documentation

## 2015-04-05 DIAGNOSIS — Z8619 Personal history of other infectious and parasitic diseases: Secondary | ICD-10-CM | POA: Insufficient documentation

## 2015-04-05 DIAGNOSIS — Z8679 Personal history of other diseases of the circulatory system: Secondary | ICD-10-CM | POA: Insufficient documentation

## 2015-04-05 LAB — URINALYSIS, ROUTINE W REFLEX MICROSCOPIC
Bilirubin Urine: NEGATIVE
Glucose, UA: NEGATIVE mg/dL
Hgb urine dipstick: NEGATIVE
KETONES UR: NEGATIVE mg/dL
Leukocytes, UA: NEGATIVE
Nitrite: NEGATIVE
Protein, ur: NEGATIVE mg/dL
SPECIFIC GRAVITY, URINE: 1.025 (ref 1.005–1.030)
Urobilinogen, UA: 0.2 mg/dL (ref 0.0–1.0)
pH: 6 (ref 5.0–8.0)

## 2015-04-05 LAB — POC URINE PREG, ED: PREG TEST UR: NEGATIVE

## 2015-04-05 MED ORDER — CEFTRIAXONE SODIUM 250 MG IJ SOLR
250.0000 mg | Freq: Once | INTRAMUSCULAR | Status: AC
  Administered 2015-04-05: 250 mg via INTRAMUSCULAR
  Filled 2015-04-05: qty 250

## 2015-04-05 MED ORDER — AZITHROMYCIN 250 MG PO TABS
1000.0000 mg | ORAL_TABLET | Freq: Once | ORAL | Status: AC
  Administered 2015-04-05: 1000 mg via ORAL
  Filled 2015-04-05: qty 4

## 2015-04-05 MED ORDER — METRONIDAZOLE 500 MG PO TABS: 500.0000 mg | ORAL_TABLET | Freq: Two times a day (BID) | ORAL | Status: DC
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
## 2015-04-05 NOTE — Discharge Instructions (Signed)
You do not have a urinary infection -= this may be from an STD - you will receive a phone call in 2-3 days if you are positive for STD's.    Pelvic Infection  If you have been diagnosed with a pelvic infection such as a sexually transmitted disease, you will need to be treated with antibiotics. Please take the medicines as prescribed. Some of these tests do not come back for 1-2 days in which case if they turn positive you will receive a phone call to let you know. If you are contacted and do have an infection consistent with a sexually transmitted disease, then you will need to tell any and all sexual partners that you have had in the last 6 months no so that they can be tested and treated as well. If you should develop severe or worsening pain in your abdomen or the pelvis or develop severe fevers,nausea or vomiting that prevent you from taking your medications, return to the emergency department immediately. Otherwise contact your local physician or county health department for a follow up appointment to complete STD testing including HIV and syphilis.  See the list of phone numbers below.  RESOURCE GUIDE  Dental Problems  Patients with Medicaid: [facility] 229 820 [address]                                           [phone] [address] [employer] Phone:  [phone]                                                  Phone:  [phone]  If unable to pay or uninsured, contact:  [facility] or [facility]. to become qualified for the adult dental clinic.  Chronic Pain Problems Contact [facility]  [phone] Patients need to be referred by their primary care doctor.  Insufficient Money for Medicine Contact [employer]:  call "211" or [facility] [phone] 747-8246.  No Primary Care Doctor Call [facility]  ([phone] Other agencies that provide inexpensive medical care    [facility]  [phone]    [facility] Internal Medicine  [phone]    [facility]  [phone]    [facility]  [phone]    [facility]  [phone]    [facility]  [phone]  [facility]  [phone] [facility]  [phone] [facility] Mental Health   [phone] (emergency services [phone])  Substance Abuse Resources [facility]  256-613-2464 [facility] [phone] [facility] ([phone] 791-9062 Floyd[facility] Flock (534)674-5847 Residential & Outpatient Substance Abuse Program  564-481-8935  Abuse/Neglect [facility] Child Abuse Hotline [phone] 056-3116 [facility] Child Abuse Hotline 714-262-9899 (After Hours)  Emergency Shelter [facility] (443)348-6975  M[facility] Homes [facility] [place] [facility] [place] 343-546-9752 [facility] 8041440798  MRSA Hotline #:   (608) 358-8504    [facility]  [facility] °[address] [place]                       [address]      [address]  °[place]                                                [place]                            [place] °Phone:  [phone]                                   Phone:  [phone]                 Phone:  [phone] ° °[facility] °Phone:  342-8316 ° °[place] Child Abuse Hotline °(336) 342-1394 °(336) [phone] (After Hours) ° ° ° ° °

## 2015-04-05 NOTE — ED Notes (Signed)
Pt c/o lower medial abdominal pain x 4 days. Denies emesis, nausea, or diarrhea. Pt midway through finishing eating a burrito and drinking a smoothie from [employer] when called for triage.

## 2015-04-05 NOTE — ED Notes (Signed)
Pt called from triage, no answer 

## 2015-04-08 ENCOUNTER — Telehealth (HOSPITAL_BASED_OUTPATIENT_CLINIC_OR_DEPARTMENT_OTHER): Payer: Self-pay | Admitting: Emergency Medicine

## 2016-06-05 ENCOUNTER — Emergency Department (HOSPITAL_COMMUNITY): Payer: Self-pay

## 2016-06-05 ENCOUNTER — Encounter (HOSPITAL_COMMUNITY): Payer: Self-pay | Admitting: Emergency Medicine

## 2016-06-05 ENCOUNTER — Emergency Department (HOSPITAL_COMMUNITY)
Admission: EM | Admit: 2016-06-05 | Discharge: 2016-06-05 | Disposition: A | Discharge status: Home / Self Care | Payer: Self-pay | Attending: Emergency Medicine | Admitting: Emergency Medicine

## 2016-06-05 DIAGNOSIS — H1089 Other conjunctivitis: Secondary | ICD-10-CM | POA: Insufficient documentation

## 2016-06-05 DIAGNOSIS — R21 Rash and other nonspecific skin eruption: Secondary | ICD-10-CM | POA: Insufficient documentation

## 2016-06-05 DIAGNOSIS — H109 Unspecified conjunctivitis: Secondary | ICD-10-CM

## 2016-06-05 DIAGNOSIS — Z79899 Other long term (current) drug therapy: Secondary | ICD-10-CM | POA: Insufficient documentation

## 2016-06-05 LAB — CBC
HCT: 36.9 % (ref 36.0–46.0)
Hemoglobin: 11.5 g/dL — ABNORMAL LOW (ref 12.0–15.0)
MCH: 20.6 pg — AB (ref 26.0–34.0)
MCHC: 31.2 g/dL (ref 30.0–36.0)
MCV: 66.1 fL — ABNORMAL LOW (ref 78.0–100.0)
PLATELETS: 344 10*3/uL (ref 150–400)
RBC: 5.58 MIL/uL — AB (ref 3.87–5.11)
RDW: 16.3 % — ABNORMAL HIGH (ref 11.5–15.5)
WBC: 10.7 10*3/uL — ABNORMAL HIGH (ref 4.0–10.5)

## 2016-06-05 LAB — BASIC METABOLIC PANEL
Anion gap: 8 (ref 5–15)
BUN: 8 mg/dL (ref 6–20)
CO2: 25 mmol/L (ref 22–32)
Calcium: 9.2 mg/dL (ref 8.9–10.3)
Chloride: 101 mmol/L (ref 101–111)
Creatinine, Ser: 0.74 mg/dL (ref 0.44–1.00)
GFR calc Af Amer: >60 mL/min (ref >60)
Glucose, Bld: 88 mg/dL (ref 65–99)
POTASSIUM: 3.7 mmol/L (ref 3.5–5.1)
SODIUM: 134 mmol/L — AB (ref 135–145)

## 2016-06-05 LAB — POC URINE PREG, ED: PREG TEST UR: NEGATIVE

## 2016-06-05 MED ORDER — IOPAMIDOL (ISOVUE-300) INJECTION 61%
80.0000 mL | Freq: Once | INTRAVENOUS | Status: AC | PRN
  Administered 2016-06-05: 80 mL via INTRAVENOUS

## 2016-06-05 MED ORDER — DEXTROSE 5 % IV SOLN
2.0000 g | Freq: Once | INTRAVENOUS | Status: AC
  Administered 2016-06-05: 2 g via INTRAVENOUS
  Filled 2016-06-05 (×2): qty 2

## 2016-06-05 MED ORDER — CEPHALEXIN 500 MG PO CAPS: 500.0000 mg | ORAL_CAPSULE | Freq: Two times a day (BID) | ORAL | 0 refills | Status: DC

## 2016-06-05 MED ORDER — VANCOMYCIN HCL 10 G IV SOLR
1500.0000 mg | Freq: Two times a day (BID) | INTRAVENOUS | Status: DC
Start: 2016-06-06 — End: 2016-06-06
  Filled 2016-06-05: qty 1500

## 2016-06-05 MED ORDER — VANCOMYCIN HCL 500 MG IV SOLR
500.0000 mg | Freq: Once | INTRAVENOUS | Status: AC
  Administered 2016-06-05: 500 mg via INTRAVENOUS
  Filled 2016-06-05: qty 500

## 2016-06-05 MED ORDER — FLUORESCEIN SODIUM 1 MG OP STRP
1.0000 | ORAL_STRIP | Freq: Once | OPHTHALMIC | Status: AC
  Administered 2016-06-05: 1 via OPHTHALMIC
  Filled 2016-06-05: qty 1

## 2016-06-05 MED ORDER — POLYMYXIN B-TRIMETHOPRIM 10000-0.1 UNIT/ML-% OP SOLN: 1.0000 [drp] | OPHTHALMIC | 0 refills | Status: DC

## 2016-06-05 MED ORDER — VANCOMYCIN HCL IN DEXTROSE 1-5 GM/200ML-% IV SOLN
1000.0000 mg | Freq: Once | INTRAVENOUS | Status: AC
  Administered 2016-06-05: 1000 mg via INTRAVENOUS
  Filled 2016-06-05: qty 200

## 2016-06-05 MED ORDER — TETRACAINE HCL 0.5 % OP SOLN
2.0000 [drp] | Freq: Once | OPHTHALMIC | Status: AC
  Administered 2016-06-05: 2 [drp] via OPHTHALMIC
  Filled 2016-06-05: qty 4
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
## 2016-06-05 NOTE — ED Notes (Signed)
Bed: WA09 Expected date:  Expected time:  Means of arrival:  Comments: Room 27

## 2016-06-05 NOTE — ED Notes (Signed)
Patient transported to CT 

## 2016-06-05 NOTE — ED Provider Notes (Signed)
[facility] Provider Note   CSN: [id number] Arrival date & time: 06/05/16  1623  First Provider Contact:   First MD Initiated Contact with Patient 06/05/16 1702       By signing my name below, I, [name], attest that this documentation has been prepared under the direction and in the presence of Audry Pili, PA-C. Electronically Signed: [name], ED Scribe. 06/05/16. 5:10 PM.   History   Chief Complaint Chief Complaint  Patient presents with  . Eye Pain    left    HPI HPI Comments: Laura Hernandez is a 25 y.o. female who presents to the Emergency Department complaining of worsening, 8/10, atraumatic, left eye pain x 1 week. She reports associated blurry vision, eye redness, mild swelling around her left eye and photophobia. Her pain worsens with eye movement and she denies any known foreign objects entered her eye. Pt states she experienced a mild rash across her face 1 week ago but notes it has fully alleviated. She has taken tylenol, aspirin and ibuprofen w/o relief. She denies fevers or any other associated symptoms at this time.   The history is provided by the patient. No language interpreter was used.    Past Medical History:  Diagnosis Date  . Bronchitis   . Chlamydia   . Migraines 2012  . Trichomonas     Patient Active Problem List   Diagnosis Date Noted  . Supervision of low-risk first pregnancy 12/06/2013  . Chlamydia 09/18/2013  . History of trichomonal vaginitis 09/18/2013  . History of gonorrhea 09/18/2013  . HSV-2 (herpes simplex virus 2) infection 09/18/2013    Past Surgical History:  Procedure Laterality Date  . NO PAST SURGERIES      OB History    Gravida Para Term Preterm AB Living   1       1     SAB TAB Ectopic Multiple Live Births   1               Home Medications    Prior to Admission medications   Medication Sig Start Date End Date Taking? Authorizing Provider  acetaminophen (TYLENOL) 500 MG tablet Take 1,000 mg by  mouth 2 (two) times daily as needed for pain.     Historical Provider, MD  albuterol (PROVENTIL HFA;VENTOLIN HFA) 108 (90 BASE) MCG/ACT inhaler Inhale 2 puffs into the lungs every 6 (six) hours as needed for wheezing or shortness of breath.    Historical Provider, MD  diphenhydrAMINE (BENADRYL) 25 MG tablet Take 25 mg by mouth every 6 (six) hours as needed for allergies.    Historical Provider, MD  metroNIDAZOLE (FLAGYL) 500 MG tablet Take 1 tablet (500 mg total) by mouth 2 (two) times daily. 04/05/15   Eber Hong, MD    Family History Family History  Problem Relation Age of Onset  . Asthma Mother   . Arthritis Mother   . Diabetes Mother   . Heart disease Mother   . Hypertension Mother   . Stroke Mother   . Kidney disease Mother   . Vision loss Mother   . Diabetes Maternal Grandmother   . Hypertension Maternal Grandmother     Social History Social History  Substance Use Topics  . Smoking status: Never Smoker  . Smokeless tobacco: Never Used  . Alcohol use No     Comment: socially     Allergies   Latex   Review of Systems Review of Systems  Constitutional: Negative for chills and fever.  Eyes: Positive for photophobia, pain, redness, itching and visual disturbance (blurry vision).  Skin: Positive for rash.   Physical Exam Updated Vital Signs BP 119/94 (BP Location: Right Arm)   Pulse 80   Temp 98.5 F (36.9 C) (Oral)   Resp 18   Ht 5' (1.524 m)   LMP 04/29/2016 (Exact Date)   SpO2 100%   Physical Exam  Constitutional: She is oriented to person, place, and time. She appears well-developed and well-nourished.  HENT:  Head: Normocephalic and atraumatic.  Eyes: EOM are normal. Pupils are equal, round, and reactive to light.  Injected cornea; mild proptosis; pain with EOM; PERRL w/o discomfort; minimal periorbital swelling  Neck: Normal range of motion.  Cardiovascular: Normal rate.   Pulmonary/Chest: Effort normal. No respiratory distress.  Abdominal: Soft.    Musculoskeletal: Normal range of motion.  Neurological: She is alert and oriented to person, place, and time.  Skin: Skin is warm and dry.  Psychiatric: She has a normal mood and affect.  Nursing note and vitals reviewed.  ED Treatments / Results  Labs (all labs ordered are listed, but only abnormal results are displayed) Labs Reviewed  CBC - Abnormal; Notable for the following:       Result Value   WBC 10.7 (*)    RBC 5.58 (*)    Hemoglobin 11.5 (*)    MCV 66.1 (*)    MCH 20.6 (*)    RDW 16.3 (*)    All other components within normal limits  BASIC METABOLIC PANEL - Abnormal; Notable for the following:    Sodium 134 (*)    All other components within normal limits  CULTURE, BLOOD (ROUTINE X 2)  CULTURE, BLOOD (ROUTINE X 2)  POC URINE PREG, ED    EKG  EKG Interpretation None       Radiology Ct Orbits W Contrast  Result Date: 06/05/2016 CLINICAL DATA:  25 year old female with left eye pain swelling and blurred vision. Orbital cellulitis. No known injury. Initial encounter. EXAM: CT ORBITS WITH CONTRAST TECHNIQUE: Multidetector CT imaging of the orbits were performed following the standard protocol during bolus administration of intravenous contrast. CONTRAST:  80mL ISOVUE-300 IOPAMIDOL (ISOVUE-300) INJECTION 61%<Contrast>5280mL ISOVUE-300 IOPAMIDOL (ISOVUE-300) INJECTION 61% COMPARISON:  Head CT without contrast 02/26/2009. FINDINGS: Small area of bubbly opacity in the anterior left sphenoid sinus near the left sphenoid ostium (series 4, image 16). Otherwise Visualized paranasal sinuses and mastoids are stable and well pneumatized. No acute osseous abnormality identified. Partially visible impacted appearing maxillary wisdom teeth. Negative visualized brain parenchyma. Major vascular structures at the skullbase and in the upper neck are patent. Mild adenoid hypertrophy. Negative visualized parapharyngeal, retropharyngeal, and parotid spaces. Bilateral intraorbital soft tissues  appear symmetric and normal. No postseptal inflammatory changes. The globes and lacrimal glands appear symmetric and within normal limits. No asymmetric preseptal soft tissue changes identified. Again the bilateral ethmoid air cells are clear. Visible premalar soft tissues also appear symmetric and within normal limits. Right nose piercing incidentally noted. IMPRESSION: 1. Negative CT appearance of both orbits. No focal inflammation identified. 2. Mild bubbly opacity in the left sphenoid sinus could reflect minimal sinusitis. The ethmoid and other sinuses are clear. Electronically Signed   By: Odessa FlemingH  Hall M.D.   On: 06/05/2016 19:18    Procedures Procedures  DIAGNOSTIC STUDIES: Oxygen Saturation is 100% on RA, normal by my interpretation.    COORDINATION OF CARE: 5:09 PM Discussed next steps with pt. Pt verbalized understanding and is agreeable with the plan.  Medications Ordered in ED Medications  vancomycin (VANCOCIN) 1,500 mg in sodium chloride 0.9 % 500 mL IVPB (not administered)  tetracaine (PONTOCAINE) 0.5 % ophthalmic solution 2 drop (2 drops Both Eyes Given 06/05/16 1708)  fluorescein ophthalmic strip 1 strip (1 strip Both Eyes Given 06/05/16 1708)  cefTRIAXone (ROCEPHIN) 2 g in dextrose 5 % 50 mL IVPB (0 g Intravenous Stopped 06/05/16 1927)  vancomycin (VANCOCIN) IVPB 1000 mg/200 mL premix (0 mg Intravenous Stopped 06/05/16 2207)  iopamidol (ISOVUE-300) 61 % injection 80 mL (80 mLs Intravenous Contrast Given 06/05/16 1905)  vancomycin (VANCOCIN) 500 mg in sodium chloride 0.9 % 100 mL IVPB (0 mg Intravenous Stopped 06/05/16 2207)    Initial Impression / Assessment and Plan / ED Course  I have reviewed the triage vital signs and the nursing notes.  Pertinent labs & imaging results that were available during my care of the patient were reviewed by me and considered in my medical decision making (see chart for details).  Clinical Course    Final Clinical Impressions(s) / ED Diagnoses  I  have reviewed and evaluated the relevant laboratory values I have reviewed and evaluated the relevant imaging studies.  I have reviewed the relevant previous healthcare records. I obtained HPI from historian. Patient discussed with supervising physician  ED Course:  Assessment: Pt is a 24yF who presents with left eye pain x 1 week. Noted rash on face previous that has improved. None today. Notes worsening symptoms with pain on ROM. On exam, pt in NAD. Nontoxic/nonseptic appearing. VSS. Afebrile. Left eye with pain on EOM. Pupils reactive without pain. Chemosis noted. Fluro exam without abrasion. Given Vanc/Rocephin. Labs unremarkable. CT orbits unremarkable for cellulitis. Consult to [facility]) recommends ABX and will have close follow up tomorrow at 8:30. Likely bacterial conjunctivitis. Plan is to DC home with close follow up to Optho. Given Rx ABX. At time of discharge, Patient is in no acute distress. Vital Signs are stable. Patient is able to ambulate. Patient able to tolerate PO.    Disposition/Plan:  DC Home Additional Verbal discharge instructions given and discussed with patient.  Pt Instructed to f/u with Optho in the next week for evaluation and treatment of symptoms. Return precautions given Pt acknowledges and agrees with plan  Supervising Physician Shaune Pollack, MD   Final diagnoses:  Conjunctivitis, bacterial    I personally performed the services described in this documentation, which was scribed in my presence. The recorded information has been reviewed and is accurate.   New Prescriptions New Prescriptions   No medications on file     Audry Pili, PA-C 06/05/16 2212    Shaune Pollack, MD 06/07/16 0830

## 2016-06-05 NOTE — ED Notes (Signed)
Pt transported to room from CT.

## 2016-06-05 NOTE — ED Triage Notes (Signed)
Patient c/o bilateral eye pain. Left worse than right, states it is very painful and that she has a hard time seeing out of it. Patient eye is reddened, as she is rubbing it. Patient encouraged to not rub her eye. Patient also reports the eye is draining.

## 2016-06-05 NOTE — Progress Notes (Addendum)
Pharmacy Antibiotic Note  Laura Hernandez is a 25 y.o. female presented to the ED on 8/13 with c/o bilateral  eye pain. To start vancomycin for orbital cellulitis.   Plan: - vancomycin 1 gm x1  (goal 10-15) - will f/u with labs and order maintenance regimen based on renal function   Adden: scr 0.74 (crcl>100) - give an additional vancomycin 500 mg IV x1 to get 1500 mg total, then 1500 mg IV q12h ___________________  Height: 5' (152.4 cm) IBW/kg (Calculated) : 45.5  Weight: 84 kg  Temp (24hrs), Avg:98.5 F (36.9 C), Min:98.5 F (36.9 C), Max:98.5 F (36.9 C)  No results for input(s): WBC, CREATININE, LATICACIDVEN, VANCOTROUGH, VANCOPEAK, VANCORANDOM, GENTTROUGH, GENTPEAK, GENTRANDOM, TOBRATROUGH, TOBRAPEAK, TOBRARND, AMIKACINPEAK, AMIKACINTROU, AMIKACIN in the last 168 hours.  CrCl cannot be calculated (Unknown ideal weight.).    Allergies  Allergen Reactions  . Latex Itching and Rash     Thank you for allowing pharmacy to be a part of this patient's care.  Lucia Gaskinsham, Karmello Abercrombie P 06/05/2016 5:32 PM

## 2016-06-05 NOTE — ED Notes (Signed)
Bed: [id number] Expected date:  Expected time:  Means of arrival:  Comments: drying

## 2016-06-05 NOTE — ED Triage Notes (Signed)
1745 Blood culture drawn LAC x1

## 2016-06-10 LAB — CULTURE, BLOOD (ROUTINE X 2)
CULTURE: NO GROWTH
Culture: NO GROWTH

## 2017-08-11 ENCOUNTER — Emergency Department (HOSPITAL_COMMUNITY): Admission: EM | Admit: 2017-08-11 | Discharge: 2017-08-11 | Discharge status: Absent without leave | Payer: Self-pay

## 2017-09-30 ENCOUNTER — Encounter (HOSPITAL_COMMUNITY): Payer: Self-pay | Admitting: Emergency Medicine

## 2017-09-30 ENCOUNTER — Ambulatory Visit (HOSPITAL_COMMUNITY)
Admission: EM | Admit: 2017-09-30 | Discharge: 2017-09-30 | Disposition: A | Discharge status: Home / Self Care | Payer: Self-pay | Attending: Emergency Medicine | Admitting: Emergency Medicine

## 2017-09-30 ENCOUNTER — Other Ambulatory Visit: Payer: Self-pay

## 2017-09-30 DIAGNOSIS — R112 Nausea with vomiting, unspecified: Secondary | ICD-10-CM

## 2017-09-30 DIAGNOSIS — K2900 Acute gastritis without bleeding: Secondary | ICD-10-CM

## 2017-09-30 LAB — POCT URINALYSIS DIP (DEVICE)
BILIRUBIN URINE: NEGATIVE
Glucose, UA: NEGATIVE mg/dL
Ketones, ur: NEGATIVE mg/dL
NITRITE: NEGATIVE
PH: 6 (ref 5.0–8.0)
Protein, ur: NEGATIVE mg/dL
Specific Gravity, Urine: 1.025 (ref 1.005–1.030)
UROBILINOGEN UA: 0.2 mg/dL (ref 0.0–1.0)

## 2017-09-30 LAB — POCT PREGNANCY, URINE: PREG TEST UR: NEGATIVE

## 2017-09-30 MED ORDER — ONDANSETRON HCL 4 MG PO TABS: 4.0000 mg | ORAL_TABLET | Freq: Four times a day (QID) | ORAL | 0 refills | Status: DC

## 2017-09-30 NOTE — ED Triage Notes (Signed)
Pt reports N&V since Wednesday.

## 2017-09-30 NOTE — ED Provider Notes (Signed)
[facility]    CSN: [id number] Arrival date & time: 09/30/17  1159     History   Chief Complaint Chief Complaint  Patient presents with  . Nausea  . Emesis    HPI Laura Hernandez is a 26 y.o. female.   26 year old female complaining of constant nausea for the past 3 days. She is also had vomiting especially after eating. She has been eating fast foods this week. She has sharp abdominal pain that comes and goes. The pain also is worse after eating. No diarrhea. No fever or chills. She vomited once today, yesterday twice and 2 days ago 3 times. LMP, states she is about 3 days overdue.  Denies lower abdominal pain or GU symptoms.      Past Medical History:  Diagnosis Date  . Bronchitis   . Chlamydia   . Migraines 2012  . Trichomonas     Patient Active Problem List   Diagnosis Date Noted  . Supervision of low-risk first pregnancy 12/06/2013  . Chlamydia 09/18/2013  . History of trichomonal vaginitis 09/18/2013  . History of gonorrhea 09/18/2013  . HSV-2 (herpes simplex virus 2) infection 09/18/2013    Past Surgical History:  Procedure Laterality Date  . NO PAST SURGERIES      OB History    Gravida Para Term Preterm AB Living   1       1     SAB TAB Ectopic Multiple Live Births   1               Home Medications    Prior to Admission medications   Medication Sig Start Date End Date Taking? Authorizing Provider  acetaminophen (TYLENOL) 500 MG tablet Take 1,000 mg by mouth 2 (two) times daily as needed for pain.     [provider]  albuterol (PROVENTIL HFA;VENTOLIN HFA) 108 (90 BASE) MCG/ACT inhaler Inhale 2 puffs into the lungs every 6 (six) hours as needed for wheezing or shortness of breath.    [provider]  diphenhydrAMINE (BENADRYL) 25 MG tablet Take 25 mg by mouth every 6 (six) hours as needed for allergies.    [provider]  ondansetron (ZOFRAN) 4 MG tablet Take 1 tablet (4 mg total) by mouth every 6 (six)  hours. 09/30/17   Hayden Rasmussen, NP    Family History Family History  Problem Relation Age of Onset  . Asthma Mother   . Arthritis Mother   . Diabetes Mother   . Heart disease Mother   . Hypertension Mother   . Stroke Mother   . Kidney disease Mother   . Vision loss Mother   . Diabetes Maternal Grandmother   . Hypertension Maternal Grandmother     Social History Social History   Tobacco Use  . Smoking status: Never Smoker  . Smokeless tobacco: Never Used  Substance Use Topics  . Alcohol use: No    Comment: socially  . Drug use: No     Allergies   Latex   Review of Systems Review of Systems  Constitutional: Negative.  Negative for fever.  HENT: Negative.   Respiratory: Negative.   Gastrointestinal: Positive for abdominal pain, nausea and vomiting. Negative for blood in stool, constipation and diarrhea.  Genitourinary: Negative.  Negative for dysuria, frequency, pelvic pain, urgency, vaginal bleeding and vaginal discharge.  Neurological: Positive for headaches. Negative for dizziness and speech difficulty.  All other systems reviewed and are negative.    Physical Exam Triage Vital Signs ED  Triage Vitals  Enc Vitals Group     BP 09/30/17 1218 114/66     Pulse Rate 09/30/17 1218 70     Resp --      Temp 09/30/17 1218 98.7 F (37.1 C)     Temp Source 09/30/17 1218 Oral     SpO2 09/30/17 1218 100 %     Weight --      Height --      Head Circumference --      Peak Flow --      Pain Score 09/30/17 1216 6     Pain Loc --      Pain Edu? --      Excl. in GC? --    No data found.  Updated Vital Signs BP 114/66 (BP Location: Left Arm)   Pulse 70   Temp 98.7 F (37.1 C) (Oral)   LMP 09/11/2017 (Approximate)   SpO2 100%   Visual Acuity Right Eye Distance:   Left Eye Distance:   Bilateral Distance:    Right Eye Near:   Left Eye Near:    Bilateral Near:     Physical Exam  Constitutional: She is oriented to person, place, and time. She appears  well-developed and well-nourished. No distress.  Eyes: EOM are normal.  Neck: Normal range of motion. Neck supple.  Cardiovascular: Normal rate, regular rhythm and normal heart sounds.  Pulmonary/Chest: Effort normal and breath sounds normal. No respiratory distress.  Abdominal: Soft. Bowel sounds are normal. She exhibits no distension and no mass. There is no tenderness. There is no rebound and no guarding.  Musculoskeletal: She exhibits no edema.  Neurological: She is alert and oriented to person, place, and time. She exhibits normal muscle tone.  Skin: Skin is warm and dry.  Psychiatric: She has a normal mood and affect.  Nursing note and vitals reviewed.    UC Treatments / Results  Labs (all labs ordered are listed, but only abnormal results are displayed) Labs Reviewed  POCT URINALYSIS DIP (DEVICE) - Abnormal; Notable for the following components:      Result Value   Hgb urine dipstick TRACE (*)    Leukocytes, UA TRACE (*)    All other components within normal limits  POCT PREGNANCY, URINE   Results for orders placed or performed during the hospital encounter of 09/30/17  POCT urinalysis dip (device)  Result Value Ref Range   Glucose, UA NEGATIVE NEGATIVE mg/dL   Bilirubin Urine NEGATIVE NEGATIVE   Ketones, ur NEGATIVE NEGATIVE mg/dL   Specific Gravity, Urine 1.025 1.005 - 1.030   Hgb urine dipstick TRACE (A) NEGATIVE   pH 6.0 5.0 - 8.0   Protein, ur NEGATIVE NEGATIVE mg/dL   Urobilinogen, UA 0.2 0.0 - 1.0 mg/dL   Nitrite NEGATIVE NEGATIVE   Leukocytes, UA TRACE (A) NEGATIVE  Pregnancy, urine POC  Result Value Ref Range   Preg Test, Ur NEGATIVE NEGATIVE     EKG  EKG Interpretation None       Radiology No results found.  Procedures Procedures (including critical care time)  Medications Ordered in UC Medications - No data to display   Initial Impression / Assessment and Plan / UC Course  I have reviewed the triage vital signs and the nursing  notes.  Pertinent labs & imaging results that were available during my care of the patient were reviewed by me and considered in my medical decision making (see chart for details).    This is likely a virus. Stop eating  fast foods. Start drinking clear liquids only for the next 1-2 days. Take Zofran for nausea and vomiting. Gradually start eating foods such as crackers and toast in small amounts. Most importantly pain is to  Drink fluids. No milk or dairy products. You may need to drink a small amount at a time but more frequently.     Final Clinical Impressions(s) / UC Diagnoses   Final diagnoses:  Other acute gastritis without hemorrhage  Non-intractable vomiting with nausea, unspecified vomiting type    ED Discharge Orders        Ordered    ondansetron (ZOFRAN) 4 MG tablet  Every 6 hours     09/30/17 1253       Controlled Substance Prescriptions [place] Controlled Substance Registry consulted? Not Applicable   Hayden RasmussenMabe, Ibrohim Simmers, NP 09/30/17 1255

## 2017-09-30 NOTE — Discharge Instructions (Signed)
This is likely a virus. Stop eating fast foods. Start drinking clear liquids only for the next 1-2 days. Take Zofran for nausea and vomiting. Gradually start eating foods such as crackers and toast in small amounts. Most importantly pain is to  Drink fluids. No milk or dairy products. You may need to drink a small amount at a time but more frequently.

## 2018-12-04 ENCOUNTER — Encounter (HOSPITAL_COMMUNITY): Payer: Self-pay | Admitting: Emergency Medicine

## 2018-12-04 ENCOUNTER — Ambulatory Visit (HOSPITAL_COMMUNITY)
Admission: EM | Admit: 2018-12-04 | Discharge: 2018-12-04 | Disposition: A | Discharge status: Home / Self Care | Payer: Self-pay | Attending: Family Medicine | Admitting: Family Medicine

## 2018-12-04 DIAGNOSIS — R69 Illness, unspecified: Secondary | ICD-10-CM | POA: Insufficient documentation

## 2018-12-04 DIAGNOSIS — J111 Influenza due to unidentified influenza virus with other respiratory manifestations: Secondary | ICD-10-CM

## 2018-12-04 LAB — POCT URINALYSIS DIP (DEVICE)
BILIRUBIN URINE: NEGATIVE
GLUCOSE, UA: NEGATIVE mg/dL
Hgb urine dipstick: NEGATIVE
KETONES UR: NEGATIVE mg/dL
Nitrite: NEGATIVE
PROTEIN: 100 mg/dL — AB
Specific Gravity, Urine: 1.025 (ref 1.005–1.030)
Urobilinogen, UA: 0.2 mg/dL (ref 0.0–1.0)
pH: 6 (ref 5.0–8.0)

## 2018-12-04 LAB — POCT PREGNANCY, URINE: PREG TEST UR: NEGATIVE

## 2018-12-04 MED ORDER — PREDNISONE 50 MG PO TABS: 50.0000 mg | ORAL_TABLET | Freq: Every day | ORAL | 0 refills | Status: DC

## 2018-12-04 MED ORDER — ONDANSETRON 4 MG PO TBDP
4.0000 mg | ORAL_TABLET | Freq: Once | ORAL | Status: AC
  Administered 2018-12-04: 4 mg via ORAL

## 2018-12-04 MED ORDER — ALBUTEROL SULFATE HFA 108 (90 BASE) MCG/ACT IN AERS: 1.0000 | INHALATION_SPRAY | Freq: Four times a day (QID) | RESPIRATORY_TRACT | 0 refills | Status: DC | PRN

## 2018-12-04 MED ORDER — ONDANSETRON 4 MG PO TBDP: 4.0000 mg | ORAL_TABLET | Freq: Three times a day (TID) | ORAL | 0 refills | Status: DC | PRN

## 2018-12-04 NOTE — ED Triage Notes (Signed)
Pt presents to [facility] for assessment of diarrhea, coughing, congestion, nausea, fevers (100), chest pain with cough, shortness of breath, and headache.

## 2018-12-04 NOTE — ED Provider Notes (Signed)
[facility]    CSN: [id number] Arrival date & time: 12/04/18  1155     History   Chief Complaint Chief Complaint  Patient presents with  . URI    HPI Laura Hernandez is a 28 y.o. female.   28 year old female comes in for 4-day history of flulike symptoms.  States has had fever, generalized body aches, fever, chills.  Has had rhinorrhea, nasal congestion, cough.  Has had nausea with one episode of nonbilious nonbloody vomit.  Loose diarrhea, average about 3 times a day.  Generalized abdominal pain that is intermittent, worse prior to diarrhea, improved after bowel movement.  Denies melena, hematochezia.  States she has urinary frequency without dysuria or hematuria.  She has been feeling some shortness of breath, stating she cannot take deep breaths.  And dyspnea on exertion.  She has chest soreness and back pain with cough.  And frontal headache.  She has been taking DayQuil and NyQuil without relief.  Never smoker.  No obvious sick contact.  No flu shot this year.     Past Medical History:  Diagnosis Date  . Bronchitis   . Chlamydia   . Migraines 2012  . Trichomonas     Patient Active Problem List   Diagnosis Date Noted  . Supervision of low-risk first pregnancy 12/06/2013  . Chlamydia 09/18/2013  . History of trichomonal vaginitis 09/18/2013  . History of gonorrhea 09/18/2013  . HSV-2 (herpes simplex virus 2) infection 09/18/2013    Past Surgical History:  Procedure Laterality Date  . NO PAST SURGERIES      OB History    Gravida  1   Para      Term      Preterm      AB  1   Living        SAB  1   TAB      Ectopic      Multiple      Live Births               Home Medications    Prior to Admission medications   Medication Sig Start Date End Date Taking? Authorizing Provider  acetaminophen (TYLENOL) 500 MG tablet Take 1,000 mg by mouth 2 (two) times daily as needed for pain.    Yes [provider]  albuterol (PROVENTIL  HFA;VENTOLIN HFA) 108 (90 Base) MCG/ACT inhaler Inhale 1-2 puffs into the lungs every 6 (six) hours as needed for wheezing or shortness of breath. 12/04/18   Cathie HoopsYu, Artina Minella V, PA-C  diphenhydrAMINE (BENADRYL) 25 MG tablet Take 25 mg by mouth every 6 (six) hours as needed for allergies.    [provider]  ondansetron (ZOFRAN ODT) 4 MG disintegrating tablet Take 1 tablet (4 mg total) by mouth every 8 (eight) hours as needed for nausea or vomiting. 12/04/18   Cathie HoopsYu, Other Atienza V, PA-C  predniSONE (DELTASONE) 50 MG tablet Take 1 tablet (50 mg total) by mouth daily. 12/04/18   Belinda FisherYu, Patty Leitzke V, PA-C    Family History Family History  Problem Relation Age of Onset  . Asthma Mother   . Arthritis Mother   . Diabetes Mother   . Heart disease Mother   . Hypertension Mother   . Stroke Mother   . Kidney disease Mother   . Vision loss Mother   . Diabetes Maternal Grandmother   . Hypertension Maternal Grandmother     Social History Social History   Tobacco Use  . Smoking status: Never Smoker  .  Smokeless tobacco: Never Used  Substance Use Topics  . Alcohol use: No    Comment: socially  . Drug use: No     Allergies   Latex   Review of Systems Review of Systems  Reason unable to perform ROS: See HPI as above.     Physical Exam Triage Vital Signs ED Triage Vitals  Enc Vitals Group     BP 12/04/18 1245 114/70     Pulse Rate 12/04/18 1245 95     Resp 12/04/18 1245 20     Temp 12/04/18 1245 99.3 F (37.4 C)     Temp Source 12/04/18 1245 Temporal     SpO2 12/04/18 1245 100 %     Weight --      Height --      Head Circumference --      Peak Flow --      Pain Score 12/04/18 1246 10     Pain Loc --      Pain Edu? --      Excl. in GC? --    No data found.  Updated Vital Signs BP 114/70 (BP Location: Right Arm)   Pulse 95   Temp 99.3 F (37.4 C) (Temporal)   Resp 20   LMP 11/10/2018   SpO2 100%   Physical Exam Constitutional:      General: She is not in acute distress.     Appearance: She is well-developed. She is not ill-appearing or toxic-appearing.  HENT:     Head: Normocephalic and atraumatic.     Right Ear: Tympanic membrane, ear canal and external ear normal. Tympanic membrane is not erythematous or bulging.     Left Ear: Tympanic membrane, ear canal and external ear normal. Tympanic membrane is not erythematous or bulging.     Nose: No congestion or rhinorrhea.     Right Sinus: Maxillary sinus tenderness and frontal sinus tenderness present.     Left Sinus: Maxillary sinus tenderness and frontal sinus tenderness present.     Mouth/Throat:     Mouth: Mucous membranes are moist.     Pharynx: Oropharynx is clear. Uvula midline.  Eyes:     Conjunctiva/sclera: Conjunctivae normal.     Pupils: Pupils are equal, round, and reactive to light.  Neck:     Musculoskeletal: Normal range of motion and neck supple.  Cardiovascular:     Rate and Rhythm: Normal rate and regular rhythm.     Heart sounds: Normal heart sounds. No murmur. No friction rub. No gallop.   Pulmonary:     Effort: Pulmonary effort is normal. No tachypnea, accessory muscle usage, prolonged expiration, respiratory distress or retractions.     Breath sounds: Normal breath sounds. No stridor, decreased air movement or transmitted upper airway sounds. No decreased breath sounds, wheezing, rhonchi or rales.     Comments: Patient speaking in full sentences without difficulty.  Taking deep breaths without difficulty. Abdominal:     General: Bowel sounds are normal.     Palpations: Abdomen is soft.     Tenderness: There is no abdominal tenderness. There is no right CVA tenderness, left CVA tenderness, guarding or rebound.  Lymphadenopathy:     Cervical: No cervical adenopathy.  Skin:    General: Skin is warm and dry.  Neurological:     Mental Status: She is alert and oriented to person, place, and time.  Psychiatric:        Behavior: Behavior normal.        Judgment: Judgment  normal.       UC Treatments / Results  Labs (all labs ordered are listed, but only abnormal results are displayed) Labs Reviewed  POCT URINALYSIS DIP (DEVICE) - Abnormal; Notable for the following components:      Result Value   Protein, ur 100 (*)    Leukocytes,Ua TRACE (*)    All other components within normal limits  URINE CULTURE  POC URINE PREG, ED  POCT PREGNANCY, URINE    EKG None  Radiology No results found.  Procedures Procedures (including critical care time)  Medications Ordered in UC Medications  ondansetron (ZOFRAN-ODT) disintegrating tablet 4 mg (has no administration in time range)    Initial Impression / Assessment and Plan / UC Course  I have reviewed the triage vital signs and the nursing notes.  Pertinent labs & imaging results that were available during my care of the patient were reviewed by me and considered in my medical decision making (see chart for details).    Dipstick with trace leukocytes, will send for culture. Flu like symptoms, outside of tamiflu treatment range. Will start prednisone for sinus pressure/cough. zofran for nausea/vomiting. Patient requesting albuterol treatment, her lungs are clear to auscultation bilaterally without adventitious lung sounds, she has good air movement. She is afebrile, without tachycardia, tachypnea, O2 sat 100%. Will send albuterol inhaler as needed, but discussed with patient no indications for albuterol neb treatment at this time. Other symptomatic treatment discussed. Push fluids. Return precautions given. Patient expresses understanding and agrees to plan.  Final Clinical Impressions(s) / UC Diagnoses   Final diagnoses:  Influenza-like illness    ED Prescriptions    Medication Sig Dispense Auth. Provider   predniSONE (DELTASONE) 50 MG tablet Take 1 tablet (50 mg total) by mouth daily. 5 tablet Airen Dales V, PA-C   ondansetron (ZOFRAN ODT) 4 MG disintegrating tablet Take 1 tablet (4 mg total) by mouth every 8  (eight) hours as needed for nausea or vomiting. 20 tablet Alban Marucci V, PA-C   albuterol (PROVENTIL HFA;VENTOLIN HFA) 108 (90 Base) MCG/ACT inhaler Inhale 1-2 puffs into the lungs every 6 (six) hours as needed for wheezing or shortness of breath. 1 Inhaler Threasa Alpha, [place] 12/04/18 1342

## 2018-12-04 NOTE — Discharge Instructions (Signed)
Start prednisone for cough/sinus pressure. Zofran as needed for nausea/vomiting. You can use over the counter flonase for nasal congestion/drainage. You can use over the counter nasal saline rinse such as neti pot for nasal congestion. Keep hydrated, your urine should be clear to pale yellow in color. Tylenol/motrin for fever and pain. Monitor for any worsening of symptoms, chest pain, shortness of breath, wheezing, swelling of the throat, follow up for reevaluation.   For sore throat/cough try using a honey-based tea. Use 3 teaspoons of honey with juice squeezed from half lemon. Place shaved pieces of ginger into 1/2-1 cup of water and warm over stove top. Then mix the ingredients and repeat every 4 hours as needed.

## 2018-12-05 LAB — URINE CULTURE: Culture: NO GROWTH

## 2019-02-13 ENCOUNTER — Encounter (HOSPITAL_COMMUNITY): Payer: Self-pay | Admitting: Emergency Medicine

## 2019-02-13 ENCOUNTER — Ambulatory Visit (HOSPITAL_COMMUNITY)
Admission: EM | Admit: 2019-02-13 | Discharge: 2019-02-13 | Disposition: A | Discharge status: Home / Self Care | Payer: Self-pay | Attending: Family Medicine | Admitting: Family Medicine

## 2019-02-13 ENCOUNTER — Ambulatory Visit (INDEPENDENT_AMBULATORY_CARE_PROVIDER_SITE_OTHER): Payer: Self-pay

## 2019-02-13 ENCOUNTER — Other Ambulatory Visit: Payer: Self-pay

## 2019-02-13 DIAGNOSIS — S93492A Sprain of other ligament of left ankle, initial encounter: Secondary | ICD-10-CM

## 2019-02-13 LAB — POCT PREGNANCY, URINE: Preg Test, Ur: NEGATIVE

## 2019-02-13 MED ORDER — NAPROXEN 500 MG PO TABS: 500.0000 mg | ORAL_TABLET | Freq: Two times a day (BID) | ORAL | 0 refills | Status: DC

## 2019-02-13 NOTE — ED Triage Notes (Signed)
Pt sts left ankle pain after falling and twisting yesterday

## 2019-02-13 NOTE — ED Provider Notes (Signed)
[facility]   [id number] 02/13/19 Arrival Time: 1259  CC: Left ankle pain  SUBJECTIVE: History from: patient. Laura Hernandez is a 28 y.o. female complains of left ankle pain that began yesterday.  States she stepped on an object, inverted her ankle, and fell.  Localizes the pain to the outside of ankle.  Describes the pain as constant, burning and throbbing in character.  "10.5"/10.  Has tried icing with relief.  Symptoms are made worse with weight-bearing.  Denies similar symptoms in the past. Complains of swelling. Denies fever, chills, erythema, ecchymosis, weakness, numbness and tingling.      ROS: As per HPI.  Past Medical History:  Diagnosis Date  . Bronchitis   . Chlamydia   . Migraines 2012  . Trichomonas    Past Surgical History:  Procedure Laterality Date  . NO PAST SURGERIES     Allergies  Allergen Reactions  . Latex Itching and Rash   No current facility-administered medications on file prior to encounter.    Current Outpatient Medications on File Prior to Encounter  Medication Sig Dispense Refill  . acetaminophen (TYLENOL) 500 MG tablet Take 1,000 mg by mouth 2 (two) times daily as needed for pain.     Marland Kitchen albuterol (PROVENTIL HFA;VENTOLIN HFA) 108 (90 Base) MCG/ACT inhaler Inhale 1-2 puffs into the lungs every 6 (six) hours as needed for wheezing or shortness of breath. 1 Inhaler 0  . diphenhydrAMINE (BENADRYL) 25 MG tablet Take 25 mg by mouth every 6 (six) hours as needed for allergies.     Social History   Socioeconomic History  . Marital status: Single    Spouse name: Not on file  . Number of children: Not on file  . Years of education: Not on file  . Highest education level: Not on file  Occupational History  . Not on file  Social Needs  . Financial resource strain: Not on file  . Food insecurity:    Worry: Not on file    Inability: Not on file  . Transportation needs:    Medical: Not on file    Non-medical: Not on file  Tobacco Use  .  Smoking status: Never Smoker  . Smokeless tobacco: Never Used  Substance and Sexual Activity  . Alcohol use: No    Comment: socially  . Drug use: No  . Sexual activity: Yes    Birth control/protection: Condom, None  Lifestyle  . Physical activity:    Days per week: Not on file    Minutes per session: Not on file  . Stress: Not on file  Relationships  . Social connections:    Talks on phone: Not on file    Gets together: Not on file    Attends religious service: Not on file    Active member of club or organization: Not on file    Attends meetings of clubs or organizations: Not on file    Relationship status: Not on file  . Intimate partner violence:    Fear of current or ex partner: Not on file    Emotionally abused: Not on file    Physically abused: Not on file    Forced sexual activity: Not on file  Other Topics Concern  . Not on file  Social History Narrative  . Not on file   Family History  Problem Relation Age of Onset  . Asthma Mother   . Arthritis Mother   . Diabetes Mother   . Heart disease Mother   .  Hypertension Mother   . Stroke Mother   . Kidney disease Mother   . Vision loss Mother   . Diabetes Maternal Grandmother   . Hypertension Maternal Grandmother     OBJECTIVE:  Vitals:   02/13/19 1314  BP: 100/67  Pulse: 80  Resp: 18  Temp: 98.1 F (36.7 C)  TempSrc: Oral  SpO2: 98%    General appearance: Alert; in no acute distress; appears uncomfortable Head: NCAT Lungs: Normal respiratory effort CV: Dorsalis pedis pulse 2+; cap refill < 2 seconds Musculoskeletal: Left ankle Inspection: Skin warm, dry, clear and intact without obvious erythema, effusion, or ecchymosis. Mild swelling over lateral ankle Palpation: TTP over lateral malleolus, and over the ATFL ROM: LROM about the ankle Strength: 4+/5 dorsiflexion, 4+/5 plantar flexion Skin: warm and dry Neurologic: Ambulates with difficulty; Sensation intact about the lower extremities  Psychological: alert and cooperative; normal mood and affect  DIAGNOSTIC STUDIES:  Dg Ankle Complete Left  Result Date: 02/13/2019 CLINICAL DATA:  Twisting injury left ankle last night when the patient fell on a porch. Initial encounter. EXAM: LEFT ANKLE COMPLETE - 3+ VIEW COMPARISON:  None. FINDINGS: There is no evidence of fracture, dislocation, or joint effusion. There is no evidence of arthropathy or other focal bone abnormality. Soft tissues are unremarkable. IMPRESSION: Negative exam. Electronically Signed   By: Drusilla Kannerhomas  Dalessio M.D.   On: 02/13/2019 14:05     ASSESSMENT & PLAN:  1. Sprain of anterior talofibular ligament of left ankle, initial encounter     Meds ordered this encounter  Medications  . naproxen (NAPROSYN) 500 MG tablet    Sig: Take 1 tablet (500 mg total) by mouth 2 (two) times daily.    Dispense:  30 tablet    Refill:  0    Order Specific Question:   Supervising Provider    Answer:   Eustace MooreELSON, YVONNE SUE [[id number]]   X-rays did not show fracture or dislocation Injury most likely musculoskeletal in nature Cam walker placed.  Wear as needed for comfort Continue conservative management of rest, ice, elevation Avoid painful activities and progress activities as tolerated (full weight-bearing, walking, running, cutting, jumping, etc...) Take naproxen as needed for pain relief (may cause abdominal discomfort, ulcers, and GI bleeds avoid taking with other NSAIDs) Follow up with orthopedist if symptoms persist Return or go to the ER if you have any new or worsening symptoms (fever, chills, worsening pain, increased swelling, redness, inability, etc...)    Reviewed expectations re: course of current medical issues. Questions answered. Outlined signs and symptoms indicating need for more acute intervention. Patient verbalized understanding. After Visit Summary given.    Rennis HardingWurst, Mackenzy Grumbine, PA-C 02/13/19 1432

## 2019-02-13 NOTE — Discharge Instructions (Signed)
X-rays did not show fracture or dislocation Injury most likely musculoskeletal in nature Cam walker placed.  Wear as needed for comfort Continue conservative management of rest, ice, elevation Avoid painful activities and progress activities as tolerated (full weight-bearing, walking, running, cutting, jumping, etc...) Take naproxen as needed for pain relief (may cause abdominal discomfort, ulcers, and GI bleeds avoid taking with other NSAIDs) Follow up with orthopedist if symptoms persist Return or go to the ER if you have any new or worsening symptoms (fever, chills, worsening pain, increased swelling, redness, inability, etc...)

## 2019-02-13 NOTE — ED Notes (Signed)
Patient verbalizes understanding of discharge instructions. Opportunity for questioning and answers were provided. Patient discharged from [facility] by RN.  

## 2019-05-11 ENCOUNTER — Emergency Department (HOSPITAL_COMMUNITY): Payer: Self-pay

## 2019-05-11 ENCOUNTER — Other Ambulatory Visit: Payer: Self-pay

## 2019-05-11 ENCOUNTER — Emergency Department (HOSPITAL_COMMUNITY)
Admission: EM | Admit: 2019-05-11 | Discharge: 2019-05-12 | Disposition: A | Discharge status: Home / Self Care | Payer: Self-pay | Attending: Emergency Medicine | Admitting: Emergency Medicine

## 2019-05-11 DIAGNOSIS — M545 Low back pain, unspecified: Secondary | ICD-10-CM

## 2019-05-11 DIAGNOSIS — Z79899 Other long term (current) drug therapy: Secondary | ICD-10-CM | POA: Insufficient documentation

## 2019-05-11 DIAGNOSIS — M25572 Pain in left ankle and joints of left foot: Secondary | ICD-10-CM | POA: Insufficient documentation

## 2019-05-11 LAB — I-STAT BETA HCG BLOOD, ED (MC, WL, AP ONLY): I-stat hCG, quantitative: <5 m[IU]/mL (ref <5)

## 2019-05-11 MED ORDER — METHOCARBAMOL 500 MG PO TABS
500.0000 mg | ORAL_TABLET | Freq: Once | ORAL | Status: AC
  Administered 2019-05-11: 500 mg via ORAL
  Filled 2019-05-11: qty 1

## 2019-05-11 NOTE — ED Triage Notes (Signed)
1 month ago pt fractured left ankle ans now is having spasm in her thigh and leg. Still painful to walk on.

## 2019-05-11 NOTE — ED Notes (Signed)
Patient transported to X-ray 

## 2019-05-11 NOTE — ED Provider Notes (Signed)
[facility] Provider Note   CSN: [id number] Arrival date & time: 05/11/19  2054    History   Chief Complaint Chief Complaint  Patient presents with  . Ankle Pain    HPI Tina GriffithsOlina S Caster is a 28 y.o. female.     The history is provided by the patient and medical records. No language interpreter was used.  Ankle Pain Associated symptoms: no fever    Tina GriffithsOlina S Cinque is a 28 y.o. female presents to the emergency department complaining of left ankle pain.  She states that she sprained her ankle a couple of months ago.  It has continued to hurt and she has not felt like it ever truly healed.  She states that she was given a walking boot, but quit using it after a couple of days because it was more uncomfortable than just walking without the boot.  She was also prescribed naproxen, but states that she never took that either.  Over the last couple of weeks, she has been having some central and left-sided back pain as well.  If she walks for long periods of time, she will develop some muscle spasms, but charley horses in her calf and some spasms in her left thigh as well.  Patient denies upper back or neck pain. No fever, saddle anesthesia, weakness, numbness, urinary complaints including retention/incontinence. No history of cancer, IVDU, or recent spinal procedures.   Past Medical History:  Diagnosis Date  . Bronchitis   . Chlamydia   . Migraines 2012  . Trichomonas     Patient Active Problem List   Diagnosis Date Noted  . Supervision of low-risk first pregnancy 12/06/2013  . Chlamydia 09/18/2013  . History of trichomonal vaginitis 09/18/2013  . History of gonorrhea 09/18/2013  . HSV-2 (herpes simplex virus 2) infection 09/18/2013    Past Surgical History:  Procedure Laterality Date  . NO PAST SURGERIES       OB History    Gravida  1   Para      Term      Preterm      AB  1   Living        SAB  1   TAB      Ectopic      Multiple      Live Births               Home Medications    Prior to Admission medications   Medication Sig Start Date End Date Taking? Authorizing Provider  acetaminophen (TYLENOL) 500 MG tablet Take 1,000 mg by mouth 2 (two) times daily as needed for pain.     [provider]  albuterol (PROVENTIL HFA;VENTOLIN HFA) 108 (90 Base) MCG/ACT inhaler Inhale 1-2 puffs into the lungs every 6 (six) hours as needed for wheezing or shortness of breath. 12/04/18   Cathie HoopsYu, Amy V, PA-C  diphenhydrAMINE (BENADRYL) 25 MG tablet Take 25 mg by mouth every 6 (six) hours as needed for allergies.    [provider]  methocarbamol (ROBAXIN) 500 MG tablet Take 1 tablet (500 mg total) by mouth 2 (two) times daily as needed for muscle spasms. 05/12/19   Jacqui Headen, [name] PicketJaime Pilcher, PA-C  methylPREDNISolone (MEDROL DOSEPAK) 4 MG TBPK tablet Take as directed on package 05/12/19   Mally Gavina, [name] PicketJaime Pilcher, PA-C  naproxen (NAPROSYN) 500 MG tablet Take 1 tablet (500 mg total) by mouth 2 (two) times daily. 02/13/19   Rennis HardingWurst, Brittany, PA-C    Family  History Family History  Problem Relation Age of Onset  . Asthma Mother   . Arthritis Mother   . Diabetes Mother   . Heart disease Mother   . Hypertension Mother   . Stroke Mother   . Kidney disease Mother   . Vision loss Mother   . Diabetes Maternal Grandmother   . Hypertension Maternal Grandmother     Social History Social History   Tobacco Use  . Smoking status: Never Smoker  . Smokeless tobacco: Never Used  Substance Use Topics  . Alcohol use: No    Comment: socially  . Drug use: No     Allergies   Latex   Review of Systems Review of Systems  Constitutional: Negative for fever.  Gastrointestinal: Negative for abdominal pain, nausea and vomiting.  Musculoskeletal: Positive for arthralgias and myalgias.  Skin: Negative for color change and wound.  Neurological: Negative for weakness and numbness.     Physical Exam Updated Vital Signs BP  106/74 (BP Location: Right Arm)   Pulse 78   Temp 98.3 F (36.8 C) (Oral)   Resp 16   SpO2 100%   Physical Exam Vitals signs and nursing note reviewed.  Constitutional:      General: She is not in acute distress.    Appearance: She is well-developed.  HENT:     Head: Normocephalic and atraumatic.  Neck:     Musculoskeletal: Neck supple.  Cardiovascular:     Rate and Rhythm: Normal rate and regular rhythm.     Heart sounds: Normal heart sounds. No murmur.  Pulmonary:     Effort: Pulmonary effort is normal. No respiratory distress.     Breath sounds: Normal breath sounds. No wheezing or rales.  Abdominal:     General: There is no distension.     Palpations: Abdomen is soft.     Tenderness: There is no abdominal tenderness.  Musculoskeletal: Normal range of motion.       Back:     Comments: Tenderness to the back as depicted in image.  Tenderness to palpation of the lateral malleolus of the left lower extremity.  5/5 muscle strength to bilateral lower extremities with full range of motion as well.  All compartments are soft.  2+ DP and sensation intact bilaterally.  Skin:    General: Skin is warm and dry.  Neurological:     Mental Status: She is alert.      ED Treatments / Results  Labs (all labs ordered are listed, but only abnormal results are displayed) Labs Reviewed  I-STAT BETA HCG BLOOD, ED ([facility], [facility], [facility] ONLY)    EKG None  Radiology Dg Lumbar Spine Complete  Result Date: 05/11/2019 CLINICAL DATA:  Lumbosacral back pain. EXAM: LUMBAR SPINE - COMPLETE 4+ VIEW COMPARISON:  None. FINDINGS: Small right rib at L1. The alignment is maintained. Vertebral body heights are normal. There is no listhesis. The posterior elements are intact. Disc spaces are preserved. No fracture. Sacroiliac joints are symmetric and normal. IMPRESSION: Negative radiographs of the lumbar spine. Electronically Signed   By: Keith Rake M.D.   On: 05/11/2019 23:18   Dg Ankle Complete Left   Result Date: 05/11/2019 CLINICAL DATA:  Left ankle pain. Patient reports ankle fracture 1 month ago. EXAM: LEFT ANKLE COMPLETE - 3+ VIEW COMPARISON:  Radiograph 02/13/2019 FINDINGS: There is no evidence of fracture, dislocation, or joint effusion. Talar dome is intact. No periosteal reaction. There is no evidence of arthropathy or other focal bone abnormality. Soft  tissues are unremarkable. IMPRESSION: Negative radiographs of the left ankle. Electronically Signed   By: Narda RutherfordMelanie  Sanford M.D.   On: 05/11/2019 23:16    Procedures Procedures (including critical care time)  Medications Ordered in ED Medications  methocarbamol (ROBAXIN) tablet 500 mg (500 mg Oral Given 05/11/19 2205)     Initial Impression / Assessment and Plan / ED Course  I have reviewed the triage vital signs and the nursing notes.  Pertinent labs & imaging results that were available during my care of the patient were reviewed by me and considered in my medical decision making (see chart for details).       Tina GriffithsOlina S Sibilia is a 28 y.o. female who presents to ED for persistent left ankle pain for 2 to 3 months after twisting her ankle as well as left-sided low back pain which began a few days ago.  No red flag symptoms of back pain.  Bilateral lower extremities are neurovascularly intact.  Very minimal midline tenderness.  X-rays obtained which were unremarkable.  Will treat symptomatically and have her follow-up with orthopedist.  Reasons to return to the emergency department were discussed and all questions were answered.    Final Clinical Impressions(s) / ED Diagnoses   Final diagnoses:  Left ankle pain, unspecified chronicity  Acute left-sided low back pain without sciatica    ED Discharge Orders         Ordered    methocarbamol (ROBAXIN) 500 MG tablet  2 times daily PRN     05/12/19 0004    methylPREDNISolone (MEDROL DOSEPAK) 4 MG TBPK tablet     05/12/19 0004           Brynnlee Cumpian Boissonneault, [name] PicketJaime Pilcher, PA-C 05/12/19  0006    Blane OharaZavitz, Joshua, MD 05/12/19 0015

## 2019-05-12 MED ORDER — METHYLPREDNISOLONE 4 MG PO TBPK: ORAL_TABLET | ORAL | 0 refills | Status: DC

## 2019-05-12 MED ORDER — METHOCARBAMOL 500 MG PO TABS: 500.0000 mg | ORAL_TABLET | Freq: Two times a day (BID) | ORAL | 0 refills | Status: DC | PRN

## 2019-05-12 NOTE — Discharge Instructions (Signed)
It was my pleasure taking care of you today!   Robaxin as needed for muscle spasms.  Take steroid dosepack as directed.   Call the orthopedic doctor listed to schedule a follow up appointment if you are not feeling better in a few days.   Return to the emergency department for new or worsening symptoms, any additional concerns.

## 2019-11-12 DIAGNOSIS — Z113 Encounter for screening for infections with a predominantly sexual mode of transmission: Secondary | ICD-10-CM | POA: Diagnosis not present

## 2019-11-12 DIAGNOSIS — A5901 Trichomonal vulvovaginitis: Secondary | ICD-10-CM | POA: Diagnosis not present

## 2019-11-12 DIAGNOSIS — Z1159 Encounter for screening for other viral diseases: Secondary | ICD-10-CM | POA: Diagnosis not present

## 2019-11-12 DIAGNOSIS — Z3009 Encounter for other general counseling and advice on contraception: Secondary | ICD-10-CM | POA: Diagnosis not present

## 2019-11-12 DIAGNOSIS — Z118 Encounter for screening for other infectious and parasitic diseases: Secondary | ICD-10-CM | POA: Diagnosis not present

## 2019-11-12 DIAGNOSIS — Z114 Encounter for screening for human immunodeficiency virus [HIV]: Secondary | ICD-10-CM | POA: Diagnosis not present

## 2019-11-12 DIAGNOSIS — N760 Acute vaginitis: Secondary | ICD-10-CM | POA: Diagnosis not present

## 2019-11-21 DIAGNOSIS — Z131 Encounter for screening for diabetes mellitus: Secondary | ICD-10-CM | POA: Diagnosis not present

## 2019-11-21 DIAGNOSIS — Z6837 Body mass index (BMI) 37.0-37.9, adult: Secondary | ICD-10-CM | POA: Diagnosis not present

## 2019-11-21 DIAGNOSIS — Z1329 Encounter for screening for other suspected endocrine disorder: Secondary | ICD-10-CM | POA: Diagnosis not present

## 2019-11-21 DIAGNOSIS — Z130 Encounter for screening for diseases of the blood and blood-forming organs and certain disorders involving the immune mechanism: Secondary | ICD-10-CM | POA: Diagnosis not present

## 2019-11-21 DIAGNOSIS — Z3009 Encounter for other general counseling and advice on contraception: Secondary | ICD-10-CM | POA: Diagnosis not present

## 2019-11-21 DIAGNOSIS — Z01419 Encounter for gynecological examination (general) (routine) without abnormal findings: Secondary | ICD-10-CM | POA: Diagnosis not present

## 2019-11-21 DIAGNOSIS — Z13220 Encounter for screening for lipoid disorders: Secondary | ICD-10-CM | POA: Diagnosis not present

## 2019-11-21 DIAGNOSIS — Z0000 Encounter for general adult medical examination without abnormal findings: Secondary | ICD-10-CM | POA: Diagnosis not present

## 2019-11-30 DIAGNOSIS — Z20828 Contact with and (suspected) exposure to other viral communicable diseases: Secondary | ICD-10-CM | POA: Diagnosis not present

## 2019-12-10 ENCOUNTER — Ambulatory Visit (INDEPENDENT_AMBULATORY_CARE_PROVIDER_SITE_OTHER): Payer: Self-pay | Admitting: Obstetrics and Gynecology

## 2019-12-10 ENCOUNTER — Encounter: Payer: Self-pay | Admitting: Obstetrics and Gynecology

## 2019-12-10 ENCOUNTER — Other Ambulatory Visit: Payer: Self-pay

## 2019-12-10 VITALS — BP 110/78 | HR 77 | Ht 60.0 in | Wt 190.0 lb

## 2019-12-10 DIAGNOSIS — N979 Female infertility, unspecified: Secondary | ICD-10-CM

## 2019-12-10 DIAGNOSIS — Z20828 Contact with and (suspected) exposure to other viral communicable diseases: Secondary | ICD-10-CM | POA: Diagnosis not present

## 2019-12-10 NOTE — Progress Notes (Signed)
HPI:      [name] is a 29 y.o. G1P0010 who LMP was Patient's last menstrual period was 11/29/2019.  Subjective:   She presents today stating that she has been attempting pregnancy on and off for 3 years.  She has not been able to conceive in that time.  She says her periods are normal monthly menses.  She and her partner were on again off again during some of that time and she use condoms for part of that time but mostly had unprotected sex. Patient states that she has had chlamydia twice in her life most recently 1 month ago.  She says she was treated both times as was her partner. She has never had abdominal surgery. She states that she became pregnant more than 3 years ago and had a miscarriage. She states that she has occasionally used ovulation predictor kits in the past and it appears as if she ovulates. She states that her partner has fathered children before and has a 36-year-old.    Hx: The following portions of the patient's history were reviewed and updated as appropriate:             She  has a past medical history of Bronchitis, Chlamydia, Migraines (2012), and Trichomonas. She does not have any pertinent problems on file. She  has a past surgical history that includes No past surgeries. Her family history includes Arthritis in her mother; Asthma in her mother; Diabetes in her maternal grandmother and mother; Heart disease in her mother; Hypertension in her maternal grandmother and mother; Kidney disease in her mother; Stroke in her mother; Vision loss in her mother. She  reports that she has never smoked. She has never used smokeless tobacco. She reports that she does not drink alcohol or use drugs. She has a current medication list which includes the following prescription(s): acetaminophen, albuterol, diphenhydramine, methocarbamol, and naproxen. She is allergic to latex.       Review of Systems:  Review of Systems  Constitutional: Denied constitutional symptoms,  night sweats, recent illness, fatigue, fever, insomnia and weight loss.  Eyes: Denied eye symptoms, eye pain, photophobia, vision change and visual disturbance.  Ears/Nose/Throat/Neck: Denied ear, nose, throat or neck symptoms, hearing loss, nasal discharge, sinus congestion and sore throat.  Cardiovascular: Denied cardiovascular symptoms, arrhythmia, chest pain/pressure, edema, exercise intolerance, orthopnea and palpitations.  Respiratory: Denied pulmonary symptoms, asthma, pleuritic pain, productive sputum, cough, dyspnea and wheezing.  Gastrointestinal: Denied, gastro-esophageal reflux, melena, nausea and vomiting.  Genitourinary: Denied genitourinary symptoms including symptomatic vaginal discharge, pelvic relaxation issues, and urinary complaints.  Musculoskeletal: Denied musculoskeletal symptoms, stiffness, swelling, muscle weakness and myalgia.  Dermatologic: Denied dermatology symptoms, rash and scar.  Neurologic: Denied neurology symptoms, dizziness, headache, neck pain and syncope.  Psychiatric: Denied psychiatric symptoms, anxiety and depression.  Endocrine: Denied endocrine symptoms including hot flashes and night sweats.   Meds:   Current Outpatient Medications on File Prior to Visit  Medication Sig Dispense Refill  . acetaminophen (TYLENOL) 500 MG tablet Take 1,000 mg by mouth 2 (two) times daily as needed for pain.     Marland Kitchen albuterol (PROVENTIL HFA;VENTOLIN HFA) 108 (90 Base) MCG/ACT inhaler Inhale 1-2 puffs into the lungs every 6 (six) hours as needed for wheezing or shortness of breath. (Patient not taking: Reported on 12/10/2019) 1 Inhaler 0  . diphenhydrAMINE (BENADRYL) 25 MG tablet Take 25 mg by mouth every 6 (six) hours as needed for allergies.    . methocarbamol (ROBAXIN) 500 MG tablet  Take 1 tablet (500 mg total) by mouth 2 (two) times daily as needed for muscle spasms. (Patient not taking: Reported on 12/10/2019) 20 tablet 0  . naproxen (NAPROSYN) 500 MG tablet Take 1 tablet  (500 mg total) by mouth 2 (two) times daily. (Patient not taking: Reported on 12/10/2019) 30 tablet 0   No current facility-administered medications on file prior to visit.    Objective:     Vitals:   12/10/19 1109  BP: 110/78  Pulse: 77                Assessment:    G1P0010 Patient Active Problem List   Diagnosis Date Noted  . Supervision of low-risk first pregnancy 12/06/2013  . Chlamydia 09/18/2013  . History of trichomonal vaginitis 09/18/2013  . History of gonorrhea 09/18/2013  . HSV-2 (herpes simplex virus 2) infection 09/18/2013     1. Infertility, female     It seems likely that she has normal hormones because she has normal regular monthly menses.  Likely not her partner because he has recently fathered a child. It is possible she has an issue with her fallopian tubes because of the chlamydia history but this is also lower on my list.  My initial impression is that this may simply be a timing of intercourse issue.   Plan:            1.  PCO labs  2.  Ovulation predictor kits done well for 3 months in a row with appropriate intercourse.  3.  If no results in pregnancy consider HSG.  4.  If HSG normal-recommend semen analysis  5.  GC/CT today for test of cure. Orders Orders Placed This Encounter  Procedures  . DHEA-sulfate  . FSH/LH  . Glucose, fasting  . Insulin, random  . Prolactin  . Testosterone, Free, Total, SHBG    No orders of the defined types were placed in this encounter.     F/U  Return in about 3 months (around 03/08/2020). I spent 34 minutes involved in the care of this patient preparing to see the patient by obtaining and reviewing her medical history (including labs, imaging tests and prior procedures), documenting clinical information in the electronic health record (EHR), counseling and coordinating care plans, writing and sending prescriptions, ordering tests or procedures and directly communicating with the patient by discussing  pertinent items from her history and physical exam as well as detailing my assessment and plan as noted above so that she has an informed understanding.  All of her questions were answered.  Finis Bud, M.D. 12/10/2019 11:54 AM

## 2019-12-10 NOTE — Addendum Note (Signed)
Addended by: Dorian Pod on: 12/10/2019 12:02 PM   Modules accepted: Orders

## 2019-12-11 LAB — GC/CHLAMYDIA PROBE AMP
Chlamydia trachomatis, NAA: NEGATIVE
Neisseria Gonorrhoeae by PCR: NEGATIVE

## 2019-12-17 ENCOUNTER — Other Ambulatory Visit: Payer: Self-pay

## 2019-12-24 ENCOUNTER — Other Ambulatory Visit: Payer: Self-pay

## 2019-12-24 ENCOUNTER — Other Ambulatory Visit: Payer: BC Managed Care – PPO

## 2019-12-25 ENCOUNTER — Other Ambulatory Visit: Payer: BC Managed Care – PPO

## 2019-12-25 DIAGNOSIS — N979 Female infertility, unspecified: Secondary | ICD-10-CM | POA: Diagnosis not present

## 2019-12-26 ENCOUNTER — Telehealth: Payer: Self-pay | Admitting: Obstetrics and Gynecology

## 2019-12-26 NOTE — Telephone Encounter (Signed)
Notified patient that labs have not come back yet.

## 2019-12-26 NOTE — Telephone Encounter (Signed)
Patient called to follow up with her lab results.  -TC

## 2019-12-26 NOTE — Telephone Encounter (Signed)
Spoke with patient and she is going to pick up labs when other lab results come back.

## 2019-12-26 NOTE — Telephone Encounter (Signed)
Pt called requesting for her STD lab results. Pt stated she would come pick a hard copy if necessary. Advised her I'd reach out to you and for her to wait for your phone call before coming.

## 2019-12-28 LAB — TESTOSTERONE, FREE, TOTAL, SHBG
Sex Hormone Binding: 36.4 nmol/L (ref 24.6–122.0)
Testosterone, Free: 4.6 pg/mL — ABNORMAL HIGH (ref 0.0–4.2)
Testosterone: 71 ng/dL — ABNORMAL HIGH (ref 8–48)

## 2019-12-28 LAB — FSH/LH
FSH: 2.9 m[IU]/mL
LH: 9.4 m[IU]/mL

## 2019-12-28 LAB — DHEA-SULFATE: DHEA-SO4: 281 ug/dL (ref 84.8–378.0)

## 2019-12-28 LAB — GLUCOSE, FASTING: Glucose, Plasma: 77 mg/dL (ref 65–99)

## 2019-12-28 LAB — INSULIN, RANDOM: INSULIN: 9.1 u[IU]/mL (ref 2.6–24.9)

## 2019-12-28 LAB — PROLACTIN: Prolactin: 28.7 ng/mL — ABNORMAL HIGH (ref 4.8–23.3)

## 2019-12-31 ENCOUNTER — Telehealth: Payer: Self-pay | Admitting: Obstetrics and Gynecology

## 2019-12-31 NOTE — Telephone Encounter (Signed)
Patient called wanting to follow up with her lab results could you please advise patient.

## 2019-12-31 NOTE — Telephone Encounter (Signed)
Scheduled patient to come in to discuss treatment.

## 2020-01-01 ENCOUNTER — Other Ambulatory Visit: Payer: Self-pay

## 2020-01-01 ENCOUNTER — Telehealth: Payer: Self-pay | Admitting: Obstetrics and Gynecology

## 2020-01-01 ENCOUNTER — Encounter: Payer: Self-pay | Admitting: Obstetrics and Gynecology

## 2020-01-01 ENCOUNTER — Ambulatory Visit (INDEPENDENT_AMBULATORY_CARE_PROVIDER_SITE_OTHER): Payer: BC Managed Care – PPO | Admitting: Obstetrics and Gynecology

## 2020-01-01 VITALS — BP 119/83 | HR 85 | Ht 60.0 in | Wt 189.3 lb

## 2020-01-01 DIAGNOSIS — N979 Female infertility, unspecified: Secondary | ICD-10-CM

## 2020-01-01 DIAGNOSIS — E221 Hyperprolactinemia: Secondary | ICD-10-CM

## 2020-01-01 DIAGNOSIS — E281 Androgen excess: Secondary | ICD-10-CM

## 2020-01-01 MED ORDER — CABERGOLINE 0.5 MG PO TABS: 0.2500 mg | ORAL_TABLET | ORAL | 2 refills | Status: AC

## 2020-01-01 NOTE — Progress Notes (Signed)
HPI:      [name] is a 29 y.o. G1P0010 who LMP was Patient's last menstrual period was 12/31/2019.  Subjective:   She presents today to discuss her infertility.  She states that she has normal regular monthly menses but has not had her ovulation predictor kits show ovulation" more than 2 years."   Her recent PCO labs reveal elevated testosterone and prolactin.    Hx: The following portions of the patient's history were reviewed and updated as appropriate:             She  has a past medical history of Bronchitis, Chlamydia, Migraines (2012), and Trichomonas. She does not have any pertinent problems on file. She  has a past surgical history that includes No past surgeries. Her family history includes Arthritis in her mother; Asthma in her mother; Diabetes in her maternal grandmother and mother; Heart disease in her mother; Hypertension in her maternal grandmother and mother; Kidney disease in her mother; Stroke in her mother; Vision loss in her mother. She  reports that she has never smoked. She has never used smokeless tobacco. She reports that she does not drink alcohol or use drugs. She has a current medication list which includes the following prescription(s): acetaminophen, albuterol, [START ON 01/02/2020] cabergoline, diphenhydramine, methocarbamol, and naproxen. She is allergic to latex.       Review of Systems:  Review of Systems  Constitutional: Denied constitutional symptoms, night sweats, recent illness, fatigue, fever, insomnia and weight loss.  Eyes: Denied eye symptoms, eye pain, photophobia, vision change and visual disturbance.  Ears/Nose/Throat/Neck: Denied ear, nose, throat or neck symptoms, hearing loss, nasal discharge, sinus congestion and sore throat.  Cardiovascular: Denied cardiovascular symptoms, arrhythmia, chest pain/pressure, edema, exercise intolerance, orthopnea and palpitations.  Respiratory: Denied pulmonary symptoms, asthma, pleuritic pain, productive  sputum, cough, dyspnea and wheezing.  Gastrointestinal: Denied, gastro-esophageal reflux, melena, nausea and vomiting.  Genitourinary: Denied genitourinary symptoms including symptomatic vaginal discharge, pelvic relaxation issues, and urinary complaints.  Musculoskeletal: Denied musculoskeletal symptoms, stiffness, swelling, muscle weakness and myalgia.  Dermatologic: Denied dermatology symptoms, rash and scar.  Neurologic: Denied neurology symptoms, dizziness, headache, neck pain and syncope.  Psychiatric: Denied psychiatric symptoms, anxiety and depression.  Endocrine: See HPI for additional information.   Meds:   Current Outpatient Medications on File Prior to Visit  Medication Sig Dispense Refill  . acetaminophen (TYLENOL) 500 MG tablet Take 1,000 mg by mouth 2 (two) times daily as needed for pain.     Marland Kitchen albuterol (PROVENTIL HFA;VENTOLIN HFA) 108 (90 Base) MCG/ACT inhaler Inhale 1-2 puffs into the lungs every 6 (six) hours as needed for wheezing or shortness of breath. (Patient not taking: Reported on 12/10/2019) 1 Inhaler 0  . diphenhydrAMINE (BENADRYL) 25 MG tablet Take 25 mg by mouth every 6 (six) hours as needed for allergies.    . methocarbamol (ROBAXIN) 500 MG tablet Take 1 tablet (500 mg total) by mouth 2 (two) times daily as needed for muscle spasms. (Patient not taking: Reported on 12/10/2019) 20 tablet 0  . naproxen (NAPROSYN) 500 MG tablet Take 1 tablet (500 mg total) by mouth 2 (two) times daily. (Patient not taking: Reported on 12/10/2019) 30 tablet 0   No current facility-administered medications on file prior to visit.    Objective:     Vitals:   01/01/20 1457  BP: 119/83  Pulse: 85                Assessment:    G1P0010 Patient  Active Problem List   Diagnosis Date Noted  . Supervision of low-risk first pregnancy 12/06/2013  . Chlamydia 09/18/2013  . History of trichomonal vaginitis 09/18/2013  . History of gonorrhea 09/18/2013  . HSV-2 (herpes simplex virus  2) infection 09/18/2013     1. Infertility, female   2. Hyperprolactinemia (HCC)   3. Ovarian androgen excess     Patient now with 2 known causes of infertility which include elevated prolactin and elevated testosterone.   Plan:            1.  We will correct the prolactin first using Dostinex and retest for prolactin levels and retest testosterone as well at that time.  Patient will also begin to use ovulation predictor kits to see if she is ovulating.  2.  In 72-month follow-up if her testosterone remains elevated and she is not ovulatory would consider a 75-month trial of OCPs to correct testosterone excess while taking Dostinex for prolactin excess.  After that trial for ovulation while attempting pregnancy.  3.  Because the patient has a remote history of positive chlamydia but also perform HSG once patient is ovulatory if she fails to become pregnant.  I discussed all this in detail with her and the rationale for each step as well as her abnormal labs were addressed.  All questions answered. Orders Orders Placed This Encounter  Procedures  . Prolactin  . Testosterone, Free, Total, SHBG     Meds ordered this encounter  Medications  . cabergoline (DOSTINEX) 0.5 MG tablet    Sig: Take 0.5 tablets (0.25 mg total) by mouth 2 (two) times a week.    Dispense:  10 tablet    Refill:  2      F/U  Return in about 8 weeks (around 02/26/2020). I spent 24 minutes involved in the care of this patient preparing to see the patient by obtaining and reviewing her medical history (including labs, imaging tests and prior procedures), documenting clinical information in the electronic health record (EHR), counseling and coordinating care plans, writing and sending prescriptions, ordering tests or procedures and directly communicating with the patient by discussing pertinent items from her history and physical exam as well as detailing my assessment and plan as noted above so that she has an informed  understanding.  All of her questions were answered.  Elonda Husky, M.D. 01/01/2020 3:22 PM

## 2020-01-01 NOTE — Telephone Encounter (Signed)
Pt was checking out and wanted [place] to print out her labs. Can you print it out for her to pick up

## 2020-01-02 NOTE — Telephone Encounter (Signed)
LM to return call.

## 2020-01-23 NOTE — Telephone Encounter (Signed)
error 

## 2020-02-04 ENCOUNTER — Other Ambulatory Visit: Payer: Self-pay

## 2020-02-04 ENCOUNTER — Other Ambulatory Visit: Payer: BC Managed Care – PPO

## 2020-02-04 DIAGNOSIS — E281 Androgen excess: Secondary | ICD-10-CM

## 2020-02-04 DIAGNOSIS — E221 Hyperprolactinemia: Secondary | ICD-10-CM

## 2020-02-06 LAB — TESTOSTERONE, FREE, TOTAL, SHBG
Sex Hormone Binding: 46.5 nmol/L (ref 24.6–122.0)
Testosterone, Free: 2.2 pg/mL (ref 0.0–4.2)
Testosterone: 67 ng/dL — ABNORMAL HIGH (ref 8–48)

## 2020-02-06 LAB — PROLACTIN: Prolactin: 1.2 ng/mL — ABNORMAL LOW (ref 4.8–23.3)

## 2020-02-10 ENCOUNTER — Telehealth: Payer: Self-pay | Admitting: Obstetrics and Gynecology

## 2020-02-10 NOTE — Telephone Encounter (Signed)
LM for patient to return call.

## 2020-02-10 NOTE — Telephone Encounter (Signed)
Pt was returning the nurses call. Please advise

## 2020-02-10 NOTE — Telephone Encounter (Signed)
Patient called in saying that she is pregnant and is having a little bit of pain but went to urgent care  then [facility] and they  stated there was no abnormalities and that everything seemed fine. Patient has been receiving infertility treatment and is worried that this would cause complications or problems with her pregnancy.

## 2020-02-11 NOTE — Telephone Encounter (Signed)
Pt was returning your call. Please advise  

## 2020-02-13 MED ORDER — DOXYLAMINE-PYRIDOXINE 10-10 MG PO TBEC: 2.0000 | DELAYED_RELEASE_TABLET | Freq: Every day | ORAL | 5 refills | Status: DC

## 2020-02-13 NOTE — Telephone Encounter (Signed)
Pt is requesting a callback from the nurse. Please advise

## 2020-02-13 NOTE — Telephone Encounter (Signed)
Spoke with patient and she was seen at hospital over the weekend. She found out she was pregnant. She is about she was 5 weeks 4 days from US done on 02/08/2020. I sent in Diclegis to the pharmacy for her nausea. I have scheduled her for her nurse intake.

## 2020-02-19 ENCOUNTER — Telehealth: Payer: Self-pay | Admitting: Obstetrics and Gynecology

## 2020-02-19 NOTE — Telephone Encounter (Signed)
Pt called in and stated she is very nausea the pt stated that she has taking something for it and it isnt work. The pt is requesting a call. Please advise

## 2020-02-21 NOTE — Telephone Encounter (Signed)
Spoke with patient and gave her some thing to try OTC for her nausea. If this does not help she will call back to let me know.

## 2020-03-02 ENCOUNTER — Ambulatory Visit (INDEPENDENT_AMBULATORY_CARE_PROVIDER_SITE_OTHER): Payer: BC Managed Care – PPO

## 2020-03-02 ENCOUNTER — Other Ambulatory Visit: Payer: Self-pay

## 2020-03-02 VITALS — BP 102/68 | HR 77 | Ht 60.0 in | Wt 192.1 lb

## 2020-03-02 DIAGNOSIS — Z0283 Encounter for blood-alcohol and blood-drug test: Secondary | ICD-10-CM | POA: Diagnosis not present

## 2020-03-02 DIAGNOSIS — R638 Other symptoms and signs concerning food and fluid intake: Secondary | ICD-10-CM | POA: Diagnosis not present

## 2020-03-02 DIAGNOSIS — Z113 Encounter for screening for infections with a predominantly sexual mode of transmission: Secondary | ICD-10-CM

## 2020-03-02 DIAGNOSIS — Z3491 Encounter for supervision of normal pregnancy, unspecified, first trimester: Secondary | ICD-10-CM | POA: Diagnosis not present

## 2020-03-02 DIAGNOSIS — Z3A08 8 weeks gestation of pregnancy: Secondary | ICD-10-CM

## 2020-03-02 NOTE — Patient Instructions (Signed)
WHAT OB PATIENTS CAN EXPECT   Confirmation of pregnancy and ultrasound ordered if medically indicated-[redacted] weeks gestation  New OB (NOB) intake with nurse and New OB (NOB) labs- [redacted] weeks gestation  New OB (NOB) physical examination with provider- 11/[redacted] weeks gestation  Flu vaccine-[redacted] weeks gestation  Anatomy scan-[redacted] weeks gestation  Glucose tolerance test, blood work to test for anemia, T-dap vaccine-[redacted] weeks gestation  Vaginal swabs/cultures-STD/Group B strep-[redacted] weeks gestation  Appointments every 4 weeks until 28 weeks  Every 2 weeks from 28 weeks until 36 weeks  Weekly visits from 36 weeks until delivery  Morning Sickness  Morning sickness is when you feel sick to your stomach (nauseous) during pregnancy. You may feel sick to your stomach and throw up (vomit). You may feel sick in the morning, but you can feel this way at any time of day. Some women feel very sick to their stomach and cannot stop throwing up (hyperemesis gravidarum). Follow these instructions at home: Medicines  Take over-the-counter and prescription medicines only as told by your doctor. Do not take any medicines until you talk with your doctor about them first.  Taking multivitamins before getting pregnant can stop or lessen the harshness of morning sickness. Eating and drinking  Eat dry toast or crackers before getting out of bed.  Eat 5 or 6 small meals a day.  Eat dry and bland foods like rice and baked potatoes.  Do not eat greasy, fatty, or spicy foods.  Have someone cook for you if the smell of food causes you to feel sick or throw up.  If you feel sick to your stomach after taking prenatal vitamins, take them at night or with a snack.  Eat protein when you need a snack. Nuts, yogurt, and cheese are good choices.  Drink fluids throughout the day.  Try ginger ale made with real ginger, ginger tea made from fresh grated ginger, or ginger candies. General instructions  Do not use any products  that have nicotine or tobacco in them, such as cigarettes and e-cigarettes. If you need help quitting, ask your doctor.  Use an air purifier to keep the air in your house free of smells.  Get lots of fresh air.  Try to avoid smells that make you feel sick.  Try: ? Wearing a bracelet that is used for seasickness (acupressure wristband). ? Going to a doctor who puts thin needles into certain body points (acupuncture) to improve how you feel. Contact a doctor if:  You need medicine to feel better.  You feel dizzy or light-headed.  You are losing weight. Get help right away if:  You feel very sick to your stomach and cannot stop throwing up.  You pass out (faint).  You have very bad pain in your belly. Summary  Morning sickness is when you feel sick to your stomach (nauseous) during pregnancy.  You may feel sick in the morning, but you can feel this way at any time of day.  Making some changes to what you eat may help your symptoms go away. This information is not intended to replace advice given to you by your health care provider. Make sure you discuss any questions you have with your health care provider. Document Revised: 09/22/2017 Document Reviewed: 11/10/2016 Elsevier Patient Education  2020 [employer]. How a Baby Grows During Pregnancy  Pregnancy begins when a female's sperm enters a female's egg (fertilization). Fertilization usually happens in one of the tubes (fallopian tubes) that connect the ovaries to the  womb (uterus). The fertilized egg moves down the fallopian tube to the uterus. Once it reaches the uterus, it implants into the lining of the uterus and begins to grow. For the first 10 weeks, the fertilized egg is called an embryo. After 10 weeks, it is called a fetus. As the fetus continues to grow, it receives oxygen and nutrients through tissue (placenta) that grows to support the developing baby. The placenta is the life support system for the baby. It provides  oxygen and nutrition and removes waste. Learning as much as you can about your pregnancy and how your baby is developing can help you enjoy the experience. It can also make you aware of when there might be a problem and when to ask questions. How long does a typical pregnancy last? A pregnancy usually lasts 280 days, or about 40 weeks. Pregnancy is divided into three periods of growth, also called trimesters:  First trimester: 0-12 weeks.  Second trimester: 13-27 weeks.  Third trimester: 28-40 weeks. The day when your baby is ready to be born (full term) is your estimated date of delivery. How does my baby develop month by month? First month  The fertilized egg attaches to the inside of the uterus.  Some cells will form the placenta. Others will form the fetus.  The arms, legs, brain, spinal cord, lungs, and heart begin to develop.  At the end of the first month, the heart begins to beat. Second month  The bones, inner ear, eyelids, hands, and feet form.  The genitals develop.  By the end of 8 weeks, all major organs are developing. Third month  All of the internal organs are forming.  Teeth develop below the gums.  Bones and muscles begin to grow. The spine can flex.  The skin is transparent.  Fingernails and toenails begin to form.  Arms and legs continue to grow longer, and hands and feet develop.  The fetus is about 3 inches (7.6 cm) long. Fourth month  The placenta is completely formed.  The external sex organs, neck, outer ear, eyebrows, eyelids, and fingernails are formed.  The fetus can hear, swallow, and move its arms and legs.  The kidneys begin to produce urine.  The skin is covered with a white, waxy coating (vernix) and very fine hair (lanugo). Fifth month  The fetus moves around more and can be felt for the first time (quickening).  The fetus starts to sleep and wake up and may begin to suck its finger.  The nails grow to the end of the  fingers.  The organ in the digestive system that makes bile (gallbladder) functions and helps to digest nutrients.  If your baby is a girl, eggs are present in her ovaries. If your baby is a boy, testicles start to move down into his scrotum. Sixth month  The lungs are formed.  The eyes open. The brain continues to develop.  Your baby has fingerprints and toe prints. Your baby's hair grows thicker.  At the end of the second trimester, the fetus is about 9 inches (22.9 cm) long. Seventh month  The fetus kicks and stretches.  The eyes are developed enough to sense changes in light.  The hands can make a grasping motion.  The fetus responds to sound. Eighth month  All organs and body systems are fully developed and functioning.  Bones harden, and taste buds develop. The fetus may hiccup.  Certain areas of the brain are still developing. The skull remains soft.   Ninth month  The fetus gains about  lb (0.23 kg) each week.  The lungs are fully developed.  Patterns of sleep develop.  The fetus's head typically moves into a head-down position (vertex) in the uterus to prepare for birth.  The fetus weighs 6-9 lb (2.72-4.08 kg) and is 19-20 inches (48.26-50.8 cm) long. What can I do to have a healthy pregnancy and help my baby develop? General instructions  Take prenatal vitamins as directed by your health care provider. These include vitamins such as folic acid, iron, calcium, and vitamin D. They are important for healthy development.  Take medicines only as directed by your health care provider. Read labels and ask a pharmacist or your health care provider whether over-the-counter medicines, supplements, and prescription drugs are safe to take during pregnancy.  Keep all follow-up visits as directed by your health care provider. This is important. Follow-up visits include prenatal care and screening tests. How do I know if my baby is developing well? At each prenatal visit,  your health care provider will do several different tests to check on your health and keep track of your baby's development. These include:  Fundal height and position. ? Your health care provider will measure your growing belly from your pubic bone to the top of the uterus using a tape measure. ? Your health care provider will also feel your belly to determine your baby's position.  Heartbeat. ? An ultrasound in the first trimester can confirm pregnancy and show a heartbeat, depending on how far along you are. ? Your health care provider will check your baby's heart rate at every prenatal visit.  Second trimester ultrasound. ? This ultrasound checks your baby's development. It also may show your baby's gender. What should I do if I have concerns about my baby's development? Always talk with your health care provider about any concerns that you may have about your pregnancy and your baby. Summary  A pregnancy usually lasts 280 days, or about 40 weeks. Pregnancy is divided into three periods of growth, also called trimesters.  Your health care provider will monitor your baby's growth and development throughout your pregnancy.  Follow your health care provider's recommendations about taking prenatal vitamins and medicines during your pregnancy.  Talk with your health care provider if you have any concerns about your pregnancy or your developing baby. This information is not intended to replace advice given to you by your health care provider. Make sure you discuss any questions you have with your health care provider. Document Revised: 01/31/2019 Document Reviewed: 08/23/2017 Elsevier Patient Education  2020 [place] of Pregnancy  The first trimester of pregnancy is from week 1 until the end of week 13 (months 1 through 3). During this time, your baby will begin to develop inside you. At 6-8 weeks, the eyes and face are formed, and the heartbeat can be seen on  ultrasound. At the end of 12 weeks, all the baby's organs are formed. Prenatal care is all the medical care you receive before the birth of your baby. Make sure you get good prenatal care and follow all of your doctor's instructions. Follow these instructions at home: Medicines  Take over-the-counter and prescription medicines only as told by your doctor. Some medicines are safe and some medicines are not safe during pregnancy.  Take a prenatal vitamin that contains at least 600 micrograms (mcg) of folic acid.  If you have trouble pooping (constipation), take medicine that will make your stool soft (stool  softener) if your doctor approves. Eating and drinking   Eat regular, healthy meals.  Your doctor will tell you the amount of weight gain that is right for you.  Avoid raw meat and uncooked cheese.  If you feel sick to your stomach (nauseous) or throw up (vomit): ? Eat 4 or 5 small meals a day instead of 3 large meals. ? Try eating a few soda crackers. ? Drink liquids between meals instead of during meals.  To prevent constipation: ? Eat foods that are high in fiber, like fresh fruits and vegetables, whole grains, and beans. ? Drink enough fluids to keep your pee (urine) clear or pale yellow. Activity  Exercise only as told by your doctor. Stop exercising if you have cramps or pain in your lower belly (abdomen) or low back.  Do not exercise if it is too hot, too humid, or if you are in a place of great height (high altitude).  Try to avoid standing for long periods of time. Move your legs often if you must stand in one place for a long time.  Avoid heavy lifting.  Wear low-heeled shoes. Sit and stand up straight.  You can have sex unless your doctor tells you not to. Relieving pain and discomfort  Wear a good support bra if your breasts are sore.  Take warm water baths (sitz baths) to soothe pain or discomfort caused by hemorrhoids. Use hemorrhoid cream if your doctor says  it is okay.  Rest with your legs raised if you have leg cramps or low back pain.  If you have puffy, bulging veins (varicose veins) in your legs: ? Wear support hose or compression stockings as told by your doctor. ? Raise (elevate) your feet for 15 minutes, 3-4 times a day. ? Limit salt in your food. Prenatal care  Schedule your prenatal visits by the twelfth week of pregnancy.  Write down your questions. Take them to your prenatal visits.  Keep all your prenatal visits as told by your doctor. This is important. Safety  Wear your seat belt at all times when driving.  Make a list of emergency phone numbers. The list should include numbers for family, friends, the hospital, and police and fire departments. General instructions  Ask your doctor for a referral to a local prenatal class. Begin classes no later than at the start of month 6 of your pregnancy.  Ask for help if you need counseling or if you need help with nutrition. Your doctor can give you advice or tell you where to go for help.  Do not use hot tubs, steam rooms, or saunas.  Do not douche or use tampons or scented sanitary pads.  Do not cross your legs for long periods of time.  Avoid all herbs and alcohol. Avoid drugs that are not approved by your doctor.  Do not use any tobacco products, including cigarettes, chewing tobacco, and electronic cigarettes. If you need help quitting, ask your doctor. You may get counseling or other support to help you quit.  Avoid cat litter boxes and soil used by cats. These carry germs that can cause birth defects in the baby and can cause a loss of your baby (miscarriage) or stillbirth.  Visit your dentist. At home, brush your teeth with a soft toothbrush. Be gentle when you floss. Contact a doctor if:  You are dizzy.  You have mild cramps or pressure in your lower belly.  You have a nagging pain in your belly area.  You  continue to feel sick to your stomach, you throw up, or  you have watery poop (diarrhea).  You have a bad smelling fluid coming from your vagina.  You have pain when you pee (urinate).  You have increased puffiness (swelling) in your face, hands, legs, or ankles. Get help right away if:  You have a fever.  You are leaking fluid from your vagina.  You have spotting or bleeding from your vagina.  You have very bad belly cramping or pain.  You gain or lose weight rapidly.  You throw up blood. It may look like coffee grounds.  You are around people who have [place] measles, fifth disease, or chickenpox.  You have a very bad headache.  You have shortness of breath.  You have any kind of trauma, such as from a fall or a car accident. Summary  The first trimester of pregnancy is from week 1 until the end of week 13 (months 1 through 3).  To take care of yourself and your unborn baby, you will need to eat healthy meals, take medicines only if your doctor tells you to do so, and do activities that are safe for you and your baby.  Keep all follow-up visits as told by your doctor. This is important as your doctor will have to ensure that your baby is healthy and growing well. This information is not intended to replace advice given to you by your health care provider. Make sure you discuss any questions you have with your health care provider. Document Revised: 01/31/2019 Document Reviewed: 10/18/2016 Elsevier Patient Education  2020 [employer]. Commonly Asked Questions During Pregnancy  Cats: A parasite can be excreted in cat feces.  To avoid exposure you need to have another person empty the little box.  If you must empty the litter box you will need to wear gloves.  Wash your hands after handling your cat.  This parasite can also be found in raw or undercooked meat so this should also be avoided.  Colds, Sore Throats, Flu: Please check your medication sheet to see what you can take for symptoms.  If your symptoms are unrelieved by these  medications please call the office.  Dental Work: Most any dental work [employer], corporate recommends is permitted.  X-rays should only be taken during the first trimester if absolutely necessary.  Your abdomen should be shielded with a lead apron during all x-rays.  Please notify your provider prior to receiving any x-rays.  Novocaine is fine; gas is not recommended.  If your dentist requires a note from [place] prior to dental work please call the office and we will provide one for you.  Exercise: Exercise is an important part of staying healthy during your pregnancy.  You may continue most exercises you were accustomed to prior to pregnancy.  Later in your pregnancy you will most likely notice you have difficulty with activities requiring balance like riding a bicycle.  It is important that you listen to your body and avoid activities that put you at a higher risk of falling.  Adequate rest and staying well hydrated are a must!  If you have questions about the safety of specific activities ask your provider.    Exposure to Children with illness: Try to avoid obvious exposure; report any symptoms to [place] when noted,  If you have chicken pos, red measles or mumps, you should be immune to these diseases.   Please do not take any vaccines while pregnant unless you have checked  with your OB provider.  Fetal Movement: After 28 weeks we recommend you do "kick counts" twice daily.  Lie or sit down in a calm quiet environment and count your baby movements "kicks".  You should feel your baby at least 10 times per hour.  If you have not felt 10 kicks within the first hour get up, walk around and have something sweet to eat or drink then repeat for an additional hour.  If count remains less than 10 per hour notify your provider.  Fumigating: Follow your pest control agent's advice as to how long to stay out of your home.  Ventilate the area well before re-entering.  Hemorrhoids:   Most over-the-counter preparations can be used  during pregnancy.  Check your medication to see what is safe to use.  It is important to use a stool softener or fiber in your diet and to drink lots of liquids.  If hemorrhoids seem to be getting worse please call the office.   Hot Tubs:  Hot tubs Jacuzzis and saunas are not recommended while pregnant.  These increase your internal body temperature and should be avoided.  Intercourse:  Sexual intercourse is safe during pregnancy as long as you are comfortable, unless otherwise advised by your provider.  Spotting may occur after intercourse; report any bright red bleeding that is heavier than spotting.  Labor:  If you know that you are in labor, please go to the hospital.  If you are unsure, please call the office and let us help you decide what to do.  Lifting, straining, etc:  If your job requires heavy lifting or straining please check with your provider for any limitations.  Generally, you should not lift items heavier than that you can lift simply with your hands and arms (no back muscles)  Painting:  Paint fumes do not harm your pregnancy, but may make you ill and should be avoided if possible.  Latex or water based paints have less odor than oils.  Use adequate ventilation while painting.  Permanents & Hair Color:  Chemicals in hair dyes are not recommended as they cause increase hair dryness which can increase hair loss during pregnancy.  " Highlighting" and permanents are allowed.  Dye may be absorbed differently and permanents may not hold as well during pregnancy.  Sunbathing:  Use a sunscreen, as skin burns easily during pregnancy.  Drink plenty of fluids; avoid over heating.  Tanning Beds:  Because their possible side effects are still unknown, tanning beds are not recommended.  Ultrasound Scans:  Routine ultrasounds are performed at approximately 20 weeks.  You will be able to see your baby's general anatomy an if you would like to know the gender this can usually be determined as  well.  If it is questionable when you conceived you may also receive an ultrasound early in your pregnancy for dating purposes.  Otherwise ultrasound exams are not routinely performed unless there is a medical necessity.  Although you can request a scan we ask that you pay for it when conducted because insurance does not cover " patient request" scans.  Work: If your pregnancy proceeds without complications you may work until your due date, unless your physician or employer advises otherwise.  Round Ligament Pain/Pelvic Discomfort:  Sharp, shooting pains not associated with bleeding are fairly common, usually occurring in the second trimester of pregnancy.  They tend to be worse when standing up or when you remain standing for long periods of time.  These are  the result of pressure of certain pelvic ligaments called "round ligaments".  Rest, Tylenol and heat seem to be the most effective relief.  As the womb and fetus grow, they rise out of the pelvis and the discomfort improves.  Please notify the office if your pain seems different than that described.  It may represent a more serious condition.  Common Medications Safe in Pregnancy  Acne:      Constipation:  Benzoyl Peroxide     Colace  Clindamycin      Dulcolax Suppository  Topica Erythromycin     Fibercon  Salicylic Acid      Metamucil         Miralax AVOID:        Senakot   Accutane    Cough:  Retin-A       Cough Drops  Tetracycline      Phenergan w/ Codeine if Rx  Minocycline      Robitussin (Plain & DM)  Antibiotics:     Crabs/Lice:  Ceclor       RID  Cephalosporins    AVOID:  E-Mycins      Kwell  Keflex  Macrobid/Macrodantin   Diarrhea:  Penicillin      Kao-Pectate  Zithromax      Imodium AD         PUSH FLUIDS AVOID:       Cipro     Fever:  Tetracycline      Tylenol (Regular or Extra  Minocycline       Strength)  Levaquin      Extra Strength-Do not          Exceed 8 tabs/24 hrs Caffeine:        <294m/day (equiv. To 1 cup  of coffee or  approx. 3 12 oz sodas)         Gas: Cold/Hayfever:       Gas-X  Benadryl      Mylicon  Claritin       Phazyme  **Claritin-D        Chlor-Trimeton    Headaches:  Dimetapp      ASA-Free Excedrin  Drixoral-Non-Drowsy     Cold Compress  Mucinex (Guaifenasin)     Tylenol (Regular or Extra  Sudafed/Sudafed-12 Hour     Strength)  **Sudafed PE Pseudoephedrine   Tylenol Cold & Sinus     Vicks Vapor Rub  Zyrtec  **AVOID if Problems With Blood Pressure         Heartburn: Avoid lying down for at least 1 hour after meals  Aciphex      Maalox     Rash:  Milk of Magnesia     Benadryl    Mylanta       1% Hydrocortisone Cream  Pepcid  Pepcid Complete   Sleep Aids:  Prevacid      Ambien   Prilosec       Benadryl  Rolaids       Chamomile Tea  Tums (Limit 4/day)     Unisom  Zantac       Tylenol PM         Warm milk-add vanilla or  Hemorrhoids:       Sugar for taste  Anusol/Anusol H.C.  (RX: Analapram 2.5%)  Sugar Substitutes:  Hydrocortisone OTC     Ok in moderation  Preparation H      Tucks        Vaseline lotion applied to tissue with wiping    Herpes:  Throat:  Acyclovir      Oragel  Famvir  Valtrex     Vaccines:         Flu Shot Leg Cramps:       *Gardasil  Benadryl      Hepatitis A         Hepatitis B Nasal Spray:       Pneumovax  Saline Nasal Spray     Polio Booster         Tetanus Nausea:       Tuberculosis test or PPD  Vitamin B6 25 mg TID   AVOID:    Dramamine      *Gardasil  Emetrol       Live Poliovirus  Ginger Root 250 mg QID    MMR (measles, mumps &  High Complex Carbs @ Bedtime    rebella)  Sea Bands-Accupressure    Varicella (Chickenpox)  Unisom 1/2 tab TID     *No known complications           If received before Pain:         Known pregnancy;   Darvocet       Resume series after  Lortab        Delivery  Percocet    Yeast:   Tramadol      Femstat  Tylenol  3      Gyne-lotrimin  Ultram       Monistat  Vicodin           MISC:         All Sunscreens           Hair Coloring/highlights          Insect Repellant's          (Including DEET)         Mystic Tans

## 2020-03-02 NOTE — Progress Notes (Signed)
      Laura Hernandez presents for NOB nurse intake visit. Pregnancy confirmation done at [facility], 02/08/2020, with Craige Cotta, MD .  G 1.  P 0010.  LMP 12/31/2019.  EDD 10/06/2020.  Ga [redacted]w[redacted]d. Pregnancy education material explained and given.   0 cats in the home.  NOB labs ordered. BMI greater than 30. TSH/HbgA1c odered. Sickle cell order due to race. HIV and drug screen explained and ordered. Genetic screening discussed. Genetic testing; Unsure. Pt to discuss genetic testing with provider. PNV encouraged. Pt to follow up with provider in 4 weeks for NOB physical and 1 week for u/s.

## 2020-03-03 LAB — URINALYSIS, ROUTINE W REFLEX MICROSCOPIC
Bilirubin, UA: NEGATIVE
Glucose, UA: NEGATIVE
Ketones, UA: NEGATIVE
Nitrite, UA: NEGATIVE
Protein,UA: NEGATIVE
RBC, UA: NEGATIVE
Specific Gravity, UA: 1.016 (ref 1.005–1.030)
Urobilinogen, Ur: 0.2 mg/dL (ref 0.2–1.0)
pH, UA: 6.5 (ref 5.0–7.5)

## 2020-03-03 LAB — DRUG PROFILE, UR, 9 DRUGS (LABCORP)
Amphetamines, Urine: NEGATIVE ng/mL
Barbiturate Quant, Ur: NEGATIVE ng/mL
Benzodiazepine Quant, Ur: NEGATIVE ng/mL
Cannabinoid Quant, Ur: NEGATIVE ng/mL
Cocaine (Metab.): NEGATIVE ng/mL
Methadone Screen, Urine: NEGATIVE ng/mL
Opiate Quant, Ur: NEGATIVE ng/mL
PCP Quant, Ur: NEGATIVE ng/mL
Propoxyphene: NEGATIVE ng/mL

## 2020-03-03 LAB — MICROSCOPIC EXAMINATION
Casts: NONE SEEN /lpf
Epithelial Cells (non renal): >10 /hpf — AB (ref 0–10)
WBC, UA: >30 /hpf — AB (ref 0–5)

## 2020-03-03 LAB — NICOTINE SCREEN, URINE: Cotinine Ql Scrn, Ur: NEGATIVE ng/mL

## 2020-03-04 LAB — VARICELLA ZOSTER ANTIBODY, IGG: Varicella zoster IgG: 3849 index (ref >165)

## 2020-03-04 LAB — ABO AND RH: Rh Factor: POSITIVE

## 2020-03-04 LAB — AB SCR+ANTIBODY ID: Antibody Screen: POSITIVE — AB

## 2020-03-04 LAB — TOXOPLASMA ANTIBODIES- IGG AND  IGM
Toxoplasma Antibody- IgM: 4 AU/mL (ref 0.0–7.9)
Toxoplasma IgG Ratio: <3 IU/mL (ref 0.0–7.1)

## 2020-03-04 LAB — HEMOGLOBIN A1C
Est. average glucose Bld gHb Est-mCnc: 111 mg/dL
Hgb A1c MFr Bld: 5.5 % (ref 4.8–5.6)

## 2020-03-04 LAB — GC/CHLAMYDIA PROBE AMP
Chlamydia trachomatis, NAA: NEGATIVE
Neisseria Gonorrhoeae by PCR: NEGATIVE

## 2020-03-04 LAB — RPR: RPR Ser Ql: NONREACTIVE

## 2020-03-04 LAB — TSH: TSH: 1.29 u[IU]/mL (ref 0.450–4.500)

## 2020-03-04 LAB — ANTIBODY SCREEN

## 2020-03-04 LAB — HEPATITIS B SURFACE ANTIGEN: Hepatitis B Surface Ag: NEGATIVE

## 2020-03-04 LAB — HIV ANTIBODY (ROUTINE TESTING W REFLEX): HIV Screen 4th Generation wRfx: NONREACTIVE

## 2020-03-04 LAB — HGB SOLU + RFLX FRAC: Sickle Solubility Test - HGBRFX: NEGATIVE

## 2020-03-04 LAB — RUBELLA SCREEN: Rubella Antibodies, IGG: 5.61 index (ref >0.99)

## 2020-03-05 LAB — URINE CULTURE, OB REFLEX

## 2020-03-05 LAB — CULTURE, OB URINE

## 2020-03-06 ENCOUNTER — Other Ambulatory Visit: Payer: Self-pay | Admitting: Surgical

## 2020-03-06 MED ORDER — ONDANSETRON 4 MG PO TBDP
4.0000 mg | ORAL_TABLET | Freq: Four times a day (QID) | ORAL | 0 refills | Status: DC | PRN
Start: 2020-03-06 — End: 2020-04-24

## 2020-03-10 ENCOUNTER — Encounter: Payer: Self-pay | Admitting: Obstetrics and Gynecology

## 2020-03-16 ENCOUNTER — Other Ambulatory Visit: Payer: Self-pay

## 2020-03-16 ENCOUNTER — Ambulatory Visit (INDEPENDENT_AMBULATORY_CARE_PROVIDER_SITE_OTHER): Payer: BC Managed Care – PPO

## 2020-03-16 DIAGNOSIS — Z3491 Encounter for supervision of normal pregnancy, unspecified, first trimester: Secondary | ICD-10-CM | POA: Diagnosis not present

## 2020-03-20 ENCOUNTER — Encounter: Payer: BC Managed Care – PPO | Admitting: Obstetrics and Gynecology

## 2020-03-27 ENCOUNTER — Other Ambulatory Visit: Payer: Self-pay

## 2020-03-27 ENCOUNTER — Telehealth: Payer: Self-pay | Admitting: Certified Nurse Midwife

## 2020-03-27 ENCOUNTER — Encounter: Payer: Self-pay | Admitting: Surgical

## 2020-03-27 ENCOUNTER — Ambulatory Visit (INDEPENDENT_AMBULATORY_CARE_PROVIDER_SITE_OTHER): Payer: BC Managed Care – PPO | Admitting: Obstetrics and Gynecology

## 2020-03-27 ENCOUNTER — Encounter: Payer: Self-pay | Admitting: Obstetrics and Gynecology

## 2020-03-27 ENCOUNTER — Telehealth: Payer: Self-pay | Admitting: Obstetrics and Gynecology

## 2020-03-27 VITALS — BP 99/67 | HR 78 | Wt 193.1 lb

## 2020-03-27 DIAGNOSIS — Z3A12 12 weeks gestation of pregnancy: Secondary | ICD-10-CM

## 2020-03-27 DIAGNOSIS — Z3491 Encounter for supervision of normal pregnancy, unspecified, first trimester: Secondary | ICD-10-CM

## 2020-03-27 LAB — POCT URINALYSIS DIPSTICK OB
Bilirubin, UA: NEGATIVE
Blood, UA: NEGATIVE
Glucose, UA: NEGATIVE
Ketones, UA: NEGATIVE
Leukocytes, UA: NEGATIVE
Nitrite, UA: NEGATIVE
Spec Grav, UA: 1.015 (ref 1.010–1.025)
Urobilinogen, UA: 0.2 E.U./dL
pH, UA: 7 (ref 5.0–8.0)

## 2020-03-27 NOTE — Telephone Encounter (Signed)
Pt called her work is being really hard on her, she stated that she has bad nausea but they told her that she would be let go. If she doesn't get a note for her leave.  This pt is requesting a call back. Please advise

## 2020-03-27 NOTE — Telephone Encounter (Signed)
Error wrong proivder

## 2020-03-27 NOTE — Telephone Encounter (Signed)
Have sent the patient a note for the employer to let patient go to the restroom more frequently due to nausea and vomiting. This has been sent through patients my chart.

## 2020-03-27 NOTE — Progress Notes (Signed)
NOB: She presents today for her first prenatal visit.  She has a long history of infertility and was found to have elevated prolactin.  She took Dostinex and became pregnant.  She is very excited about being pregnant.  Her first sign of pregnancy was back pain and [facility] went [facility] where she was found to be pregnant.  An ultrasound here reveals her to be approximately 12 weeks estimated gestational age. She does complain of daily nausea and has not been taking her prenatal vitamins.  She cannot keep liquids down and some foods.  She desires genetic testing.  Physical examination General NAD, Conversant  HEENT Atraumatic; Op clear with mmm.  Normo-cephalic. Pupils reactive. Anicteric sclerae  Thyroid/Neck Smooth without nodularity or enlargement. Normal ROM.  Neck Supple.  Skin No rashes, lesions or ulceration. Normal palpated skin turgor. No nodularity.  Breasts: No masses or discharge.  Symmetric.  No axillary adenopathy.  Lungs: Clear to auscultation.No rales or wheezes. Normal Respiratory effort, no retractions.  Heart: NSR.  No murmurs or rubs appreciated. No periferal edema  Abdomen: Soft.  Non-tender.  No masses.  No HSM. No hernia  Extremities: Moves all appropriately.  Normal ROM for age. No lymphadenopathy.  Neuro: Oriented to PPT.  Normal mood. Normal affect.     Pelvic:   Vulva: Normal appearance.  No lesions.  Vagina: No lesions or abnormalities noted.  Support: Normal pelvic support.  Urethra No masses tenderness or scarring.  Meatus Normal size without lesions or prolapse.  Cervix: Normal appearance.  No lesions.  Anus: Normal exam.  No lesions.  Perineum: Normal exam.  No lesions.        Bimanual   Adnexae: No masses.  Non-tender to palpation.  Uterus: Enlarged. 13 wks  165  Non-tender.  Mobile.  AV.  Adnexae: No masses.  Non-tender to palpation.  Cul-de-sac: Negative for abnormality.  Adnexae: No masses.  Non-tender to palpation.         Pelvimetry   Diagonal:  Reached.  Spines: Average.  Sacrum: Concave.  Pubic Arch: Normal.   Patient given information regarding nausea and vomiting of pregnancy, encouraged to continue adequate liquids daily. Genetic testing performed today. Consider AFP at next visit.

## 2020-03-28 LAB — CBC WITH DIFFERENTIAL/PLATELET
Basophils Absolute: 0 10*3/uL (ref 0.0–0.2)
Basos: 0 %
EOS (ABSOLUTE): 0 10*3/uL (ref 0.0–0.4)
Eos: 0 %
Hematocrit: 35.2 % (ref 34.0–46.6)
Hemoglobin: 11.3 g/dL (ref 11.1–15.9)
Immature Grans (Abs): 0 10*3/uL (ref 0.0–0.1)
Immature Granulocytes: 0 %
Lymphocytes Absolute: 1.4 10*3/uL (ref 0.7–3.1)
Lymphs: 12 %
MCH: 21.7 pg — ABNORMAL LOW (ref 26.6–33.0)
MCHC: 32.1 g/dL (ref 31.5–35.7)
MCV: 68 fL — ABNORMAL LOW (ref 79–97)
Monocytes Absolute: 0.5 10*3/uL (ref 0.1–0.9)
Monocytes: 4 %
Neutrophils Absolute: 10.1 10*3/uL — ABNORMAL HIGH (ref 1.4–7.0)
Neutrophils: 84 %
Platelets: 335 10*3/uL (ref 150–450)
RBC: 5.21 x10E6/uL (ref 3.77–5.28)
RDW: 15.3 % (ref 11.7–15.4)
WBC: 12.1 10*3/uL — ABNORMAL HIGH (ref 3.4–10.8)

## 2020-03-30 ENCOUNTER — Telehealth: Payer: Self-pay | Admitting: Obstetrics and Gynecology

## 2020-03-30 NOTE — Telephone Encounter (Signed)
Spoke with patient and she said that her job is wanting her to have intermittent leave. I wrote her a note for more frequent bathroom breaks due to nausea and vomiting. This was sent on Friday. Patient is now wanting FMLA papers filled out to reduce her hours and to be able to sit more. Patient did not discuss anything with Dr. Logan Bores last week at her appointment. Her original FMLA papers was for her to take off a year. I explained that we can't take some out of work for a year due to pregnancy. Pregnancy is not considered a disability. Patient then stated that she worked this weekend and her feet started to swell. Patient stated that she works 12 hour shifts. I have scheduled her to come in and see Dr. Valentino Saxon tomorrow.

## 2020-03-30 NOTE — Telephone Encounter (Signed)
LM for patient to return call.

## 2020-03-30 NOTE — Telephone Encounter (Signed)
Pt called in and stated that she has some questions about her FMLA. The pt is requesting a call back. The wants a work note, stating her can sit. They are stating that her work will fire her. Please advise

## 2020-03-31 ENCOUNTER — Ambulatory Visit (INDEPENDENT_AMBULATORY_CARE_PROVIDER_SITE_OTHER): Payer: BC Managed Care – PPO | Admitting: Obstetrics and Gynecology

## 2020-03-31 ENCOUNTER — Encounter: Payer: Self-pay | Admitting: Obstetrics and Gynecology

## 2020-03-31 ENCOUNTER — Other Ambulatory Visit: Payer: Self-pay

## 2020-03-31 VITALS — BP 117/77 | HR 77 | Wt 192.7 lb

## 2020-03-31 DIAGNOSIS — Z3482 Encounter for supervision of other normal pregnancy, second trimester: Secondary | ICD-10-CM

## 2020-03-31 DIAGNOSIS — Z3A13 13 weeks gestation of pregnancy: Secondary | ICD-10-CM

## 2020-03-31 DIAGNOSIS — R760 Raised antibody titer: Secondary | ICD-10-CM

## 2020-03-31 DIAGNOSIS — O219 Vomiting of pregnancy, unspecified: Secondary | ICD-10-CM

## 2020-03-31 DIAGNOSIS — O99820 Streptococcus B carrier state complicating pregnancy: Secondary | ICD-10-CM | POA: Insufficient documentation

## 2020-03-31 LAB — POCT URINALYSIS DIPSTICK OB
Bilirubin, UA: NEGATIVE
Blood, UA: NEGATIVE
Glucose, UA: NEGATIVE
Ketones, UA: NEGATIVE
Nitrite, UA: NEGATIVE
POC,PROTEIN,UA: NEGATIVE
Spec Grav, UA: 1.01 (ref 1.010–1.025)
Urobilinogen, UA: 0.2 E.U./dL
pH, UA: 7.5 (ref 5.0–8.0)

## 2020-03-31 MED ORDER — SCOPOLAMINE 1 MG/3DAYS TD PT72: 1.0000 | MEDICATED_PATCH | TRANSDERMAL | 2 refills | Status: DC

## 2020-03-31 NOTE — Progress Notes (Signed)
Problem OB: Patient complains of ankle and leg swelling while working. 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  Symptoms mostly noted when standing for prolonged periods of time.  Also notes some back discomfort. Advised on compression stockings. Notes that her job desires for her to fill out FMLA papers. Letter given for work duty modifications/more frequent breaks. Discussed that FMLA did not need to be used at this time except for intermittent leave for office visits.   Patient also still complains of nausea and vomiting and headaches.  Vitamin B6 helps, but only takes once a day. Zofran makes her feel worse. Using sea bands without much relief.  Advised that she can increase the Vitamin B6 to 3 times daily, also can take Unisom in the evenings. Will prescribe scopolamine patch.   Reviewed NOB labs with patient, GBS+ urine culture (only 7K colonies). Also, genetic testing ordered but not drawn.  Will draw today.  Lastly, antibody screen positive, but weak response, recommend repeat testing within 4 weeks, can perform today.   RTC in 3 weeks for routine OB visit.

## 2020-03-31 NOTE — Patient Instructions (Signed)
Morning Sickness ° °Morning sickness is when you feel sick to your stomach (nauseous) during pregnancy. You may feel sick to your stomach and throw up (vomit). You may feel sick in the morning, but you can feel this way at any time of day. Some women feel very sick to their stomach and cannot stop throwing up (hyperemesis gravidarum). °Follow these instructions at home: °Medicines °· Take over-the-counter and prescription medicines only as told by your doctor. Do not take any medicines until you talk with your doctor about them first. °· Taking multivitamins before getting pregnant can stop or lessen the harshness of morning sickness. °Eating and drinking °· Eat dry toast or crackers before getting out of bed. °· Eat 5 or 6 small meals a day. °· Eat dry and bland foods like rice and baked potatoes. °· Do not eat greasy, fatty, or spicy foods. °· Have someone cook for you if the smell of food causes you to feel sick or throw up. °· If you feel sick to your stomach after taking prenatal vitamins, take them at night or with a snack. °· Eat protein when you need a snack. Nuts, yogurt, and cheese are good choices. °· Drink fluids throughout the day. °· Try ginger ale made with real ginger, ginger tea made from fresh grated ginger, or ginger candies. °General instructions °· Do not use any products that have nicotine or tobacco in them, such as cigarettes and e-cigarettes. If you need help quitting, ask your doctor. °· Use an air purifier to keep the air in your house free of smells. °· Get lots of fresh air. °· Try to avoid smells that make you feel sick. °· Try: °? Wearing a bracelet that is used for seasickness (acupressure wristband). °? Going to a doctor who puts thin needles into certain body points (acupuncture) to improve how you feel. °Contact a doctor if: °· You need medicine to feel better. °· You feel dizzy or light-headed. °· You are losing weight. °Get help right away if: °· You feel very sick to your  stomach and cannot stop throwing up. °· You pass out (faint). °· You have very bad pain in your belly. °Summary °· Morning sickness is when you feel sick to your stomach (nauseous) during pregnancy. °· You may feel sick in the morning, but you can feel this way at any time of day. °· Making some changes to what you eat may help your symptoms go away. °This information is not intended to replace advice given to you by your health care provider. Make sure you discuss any questions you have with your health care provider. °Document Revised: 09/22/2017 Document Reviewed: 11/10/2016 °Elsevier Patient Education © 2020 [employer] ° °

## 2020-03-31 NOTE — Progress Notes (Signed)
ROB-Pt present due to having pain and swelling in her legs/feet and having all day nausea and headaches.

## 2020-04-01 LAB — MATERNIT21  PLUS CORE+ESS+SCA, BLOOD

## 2020-04-02 LAB — "ABO AND RH ": Rh Factor: POSITIVE

## 2020-04-02 LAB — AB SCR+ANTIBODY ID: Antibody Screen: POSITIVE — AB

## 2020-04-02 LAB — ANTIBODY SCREEN

## 2020-04-21 ENCOUNTER — Telehealth: Payer: Self-pay | Admitting: Obstetrics and Gynecology

## 2020-04-21 NOTE — Telephone Encounter (Signed)
Spoke with patient and it is not the FMLA paperwork that she needs. It is the 4 forms that she sent through my chart to Dr. Valentino Saxon. [name] stated that she has put them on Dr. Valentino Saxon desk to be filled out.

## 2020-04-21 NOTE — Telephone Encounter (Signed)
Patient called in checking on the status of her FMLA paperwork. Patient states they are due today and she has not heard anything from [place]. Patient stated that she turned them in early May and was under the impression it would be about 2 weeks for them to be completed. Could you please advise?

## 2020-04-23 NOTE — Telephone Encounter (Signed)
Please see mychart message encounters

## 2020-04-24 ENCOUNTER — Ambulatory Visit (INDEPENDENT_AMBULATORY_CARE_PROVIDER_SITE_OTHER): Payer: BC Managed Care – PPO | Admitting: Obstetrics and Gynecology

## 2020-04-24 ENCOUNTER — Encounter: Payer: Self-pay | Admitting: Obstetrics and Gynecology

## 2020-04-24 ENCOUNTER — Other Ambulatory Visit: Payer: Self-pay

## 2020-04-24 VITALS — BP 115/73 | HR 79 | Wt 188.4 lb

## 2020-04-24 DIAGNOSIS — R824 Acetonuria: Secondary | ICD-10-CM

## 2020-04-24 DIAGNOSIS — O219 Vomiting of pregnancy, unspecified: Secondary | ICD-10-CM

## 2020-04-24 DIAGNOSIS — Z3A16 16 weeks gestation of pregnancy: Secondary | ICD-10-CM

## 2020-04-24 DIAGNOSIS — Z3482 Encounter for supervision of other normal pregnancy, second trimester: Secondary | ICD-10-CM

## 2020-04-24 LAB — POCT URINALYSIS DIPSTICK OB
Bilirubin, UA: NEGATIVE
Blood, UA: NEGATIVE
Glucose, UA: NEGATIVE
Nitrite, UA: NEGATIVE
Spec Grav, UA: >=1.03 — AB (ref 1.010–1.025)
Urobilinogen, UA: 0.2 E.U./dL
pH, UA: 6 (ref 5.0–8.0)

## 2020-04-24 LAB — COMPREHENSIVE METABOLIC PANEL
ALT: 14 IU/L (ref 0–32)
AST: 18 IU/L (ref 0–40)
Albumin/Globulin Ratio: 1.2 (ref 1.2–2.2)
Albumin: 3.8 g/dL — ABNORMAL LOW (ref 3.9–5.0)
Alkaline Phosphatase: 77 IU/L (ref 48–121)
BUN/Creatinine Ratio: 7 — ABNORMAL LOW (ref 9–23)
BUN: 4 mg/dL — ABNORMAL LOW (ref 6–20)
Bilirubin Total: 0.4 mg/dL (ref 0.0–1.2)
CO2: 20 mmol/L (ref 20–29)
Calcium: 9.6 mg/dL (ref 8.7–10.2)
Chloride: 102 mmol/L (ref 96–106)
Creatinine, Ser: 0.57 mg/dL (ref 0.57–1.00)
GFR calc Af Amer: 146 mL/min/{1.73_m2} (ref >59)
GFR calc non Af Amer: 127 mL/min/{1.73_m2} (ref >59)
Globulin, Total: 3.3 g/dL (ref 1.5–4.5)
Glucose: 80 mg/dL (ref 65–99)
Potassium: 4.3 mmol/L (ref 3.5–5.2)
Sodium: 135 mmol/L (ref 134–144)
Total Protein: 7.1 g/dL (ref 6.0–8.5)

## 2020-04-24 LAB — CBC WITH DIFFERENTIAL
Basophils Absolute: 0 10*3/uL (ref 0.0–0.2)
Basos: 0 %
EOS (ABSOLUTE): 0.1 10*3/uL (ref 0.0–0.4)
Eos: 1 %
Hematocrit: 35.1 % (ref 34.0–46.6)
Hemoglobin: 11.3 g/dL (ref 11.1–15.9)
Immature Grans (Abs): 0 10*3/uL (ref 0.0–0.1)
Immature Granulocytes: 0 %
Lymphocytes Absolute: 1.5 10*3/uL (ref 0.7–3.1)
Lymphs: 13 %
MCH: 22.1 pg — ABNORMAL LOW (ref 26.6–33.0)
MCHC: 32.2 g/dL (ref 31.5–35.7)
MCV: 69 fL — ABNORMAL LOW (ref 79–97)
Monocytes Absolute: 0.5 10*3/uL (ref 0.1–0.9)
Monocytes: 4 %
Neutrophils Absolute: 9.3 10*3/uL — ABNORMAL HIGH (ref 1.4–7.0)
Neutrophils: 82 %
RBC: 5.11 x10E6/uL (ref 3.77–5.28)
RDW: 16.5 % — ABNORMAL HIGH (ref 11.7–15.4)
WBC: 11.4 10*3/uL — ABNORMAL HIGH (ref 3.4–10.8)

## 2020-04-24 MED ORDER — ONDANSETRON 4 MG PO TBDP: 4.0000 mg | ORAL_TABLET | Freq: Four times a day (QID) | ORAL | 0 refills | Status: DC | PRN

## 2020-04-24 NOTE — Progress Notes (Signed)
  ROB-Pt present for routine prenatal care. Pt c/o of sharp pain in the lower abd area, vaginal pain, n/v, headaches and unable to eat anything for 2-3 days.

## 2020-04-24 NOTE — Patient Instructions (Addendum)
Bland Diet A bland diet consists of foods that are often soft and do not have a lot of fat, fiber, or extra seasonings. Foods without fat, fiber, or seasoning are easier for the body to digest. They are also less likely to irritate your mouth, throat, stomach, and other parts of your digestive system. A bland diet is sometimes called a BRAT diet. What is my plan? Your health care provider or food and nutrition specialist (dietitian) may recommend specific changes to your diet to prevent symptoms or to treat your symptoms. These changes may include:  Eating small meals often.  Cooking food until it is soft enough to chew easily.  Chewing your food well.  Drinking fluids slowly.  Not eating foods that are very spicy, sour, or fatty.  Not eating citrus fruits, such as oranges and grapefruit. What do I need to know about this diet?  Eat a variety of foods from the bland diet food list.  Do not follow a bland diet longer than needed.  Ask your health care provider whether you should take vitamins or supplements. What foods can I eat? Grains  Hot cereals, such as cream of wheat. Rice. Bread, crackers, or tortillas made from refined white flour. Vegetables Canned or cooked vegetables. Mashed or boiled potatoes. Fruits  Bananas. Applesauce. Other types of cooked or canned fruit with the skin and seeds removed, such as canned peaches or pears. Meats and other proteins  Scrambled eggs. Creamy peanut butter or other nut butters. Lean, well-cooked meats, such as chicken or fish. Tofu. Soups or broths. Dairy Low-fat dairy products, such as milk, cottage cheese, or yogurt. Beverages  Water. Herbal tea. Apple juice. Fats and oils Mild salad dressings. Canola or olive oil. Sweets and desserts Pudding. Custard. Fruit gelatin. Ice cream. The items listed above may not be a complete list of recommended foods and beverages. Contact a dietitian for more options. What foods are not  recommended? Grains Whole grain breads and cereals. Vegetables Raw vegetables. Fruits Raw fruits, especially citrus, berries, or dried fruits. Dairy Whole fat dairy foods. Beverages Caffeinated drinks. Alcohol. Seasonings and condiments Strongly flavored seasonings or condiments. Hot sauce. Salsa. Other foods Spicy foods. Fried foods. Sour foods, such as pickled or fermented foods. Foods with high sugar content. Foods high in fiber. The items listed above may not be a complete list of foods and beverages to avoid. Contact a dietitian for more information. Summary  A bland diet consists of foods that are often soft and do not have a lot of fat, fiber, or extra seasonings.  Foods without fat, fiber, or seasoning are easier for the body to digest.  Check with your health care provider to see how long you should follow this diet plan. It is not meant to be followed for long periods. This information is not intended to replace advice given to you by your health care provider. Make sure you discuss any questions you have with your health care provider. Document Revised: 11/08/2017 Document Reviewed: 11/08/2017 Elsevier Patient Education  [place].    Common Medications Safe in Pregnancy  Acne:      Constipation:  Benzoyl Peroxide     Colace  Clindamycin      Dulcolax Suppository  Topica Erythromycin     Fibercon  Salicylic Acid      Metamucil         Miralax AVOID:        Senakot   Accutane    Cough:  Retin-A  Cough Drops  Tetracycline      Phenergan w/ Codeine if Rx  Minocycline      Robitussin (Plain & DM)  Antibiotics:     Crabs/Lice:  Ceclor       RID  Cephalosporins    AVOID:  E-Mycins      Kwell  Keflex  Macrobid/Macrodantin   Diarrhea:  Penicillin      Kao-Pectate  Zithromax      Imodium AD         PUSH FLUIDS AVOID:       Cipro     Fever:  Tetracycline      Tylenol (Regular or Extra  Minocycline       Strength)  Levaquin      Extra Strength-Do  not          Exceed 8 tabs/24 hrs Caffeine:        <232m/day (equiv. To 1 cup of coffee or  approx. 3 12 oz sodas)         Gas: Cold/Hayfever:       Gas-X  Benadryl      Mylicon  Claritin       Phazyme  **Claritin-D        Chlor-Trimeton    Headaches:  Dimetapp      ASA-Free Excedrin  Drixoral-Non-Drowsy     Cold Compress  Mucinex (Guaifenasin)     Tylenol (Regular or Extra  Sudafed/Sudafed-12 Hour     Strength)  **Sudafed PE Pseudoephedrine   Tylenol Cold & Sinus     Vicks Vapor Rub  Zyrtec  **AVOID if Problems With Blood Pressure         Heartburn: Avoid lying down for at least 1 hour after meals  Aciphex      Maalox     Rash:  Milk of Magnesia     Benadryl    Mylanta       1% Hydrocortisone Cream  Pepcid  Pepcid Complete   Sleep Aids:  Prevacid      Ambien   Prilosec       Benadryl  Rolaids       Chamomile Tea  Tums (Limit 4/day)     Unisom  Zantac       Tylenol PM         Warm milk-add vanilla or  Hemorrhoids:       Sugar for taste  Anusol/Anusol H.C.  (RX: Analapram 2.5%)  Sugar Substitutes:  Hydrocortisone OTC     Ok in moderation  Preparation H      Tucks        Vaseline lotion applied to tissue with wiping    Herpes:     Throat:  Acyclovir      Oragel  Famvir  Valtrex     Vaccines:         Flu Shot Leg Cramps:       *Gardasil  Benadryl      Hepatitis A         Hepatitis B Nasal Spray:       Pneumovax  Saline Nasal Spray     Polio Booster         Tetanus Nausea:       Tuberculosis test or PPD  Vitamin B6 25 mg TID   AVOID:    Dramamine      *Gardasil  Emetrol       Live Poliovirus  Ginger Root 250 mg QID    MMR (measles, mumps &  High Complex Carbs @ Bedtime    rebella)  Sea Bands-Accupressure    Varicella (Chickenpox)  Unisom 1/2 tab TID     *No known complications           If received before Pain:         Known pregnancy;   Darvocet       Resume series  after  Lortab        Delivery  Percocet    Yeast:   Tramadol      Femstat  Tylenol 3      Gyne-lotrimin  Ultram       Monistat  Vicodin           MISC:         All Sunscreens           Hair Coloring/highlights          Insect Repellant's          (Including DEET)         Mystic Tans        Breastfeeding  Choosing to breastfeed is one of the best decisions you can make for yourself and your baby. A change in hormones during pregnancy causes your breasts to make breast milk in your milk-producing glands. Hormones prevent breast milk from being released before your baby is born. They also prompt milk flow after birth. Once breastfeeding has begun, thoughts of your baby, as well as his or her sucking or crying, can stimulate the release of milk from your milk-producing glands. Benefits of breastfeeding Research shows that breastfeeding offers many health benefits for infants and mothers. It also offers a cost-free and convenient way to feed your baby. For your baby  Your first milk (colostrum) helps your baby's digestive system to function better.  Special cells in your milk (antibodies) help your baby to fight off infections.  Breastfed babies are less likely to develop asthma, allergies, obesity, or type 2 diabetes. They are also at lower risk for sudden infant death syndrome (SIDS).  Nutrients in breast milk are better able to meet your baby's needs compared to infant formula.  Breast milk improves your baby's brain development. For you  Breastfeeding helps to create a very special bond between you and your baby.  Breastfeeding is convenient. Breast milk costs nothing and is always available at the correct temperature.  Breastfeeding helps to burn calories. It helps you to lose the weight that you gained during pregnancy.  Breastfeeding makes your uterus return faster to its size before pregnancy. It also slows bleeding (lochia) after you give birth.  Breastfeeding helps  to lower your risk of developing type 2 diabetes, osteoporosis, rheumatoid arthritis, cardiovascular disease, and breast, ovarian, uterine, and endometrial cancer later in life. Breastfeeding basics Starting breastfeeding  Find a comfortable place to sit or lie down, with your neck and back well-supported.  Place a pillow or a rolled-up blanket under your baby to bring him or her to the level of your breast (if you are seated). Nursing pillows are specially designed to help support your arms and your baby while you breastfeed.  Make sure that your baby's tummy (abdomen) is facing your abdomen.  Gently massage your breast. With your fingertips, massage from the outer edges of your breast inward toward the nipple. This encourages milk flow. If your milk flows slowly, you may need to continue this action during the feeding.  Support your breast with 4 fingers underneath and your thumb above your nipple (  make the letter "C" with your hand). Make sure your fingers are well away from your nipple and your baby's mouth.  Stroke your baby's lips gently with your finger or nipple.  When your baby's mouth is open wide enough, quickly bring your baby to your breast, placing your entire nipple and as much of the areola as possible into your baby's mouth. The areola is the colored area around your nipple. ? More areola should be visible above your baby's upper lip than below the lower lip. ? Your baby's lips should be opened and extended outward (flanged) to ensure an adequate, comfortable latch. ? Your baby's tongue should be between his or her lower gum and your breast.  Make sure that your baby's mouth is correctly positioned around your nipple (latched). Your baby's lips should create a seal on your breast and be turned out (everted).  It is common for your baby to suck about 2-3 minutes in order to start the flow of breast milk. Latching Teaching your baby how to latch onto your breast properly is  very important. An improper latch can cause nipple pain, decreased milk supply, and poor weight gain in your baby. Also, if your baby is not latched onto your nipple properly, he or she may swallow some air during feeding. This can make your baby fussy. Burping your baby when you switch breasts during the feeding can help to get rid of the air. However, teaching your baby to latch on properly is still the best way to prevent fussiness from swallowing air while breastfeeding. Signs that your baby has successfully latched onto your nipple  Silent tugging or silent sucking, without causing you pain. Infant's lips should be extended outward (flanged).  Swallowing heard between every 3-4 sucks once your milk has started to flow (after your let-down milk reflex occurs).  Muscle movement above and in front of his or her ears while sucking. Signs that your baby has not successfully latched onto your nipple  Sucking sounds or smacking sounds from your baby while breastfeeding.  Nipple pain. If you think your baby has not latched on correctly, slip your finger into the corner of your baby's mouth to break the suction and place it between your baby's gums. Attempt to start breastfeeding again. Signs of successful breastfeeding Signs from your baby  Your baby will gradually decrease the number of sucks or will completely stop sucking.  Your baby will fall asleep.  Your baby's body will relax.  Your baby will retain a small amount of milk in his or her mouth.  Your baby will let go of your breast by himself or herself. Signs from you  Breasts that have increased in firmness, weight, and size 1-3 hours after feeding.  Breasts that are softer immediately after breastfeeding.  Increased milk volume, as well as a change in milk consistency and color by the fifth day of breastfeeding.  Nipples that are not sore, cracked, or bleeding. Signs that your baby is getting enough milk  Wetting at least  1-2 diapers during the first 24 hours after birth.  Wetting at least 5-6 diapers every 24 hours for the first week after birth. The urine should be clear or pale yellow by the age of 5 days.  Wetting 6-8 diapers every 24 hours as your baby continues to grow and develop.  At least 3 stools in a 24-hour period by the age of 5 days. The stool should be soft and yellow.  At least 3 stools  in a 24-hour period by the age of 7 days. The stool should be seedy and yellow.  No loss of weight greater than 10% of birth weight during the first 3 days of life.  Average weight gain of 4-7 oz (113-198 g) per week after the age of 4 days.  Consistent daily weight gain by the age of 5 days, without weight loss after the age of 2 weeks. After a feeding, your baby may spit up a small amount of milk. This is normal. Breastfeeding frequency and duration Frequent feeding will help you make more milk and can prevent sore nipples and extremely full breasts (breast engorgement). Breastfeed when you feel the need to reduce the fullness of your breasts or when your baby shows signs of hunger. This is called "breastfeeding on demand." Signs that your baby is hungry include:  Increased alertness, activity, or restlessness.  Movement of the head from side to side.  Opening of the mouth when the corner of the mouth or cheek is stroked (rooting).  Increased sucking sounds, smacking lips, cooing, sighing, or squeaking.  Hand-to-mouth movements and sucking on fingers or hands.  Fussing or crying. Avoid introducing a pacifier to your baby in the first 4-6 weeks after your baby is born. After this time, you may choose to use a pacifier. Research has shown that pacifier use during the first year of a baby's life decreases the risk of sudden infant death syndrome (SIDS). Allow your baby to feed on each breast as long as he or she wants. When your baby unlatches or falls asleep while feeding from the first breast, offer the  second breast. Because newborns are often sleepy in the first few weeks of life, you may need to awaken your baby to get him or her to feed. Breastfeeding times will vary from baby to baby. However, the following rules can serve as a guide to help you make sure that your baby is properly fed:  Newborns (babies 36 weeks of age or younger) may breastfeed every 1-3 hours.  Newborns should not go without breastfeeding for longer than 3 hours during the day or 5 hours during the night.  You should breastfeed your baby a minimum of 8 times in a 24-hour period. Breast milk pumping     Pumping and storing breast milk allows you to make sure that your baby is exclusively fed your breast milk, even at times when you are unable to breastfeed. This is especially important if you go back to work while you are still breastfeeding, or if you are not able to be present during feedings. Your lactation consultant can help you find a method of pumping that works best for you and give you guidelines about how long it is safe to store breast milk. Caring for your breasts while you breastfeed Nipples can become dry, cracked, and sore while breastfeeding. The following recommendations can help keep your breasts moisturized and healthy:  Avoid using soap on your nipples.  Wear a supportive bra designed especially for nursing. Avoid wearing underwire-style bras or extremely tight bras (sports bras).  Air-dry your nipples for 3-4 minutes after each feeding.  Use only cotton bra pads to absorb leaked breast milk. Leaking of breast milk between feedings is normal.  Use lanolin on your nipples after breastfeeding. Lanolin helps to maintain your skin's normal moisture barrier. Pure lanolin is not harmful (not toxic) to your baby. You may also hand express a few drops of breast milk and gently massage  that milk into your nipples and allow the milk to air-dry. In the first few weeks after giving birth, some women experience  breast engorgement. Engorgement can make your breasts feel heavy, warm, and tender to the touch. Engorgement peaks within 3-5 days after you give birth. The following recommendations can help to ease engorgement:  Completely empty your breasts while breastfeeding or pumping. You may want to start by applying warm, moist heat (in the shower or with warm, water-soaked hand towels) just before feeding or pumping. This increases circulation and helps the milk flow. If your baby does not completely empty your breasts while breastfeeding, pump any extra milk after he or she is finished.  Apply ice packs to your breasts immediately after breastfeeding or pumping, unless this is too uncomfortable for you. To do this: ? Put ice in a plastic bag. ? Place a towel between your skin and the bag. ? Leave the ice on for 20 minutes, 2-3 times a day.  Make sure that your baby is latched on and positioned properly while breastfeeding. If engorgement persists after 48 hours of following these recommendations, contact your health care provider or a Science writer. Overall health care recommendations while breastfeeding  Eat 3 healthy meals and 3 snacks every day. Well-nourished mothers who are breastfeeding need an additional 450-500 calories a day. You can meet this requirement by increasing the amount of a balanced diet that you eat.  Drink enough water to keep your urine pale yellow or clear.  Rest often, relax, and continue to take your prenatal vitamins to prevent fatigue, stress, and low vitamin and mineral levels in your body (nutrient deficiencies).  Do not use any products that contain nicotine or tobacco, such as cigarettes and e-cigarettes. Your baby may be harmed by chemicals from cigarettes that pass into breast milk and exposure to secondhand smoke. If you need help quitting, ask your health care provider.  Avoid alcohol.  Do not use illegal drugs or marijuana.  Talk with your health care  provider before taking any medicines. These include over-the-counter and prescription medicines as well as vitamins and herbal supplements. Some medicines that may be harmful to your baby can pass through breast milk.  It is possible to become pregnant while breastfeeding. If birth control is desired, ask your health care provider about options that will be safe while breastfeeding your baby. Where to find more information: [employer]: www.llli.org Contact a health care provider if:  You feel like you want to stop breastfeeding or have become frustrated with breastfeeding.  Your nipples are cracked or bleeding.  Your breasts are red, tender, or warm.  You have: ? Painful breasts or nipples. ? A swollen area on either breast. ? A fever or chills. ? Nausea or vomiting. ? Drainage other than breast milk from your nipples.  Your breasts do not become full before feedings by the fifth day after you give birth.  You feel sad and depressed.  Your baby is: ? Too sleepy to eat well. ? Having trouble sleeping. ? More than 47 week old and wetting fewer than 6 diapers in a 24-hour period. ? Not gaining weight by 66 days of age.  Your baby has fewer than 3 stools in a 24-hour period.  Your baby's skin or the white parts of his or her eyes become yellow. Get help right away if:  Your baby is overly tired (lethargic) and does not want to wake up and feed.  Your baby develops  an unexplained fever. Summary  Breastfeeding offers many health benefits for infant and mothers.  Try to breastfeed your infant when he or she shows early signs of hunger.  Gently tickle or stroke your baby's lips with your finger or nipple to allow the baby to open his or her mouth. Bring the baby to your breast. Make sure that much of the areola is in your baby's mouth. Offer one side and burp the baby before you offer the other side.  Talk with your health care provider or lactation consultant  if you have questions or you face problems as you breastfeed. This information is not intended to replace advice given to you by your health care provider. Make sure you discuss any questions you have with your health care provider. Document Revised: 01/04/2018 Document Reviewed: 11/11/2016 Elsevier Patient Education  [place].

## 2020-04-24 NOTE — Progress Notes (Signed)
ROB: Patient notes significant nausea/vomiting over the past 2-3 days, can't keep anything down. Also noting abdominal/pelvic pain. Has chills, but denies fevers, bowel changes. Denies vaginal discharge, dysuria, or vaginal bleeding. Has not eaten anything out of the ordinary lately.  UA today with large ketones large leukocytes but no nitrates. Will send culture. Will prescribe Zofran, advised on BRAT diet. If no improvement, or pain worsens, can proceed to ER for further evaluation.

## 2020-04-30 LAB — URINE CULTURE

## 2020-05-02 ENCOUNTER — Other Ambulatory Visit: Payer: Self-pay

## 2020-05-02 ENCOUNTER — Inpatient Hospital Stay (HOSPITAL_COMMUNITY)
Admission: AD | Admit: 2020-05-02 | Discharge: 2020-05-02 | Disposition: A | Discharge status: Home / Self Care | Payer: BC Managed Care – PPO | Attending: Obstetrics & Gynecology | Admitting: Obstetrics & Gynecology

## 2020-05-02 ENCOUNTER — Encounter (HOSPITAL_COMMUNITY): Payer: Self-pay | Admitting: Obstetrics & Gynecology

## 2020-05-02 DIAGNOSIS — A599 Trichomoniasis, unspecified: Secondary | ICD-10-CM | POA: Diagnosis not present

## 2020-05-02 DIAGNOSIS — Z79899 Other long term (current) drug therapy: Secondary | ICD-10-CM | POA: Insufficient documentation

## 2020-05-02 DIAGNOSIS — Z3A17 17 weeks gestation of pregnancy: Secondary | ICD-10-CM | POA: Diagnosis not present

## 2020-05-02 DIAGNOSIS — M549 Dorsalgia, unspecified: Secondary | ICD-10-CM | POA: Insufficient documentation

## 2020-05-02 DIAGNOSIS — O98312 Other infections with a predominantly sexual mode of transmission complicating pregnancy, second trimester: Secondary | ICD-10-CM

## 2020-05-02 DIAGNOSIS — O99891 Other specified diseases and conditions complicating pregnancy: Secondary | ICD-10-CM | POA: Insufficient documentation

## 2020-05-02 DIAGNOSIS — O2342 Unspecified infection of urinary tract in pregnancy, second trimester: Secondary | ICD-10-CM | POA: Insufficient documentation

## 2020-05-02 DIAGNOSIS — O26892 Other specified pregnancy related conditions, second trimester: Secondary | ICD-10-CM

## 2020-05-02 LAB — URINALYSIS, ROUTINE W REFLEX MICROSCOPIC
Bilirubin Urine: NEGATIVE
Glucose, UA: NEGATIVE mg/dL
Hgb urine dipstick: NEGATIVE
Ketones, ur: NEGATIVE mg/dL
Nitrite: NEGATIVE
Protein, ur: 30 mg/dL — AB
Specific Gravity, Urine: 1.026 (ref 1.005–1.030)
WBC, UA: >50 WBC/hpf — ABNORMAL HIGH (ref 0–5)
pH: 6 (ref 5.0–8.0)

## 2020-05-02 LAB — CBC WITH DIFFERENTIAL/PLATELET
Abs Immature Granulocytes: 0.05 10*3/uL (ref 0.00–0.07)
Basophils Absolute: 0 10*3/uL (ref 0.0–0.1)
Basophils Relative: 0 %
Eosinophils Absolute: 0.2 10*3/uL (ref 0.0–0.5)
Eosinophils Relative: 2 %
HCT: 33.7 % — ABNORMAL LOW (ref 36.0–46.0)
Hemoglobin: 10.2 g/dL — ABNORMAL LOW (ref 12.0–15.0)
Immature Granulocytes: 0 %
Lymphocytes Relative: 16 %
Lymphs Abs: 1.8 10*3/uL (ref 0.7–4.0)
MCH: 21.4 pg — ABNORMAL LOW (ref 26.0–34.0)
MCHC: 30.3 g/dL (ref 30.0–36.0)
MCV: 70.8 fL — ABNORMAL LOW (ref 80.0–100.0)
Monocytes Absolute: 0.5 10*3/uL (ref 0.1–1.0)
Monocytes Relative: 4 %
Neutro Abs: 9.2 10*3/uL — ABNORMAL HIGH (ref 1.7–7.7)
Neutrophils Relative %: 78 %
Platelets: 284 10*3/uL (ref 150–400)
RBC: 4.76 MIL/uL (ref 3.87–5.11)
RDW: 16.1 % — ABNORMAL HIGH (ref 11.5–15.5)
WBC: 11.8 10*3/uL — ABNORMAL HIGH (ref 4.0–10.5)
nRBC: 0 % (ref 0.0–0.2)

## 2020-05-02 LAB — OB RESULTS CONSOLE GBS: GBS: NEGATIVE

## 2020-05-02 MED ORDER — CEFPODOXIME PROXETIL 200 MG PO TABS: 200.0000 mg | ORAL_TABLET | Freq: Two times a day (BID) | ORAL | 0 refills | Status: DC

## 2020-05-02 MED ORDER — METRONIDAZOLE 500 MG PO TABS
2000.0000 mg | ORAL_TABLET | Freq: Once | ORAL | Status: AC
  Administered 2020-05-02: 2000 mg via ORAL
  Filled 2020-05-02: qty 4

## 2020-05-02 MED ORDER — LACTATED RINGERS IV SOLN: INTRAVENOUS | Status: DC

## 2020-05-02 MED ORDER — IBUPROFEN 600 MG PO TABS
600.0000 mg | ORAL_TABLET | Freq: Once | ORAL | Status: AC
  Administered 2020-05-02: 600 mg via ORAL
  Filled 2020-05-02: qty 1

## 2020-05-02 NOTE — MAU Provider Note (Signed)
History     CSN: [id number]  Arrival date and time: 05/02/20 2009   First Provider Initiated Contact with Patient 05/02/20 2057      Chief Complaint  Patient presents with   Abdominal Pain   Back Pain   Headache   HPI   Laura Hernandez is a 29 y.o. female G2P0010 @ [redacted]w[redacted]d here in MAU with complaints of abdominal pain, lower back pain and headache. Symptoms started this morning. The Pain comes and goes. She feels like her body is shutting down. She is having nausea and feels like she cannot eat like she should. She feels dehydrated. The pain has come and gone over the last week. Yesterday the pain returned and worsened this morning. She is not taking anything for the pain. + nausea. She was diagnosed with UTI and trichomas in the first trimester, however was never given medication to treat.   OB History    Gravida  2   Para      Term      Preterm      AB  1   Living        SAB  1   TAB      Ectopic      Multiple      Live Births              Past Medical History:  Diagnosis Date   Bronchitis    Chlamydia    Migraines 2012   Trichomonas     Past Surgical History:  Procedure Laterality Date   NO PAST SURGERIES      Family History  Problem Relation Age of Onset   Asthma Mother    Arthritis Mother    Diabetes Mother    Heart disease Mother    Hypertension Mother    Stroke Mother    Kidney disease Mother    Vision loss Mother    Diabetes Maternal Grandmother    Hypertension Maternal Grandmother     Social History   Tobacco Use   Smoking status: Never Smoker   Smokeless tobacco: Never Used  Vaping Use   Vaping Use: Never used  Substance Use Topics   Alcohol use: No    Comment: socially   Drug use: No    Allergies:  Allergies  Allergen Reactions   Latex Itching and Rash    Medications Prior to Admission  Medication Sig Dispense Refill Last Dose   ondansetron (ZOFRAN ODT) 4 MG disintegrating tablet Take 1  tablet (4 mg total) by mouth every 6 (six) hours as needed for nausea. 20 tablet 0 Past Week at Unknown time   Prenatal Vit-Fe Fumarate-FA (PRENATAL VITAMIN PO) Take by mouth.   Past Week at Unknown time   acetaminophen (TYLENOL) 500 MG tablet Take 1,000 mg by mouth 2 (two) times daily as needed for pain.    More than a month at Unknown time   albuterol (PROVENTIL HFA;VENTOLIN HFA) 108 (90 Base) MCG/ACT inhaler Inhale 1-2 puffs into the lungs every 6 (six) hours as needed for wheezing or shortness of breath. 1 Inhaler 0 More than a month at Unknown time   diphenhydrAMINE (BENADRYL) 25 MG tablet Take 25 mg by mouth every 6 (six) hours as needed for allergies.   More than a month at Unknown time   Results for orders placed or performed during the hospital encounter of 05/02/20 (from the past 48 hour(s))  Urinalysis, Routine w reflex microscopic     Status: Abnormal  Collection Time: 05/02/20  8:43 PM  Result Value Ref Range   Color, Urine YELLOW YELLOW   APPearance CLOUDY (A) CLEAR   Specific Gravity, Urine 1.026 1.005 - 1.030   pH 6.0 5.0 - 8.0   Glucose, UA NEGATIVE NEGATIVE mg/dL   Hgb urine dipstick NEGATIVE NEGATIVE   Bilirubin Urine NEGATIVE NEGATIVE   Ketones, ur NEGATIVE NEGATIVE mg/dL   Protein, ur 30 (A) NEGATIVE mg/dL   Nitrite NEGATIVE NEGATIVE   Leukocytes,Ua LARGE (A) NEGATIVE   RBC / HPF 11-20 0 - 5 RBC/hpf   WBC, UA >50 (H) 0 - 5 WBC/hpf   Bacteria, UA FEW (A) NONE SEEN   Squamous Epithelial / LPF 11-20 0 - 5   Mucus PRESENT    Trichomonas, UA PRESENT (A) NONE SEEN    Comment: Performed at [facility], [address] [place], [place] [address]  CBC with Differential/Platelet     Status: Abnormal   Collection Time: 05/02/20  9:19 PM  Result Value Ref Range   WBC 11.8 (H) 4.0 - 10.5 K/uL   RBC 4.76 3.87 - 5.11 MIL/uL   Hemoglobin 10.2 (L) 12.0 - 15.0 g/dL   HCT 46.9 (L) 36 - 46 %   MCV 70.8 (L) 80.0 - 100.0 fL   MCH 21.4 (L) 26.0 - 34.0 pg   MCHC 30.3  30.0 - 36.0 g/dL   RDW 62.9 (H) 52.8 - 41.3 %   Platelets 284 150 - 400 K/uL   nRBC 0.0 0.0 - 0.2 %   Neutrophils Relative % 78 %   Neutro Abs 9.2 (H) 1.7 - 7.7 K/uL   Lymphocytes Relative 16 %   Lymphs Abs 1.8 0.7 - 4.0 K/uL   Monocytes Relative 4 %   Monocytes Absolute 0.5 0 - 1 K/uL   Eosinophils Relative 2 %   Eosinophils Absolute 0.2 0 - 0 K/uL   Basophils Relative 0 %   Basophils Absolute 0.0 0 - 0 K/uL   Immature Granulocytes 0 %   Abs Immature Granulocytes 0.05 0.00 - 0.07 K/uL    Comment: Performed at [facility], [address], [place], [place] [address]   Review of Systems  Gastrointestinal: Positive for abdominal pain.   Physical Exam   Blood pressure 113/63, pulse 90, temperature 99 F (37.2 C), temperature source Oral, resp. rate 16, height 5' (1.524 m), weight 85.3 kg, last menstrual period 12/31/2019, SpO2 100 %, unknown if currently breastfeeding.  Physical Exam Constitutional:      Appearance: She is well-developed.  HENT:     Head: Normocephalic.  Abdominal:     Palpations: Abdomen is soft.  Genitourinary:    Adnexa: Right adnexa normal and left adnexa normal.     Comments: Cervix closed, thick, posterior.  Neurological:     Mental Status: She is alert.    MAU Course  Procedures None  MDM  + fetal heart tones via doppler  LR bolus X 1 Ibuprofen 600 mg PO X 1 Flagyl 2 grams given PO Urine culture sent.   Assessment and Plan   A:  1. Trichomonas infection   2. UTI in pregnancy, antepartum, second trimester   3. Back pain affecting pregnancy in second trimester   4. [redacted] weeks gestation of pregnancy      P:  Discharge home in stable condition Return to MAU if symptoms worsen Partner needs treatment.  Rx: Vantin  Urine culture    Zameria Vogl, Harolyn Rutherford, NP 05/04/2020 1:22 PM

## 2020-05-02 NOTE — Discharge Instructions (Signed)
Pyelonephritis During Pregnancy ° °What are the causes? °This condition is caused by a bacterial infection in the lower urinary tract that spreads to the kidney. °What increases the risk? °You are more likely to develop this condition if: °· You have diabetes. °· You have a history of frequent urinary tract infections. °What are the signs or symptoms? °Symptoms of this condition may begin with symptoms of a lower urinary tract infection. These may include: °· A frequent urge to pass urine. °· Burning pain when passing urine. °· Pain and pressure in your lower abdomen. °· Blood in your urine. °· Cloudy or smelly urine. °As the infection spreads to your kidney, you may have these symptoms: °· Fever. °· Chills. °· Pain and tenderness in your upper abdomen or in your back and sides (flank pain). Flank pain often affects one side of the body, usually the right side. °· Nausea, vomiting, or loss of appetite. °How is this diagnosed? °This condition may be diagnosed based on: °· Your symptoms and medical history. °· A physical exam. °· Tests to confirm the diagnosis. These may include: °? Blood tests to check kidney function and look for signs of infection. °? Urine tests to check for signs of infection, including bacteria, white or red blood cells, and protein. °? Tests to grow and identify the type of bacteria that is causing the infection (urine culture). °? Imaging studies of your kidneys to learn more about your condition. °How is this treated? °This condition is treated in the hospital with antibiotics that are given into one of your veins through an IV. Your health care provider: °· Will start you on an antibiotic that is effective against common urinary tract infections. °· May switch to another antibiotic if the results of your urine culture show that your infection is caused by different bacteria. °· Will be careful to choose antibiotics that are the safest during pregnancy. °Other treatments may include: °· IV  fluids if you are nauseous and not able to drink fluids. °· Pain and fever medicines. °· Medicines for nausea and vomiting. °You will be able to go home when your infection is under control. To prevent another infection, you may need to continue taking antibiotics by mouth until your baby is born. °Follow these instructions at home: °Medicines ° °· Take over-the-counter and prescription medicines only as told by your health care provider. °· Take your antibiotic medicine as told by your health care provider. Do not stop taking the antibiotic even if you start to feel better. °· Continue to take your prenatal vitamins. °Lifestyle ° °· Follow instructions from your health care provider about eating or drinking restrictions. You may need to avoid: °? Foods and drinks with added sugar. °? Caffeine and fruit juice. °· Drink enough fluid to keep your urine pale yellow. °· Go to the bathroom frequently. Do not hold your urine. Try to empty your bladder completely. °· Change your underwear every day. Wear all-cotton underwear. Do not wear tight underwear or pants. °General instructions ° °· Take these steps to lower the risk of bacteria getting into your urinary tract: °? Use liquid soap instead of bar soap when showering or bathing. Bacteria can grow on bar soap. °? When you wash yourself, clean the urethra opening first. Use a washcloth to clean the area between your vagina and anus. Pat the area dry with a clean towel. °? Wash your hands before and after you go to the bathroom. °? Wipe yourself from front to back   after going to the bathroom. °? Do not use douches, perfumed soap, creams, or powders. °? Do not soak in a bath for more than 30 minutes. °· Return to your normal activities as told by your health care provider. Ask your health care provider what activities are safe for you. °· Keep all follow-up visits as told by your health care provider. This is important. °Contact a health care provider if: °· You have  chills or a fever. °· You have any symptoms of infection that do not get better at home. °· Symptoms of infection come back. °· You have a reaction or side effects from your antibiotic. °Get help right away if: °· You start having contractions. °Summary °· Pyelonephritis is an infection of the kidney or kidneys. °· This condition results when a bacterial infection in the lower urinary tract spreads to the kidney. °· Lower urinary tract infections are common during pregnancy. °· Pyelonephritis causes chills, a fever, flank pain, and nausea. °· Pyelonephritis is a serious infection that is usually treated in the hospital with IV antibiotics. °This information is not intended to replace advice given to you by your health care provider. Make sure you discuss any questions you have with your health care provider. °Document Revised: 02/01/2019 Document Reviewed: 01/11/2018 °Elsevier Patient Education © 2020 Elsevier Inc. ° °

## 2020-05-02 NOTE — MAU Note (Signed)
..  Laura Hernandez is a 29 y.o. at [redacted]w[redacted]d here in MAU reporting: Constant sharp lower abdominal pain that began yesterday morning. Pt also complains of intermittent stabbing back pain. As well as a throbbing headache that makes her vision blurry. Pt states she is "hungry" has nausea and can not eat. Pt states she has not felt baby move as much. Denies vaginal bleeding or LOF.   Onset of complaint: abdominal pain began last weekend and went away after office visit and came back yesterday. Every thing else began yesterday and worsened today.  Pain score: abdominal pain 9/10. Back pain 7/10. Headache 5/10 Vitals:   05/02/20 2025  BP: 113/63  Pulse: 90  Resp: 16  Temp: 99 F (37.2 C)  SpO2: 100%     FHT: Doppler 157 Lab orders placed from triage: UA

## 2020-05-04 ENCOUNTER — Telehealth: Payer: Self-pay | Admitting: Obstetrics and Gynecology

## 2020-05-04 LAB — CULTURE, OB URINE: Culture: 60000 — AB

## 2020-05-04 LAB — GC/CHLAMYDIA PROBE AMP (~~LOC~~) NOT AT ARMC
Chlamydia: NEGATIVE
Comment: NEGATIVE
Comment: NORMAL
Neisseria Gonorrhea: NEGATIVE

## 2020-05-04 NOTE — Telephone Encounter (Signed)
Spoke to pt concerning her call to the office. Pt stated that she found the work excuse form Dr. Valentino Saxon. Pt stated that she was still in a lot of pain and was on her way to the hospital.

## 2020-05-04 NOTE — Telephone Encounter (Signed)
Pt called in and asked if a work note can be sent through her mychart. She said she needs it today. She said she had a copy but she lost it. The pt said she also want to let the Dr. Ashley Mariner she is going back to the ED. Please advise

## 2020-05-19 ENCOUNTER — Telehealth: Payer: Self-pay | Admitting: Obstetrics and Gynecology

## 2020-05-19 NOTE — Telephone Encounter (Signed)
Pt called in and stated that she is at the dentist and her dentist needs a form of clearance faxed over. I put the pt on hold and I called back to JW to ask her what she needs and she stated that she is rooming pts and she will need the fax number. I told the pt that we need the fax number and that JW is rooming pts so it may be a wait. As I was tell the pt this  JW called to the front and told Tab to give me the dental treatment form. Then JW came up to the fron and looked over the form and said that you can fax that form to the dentist office.

## 2020-05-21 ENCOUNTER — Ambulatory Visit (INDEPENDENT_AMBULATORY_CARE_PROVIDER_SITE_OTHER): Payer: BC Managed Care – PPO

## 2020-05-21 ENCOUNTER — Ambulatory Visit (INDEPENDENT_AMBULATORY_CARE_PROVIDER_SITE_OTHER): Payer: BC Managed Care – PPO | Admitting: Obstetrics and Gynecology

## 2020-05-21 ENCOUNTER — Encounter: Payer: Self-pay | Admitting: Obstetrics and Gynecology

## 2020-05-21 ENCOUNTER — Telehealth: Payer: Self-pay | Admitting: Obstetrics and Gynecology

## 2020-05-21 VITALS — BP 105/73 | HR 83 | Wt 190.0 lb

## 2020-05-21 DIAGNOSIS — Z3482 Encounter for supervision of other normal pregnancy, second trimester: Secondary | ICD-10-CM | POA: Diagnosis not present

## 2020-05-21 DIAGNOSIS — Z3A20 20 weeks gestation of pregnancy: Secondary | ICD-10-CM

## 2020-05-21 LAB — POCT URINALYSIS DIPSTICK OB
Bilirubin, UA: NEGATIVE
Blood, UA: NEGATIVE
Glucose, UA: NEGATIVE
Leukocytes, UA: NEGATIVE
Nitrite, UA: NEGATIVE
POC,PROTEIN,UA: NEGATIVE
Spec Grav, UA: 1.015 (ref 1.010–1.025)
Urobilinogen, UA: 0.2 E.U./dL
pH, UA: 7 (ref 5.0–8.0)

## 2020-05-21 NOTE — Telephone Encounter (Signed)
Patient called in stating that she was aware she would have to make a payment on her OB Payment Plan. Patient stated she didn't have the money to pay today at her appointment, I asked the patient if she would be able to pay at least half- patient cannot. Patient explained she's been out of work recently and doesn't have the funds.

## 2020-05-21 NOTE — Progress Notes (Signed)
Patient comes in today for  ROB visit.  

## 2020-05-21 NOTE — Progress Notes (Signed)
ROB: FAS today-needs fetal spine.  We will schedule follow-up.  Gender surprise.  Patient still has nausea but is no longer vomiting.  Ketones and relation to diet and hydration stressed.

## 2020-06-01 ENCOUNTER — Telehealth: Payer: Self-pay | Admitting: Obstetrics and Gynecology

## 2020-06-01 NOTE — Telephone Encounter (Signed)
Patient walked into the building thinking she had an appointment for today informed her she had one on the 12th. While patient was in the office she requested I send a message to her provider to inform them that she has been having muscle spasms in her back, neck, and legs. She also stated that her job stated that her provider wasn't specific enough regarding her FMLA forms. Could you please advise?

## 2020-06-02 NOTE — Telephone Encounter (Signed)
I think that Dr. Valentino Saxon filled out these FMLA paperwork.

## 2020-06-03 ENCOUNTER — Other Ambulatory Visit: Payer: Self-pay | Admitting: Obstetrics and Gynecology

## 2020-06-03 DIAGNOSIS — Z09 Encounter for follow-up examination after completed treatment for conditions other than malignant neoplasm: Secondary | ICD-10-CM

## 2020-06-04 ENCOUNTER — Ambulatory Visit (INDEPENDENT_AMBULATORY_CARE_PROVIDER_SITE_OTHER): Payer: BC Managed Care – PPO

## 2020-06-04 DIAGNOSIS — Z3A22 22 weeks gestation of pregnancy: Secondary | ICD-10-CM

## 2020-06-04 DIAGNOSIS — Z09 Encounter for follow-up examination after completed treatment for conditions other than malignant neoplasm: Secondary | ICD-10-CM | POA: Diagnosis not present

## 2020-06-05 ENCOUNTER — Encounter: Payer: Self-pay | Admitting: Obstetrics and Gynecology

## 2020-06-05 ENCOUNTER — Ambulatory Visit (INDEPENDENT_AMBULATORY_CARE_PROVIDER_SITE_OTHER): Payer: BC Managed Care – PPO | Admitting: Obstetrics and Gynecology

## 2020-06-05 ENCOUNTER — Telehealth: Payer: Self-pay

## 2020-06-05 ENCOUNTER — Other Ambulatory Visit (HOSPITAL_COMMUNITY)
Admission: RE | Admit: 2020-06-05 | Discharge: 2020-06-05 | Disposition: A | Discharge status: Home / Self Care | Payer: BC Managed Care – PPO | Source: Ambulatory Visit | Attending: Obstetrics and Gynecology | Admitting: Obstetrics and Gynecology

## 2020-06-05 VITALS — BP 114/72 | HR 80 | Wt 195.1 lb

## 2020-06-05 DIAGNOSIS — B75 Trichinellosis: Secondary | ICD-10-CM | POA: Insufficient documentation

## 2020-06-05 DIAGNOSIS — O26892 Other specified pregnancy related conditions, second trimester: Secondary | ICD-10-CM | POA: Insufficient documentation

## 2020-06-05 DIAGNOSIS — O23592 Infection of other part of genital tract in pregnancy, second trimester: Secondary | ICD-10-CM | POA: Insufficient documentation

## 2020-06-05 DIAGNOSIS — Z3482 Encounter for supervision of other normal pregnancy, second trimester: Secondary | ICD-10-CM

## 2020-06-05 DIAGNOSIS — Z3A22 22 weeks gestation of pregnancy: Secondary | ICD-10-CM | POA: Insufficient documentation

## 2020-06-05 DIAGNOSIS — M791 Myalgia, unspecified site: Secondary | ICD-10-CM | POA: Diagnosis present

## 2020-06-05 DIAGNOSIS — O4692 Antepartum hemorrhage, unspecified, second trimester: Secondary | ICD-10-CM

## 2020-06-05 DIAGNOSIS — A599 Trichomoniasis, unspecified: Secondary | ICD-10-CM | POA: Diagnosis not present

## 2020-06-05 LAB — POCT URINALYSIS DIPSTICK OB
Bilirubin, UA: NEGATIVE
Blood, UA: NEGATIVE
Glucose, UA: NEGATIVE
Ketones, UA: NEGATIVE
Nitrite, UA: NEGATIVE
POC,PROTEIN,UA: NEGATIVE
Spec Grav, UA: 1.015 (ref 1.010–1.025)
Urobilinogen, UA: 0.2 E.U./dL
pH, UA: 7 (ref 5.0–8.0)

## 2020-06-05 MED ORDER — CYCLOBENZAPRINE HCL 10 MG PO TABS
10.0000 mg | ORAL_TABLET | Freq: Three times a day (TID) | ORAL | 1 refills | Status: DC | PRN
Start: 2020-06-05 — End: 2020-09-09

## 2020-06-05 NOTE — Telephone Encounter (Signed)
Please see another phone encounter.  

## 2020-06-05 NOTE — Progress Notes (Signed)
Problem OB Visit: Patient presents today for discussion of leave papers from her job.  Notes that she missed work due to pelvic pain (found out about trichomonas infection at a triage visit in [facility] 05/02/2020), then ended up with a severe rash and had to remain out for a few days due to discomfort in the vaginal area and has to wear hot clothing in a hot environment, and does lots of heavy lifting, and has to climb 3 flights of stairs several times per day (no elevators). Reports an episode of vaginal bleeding last week after work. Notes her job is not being accommodating, and really wants her to be written out of work. She states that she financially cannot afford to not work. Now noting muscle spasms in her neck and her back. Given prescription of Flexeril to use, also advised on use of warm compresses. Given work restrictions and completed FMLA for work (reduced hours, activities). Patient to self-swab for TOC for trichomonas infection. RTC for regularly scheduled visit in 2 weeks.

## 2020-06-05 NOTE — Telephone Encounter (Signed)
Pt called to discuss her FMLAforms and other pregnancy pains and issues. Pt made an appointment to see [facility] to discuss the issues.

## 2020-06-05 NOTE — Telephone Encounter (Signed)
This patient called in on 06/01/2020 and she has still not heard anything from her provider. Patient is needing the provider to be more specific with her FMLA paperwork as the original states that the patient can miss work and it not be penalized for appointments only however she is experiencing a lot of complications with her pregnancy that is causing her to have to call in sick to work. Patient has to return to work tomorrow and she stated if she doesn't get this FMLA paperwork more specific by tomorrow she is at the risk of being terminated. Patient also called in on 8/9 regarding some muscle spasms that have been causing her a lot of discomfort and that has not been addressed yet either. Patient works in a [employer] and works 12 hour shifts and states that in the conditions she works in it makes it hard to be able to do her job to the fullest with all this issues she has experienced during her pregnancy.   Could you please advise?

## 2020-06-05 NOTE — Patient Instructions (Signed)
Vaginal Bleeding During Pregnancy, Second Trimester  A small amount of bleeding (spotting) from the vagina is common during pregnancy. Sometimes the bleeding is normal and is not a sign of problems. In some other cases, it is a sign of something serious. Tell your doctor right away if there is any bleeding from your vagina. Follow these instructions at home: Activity  Follow your doctor's instructions about how active you can be.  If needed, make plans for someone to help with your normal activities.  Do not exercise or do activities that take a lot of effort until your doctor says that this is safe.  Do not lift anything that is heavier than 10 lb (4.5 kg) until your doctor says that this is safe.  Do not have sex or orgasms until your doctor says that this is safe. Medicines  Take over-the-counter and prescription medicines only as told by your doctor.  Do not take aspirin. It can cause bleeding. General instructions  Watch your condition for any changes.  Write down: ? The number of pads you use each day. ? How often you change pads. ? How soaked your pads are.  Do not use tampons.  Do not douche.  If you pass any tissue from your vagina, save it to show to your doctor.  Keep all follow-up visits as told by your doctor. This is important. Contact a doctor if:  You have bleeding in the vagina at any time during pregnancy.  You have cramps.  You have a fever that does not get better with medicine. Get help right away if:  You have very bad cramps in your back or belly (abdomen).  You have contractions.  You have chills.  You pass large clots or a lot of tissue from your vagina.  Your bleeding gets worse.  You feel light-headed.  You feel weak.  You pass out (faint).  You are leaking fluid from your vagina.  You have a gush of fluid from your vagina. Summary  Sometimes vaginal bleeding during pregnancy is normal and is not a problem. Sometimes it may  be a sign of something serious.  Tell your doctor about any bleeding from your vagina right away.  Follow your doctor's instructions about how active you can be. You may need someone to help you with your normal activities. This information is not intended to replace advice given to you by your health care provider. Make sure you discuss any questions you have with your health care provider. Document Revised: 01/29/2019 Document Reviewed: 01/11/2017 Elsevier Patient Education  2020 Elsevier Inc.  

## 2020-06-05 NOTE — Progress Notes (Signed)
ROB-Pt present for routine prenatal care. Pt c/o of vaginal bleeding on Sunday after work, no bleeding since then; pt stated that she was going up and down 2 flight of steps throughout her shift which she stated caused the bleeding. Pt also c/o muscle spasms in the neck, back and legs, sinus problems, fatigue, leg and feet swelling.

## 2020-06-09 LAB — CERVICOVAGINAL ANCILLARY ONLY
Chlamydia: NEGATIVE
Comment: NEGATIVE
Comment: NEGATIVE
Comment: NORMAL
Neisseria Gonorrhea: NEGATIVE
Trichomonas: POSITIVE — AB

## 2020-06-09 MED ORDER — METRONIDAZOLE 500 MG PO TABS: ORAL_TABLET | ORAL | 1 refills | Status: DC

## 2020-06-09 NOTE — Addendum Note (Signed)
Addended by: Fabian November on: 06/09/2020 09:03 PM   Modules accepted: Orders

## 2020-06-10 ENCOUNTER — Telehealth: Payer: Self-pay | Admitting: Obstetrics and Gynecology

## 2020-06-10 NOTE — Telephone Encounter (Signed)
Pt called in and stated that the pharmacy will not fill her med's for her partner the pt stated that they say that is illegal. The pt was wanting a refill on her metronidazole. The pt just wanted to let the provider know that the pharmacy will not refill the meds. I told the pt I will send a message to the nurse

## 2020-06-11 MED ORDER — METRONIDAZOLE 500 MG PO TABS: ORAL_TABLET | ORAL | 0 refills | Status: DC

## 2020-06-11 NOTE — Telephone Encounter (Signed)
Spoke to pt concerning her call to the office. Pt was informed that [facility] gave her two refills one for her and one for him. Pt stated that the pharmacy would not give it to him. I informed the pt that she would get the medication and give it to him. Pt stated that the pharmacy canceled her refill. Another refill send to her pharmacy for flagyl for her pt to be treated for trich.

## 2020-06-15 ENCOUNTER — Telehealth: Payer: Self-pay | Admitting: Obstetrics and Gynecology

## 2020-06-15 NOTE — Telephone Encounter (Signed)
Patient called in stating that her job is requesting more information regarding her FMLA paperwork- they will not accept it without more information provided. Patient also stating that the antibiotics she was prescribed are causing her to have a yeast infection. Could you please advise?

## 2020-06-16 NOTE — Patient Instructions (Addendum)
Second Trimester of Pregnancy  The second trimester is from week 14 through week 27 (month 4 through 6). This is often the time in pregnancy that you feel your best. Often times, morning sickness has lessened or quit. You may have more energy, and you may get hungry more often. Your unborn baby is growing rapidly. At the end of the sixth month, he or she is about 9 inches long and weighs about 1 pounds. You will likely feel the baby move between 18 and 20 weeks of pregnancy. Follow these instructions at home: Medicines  Take over-the-counter and prescription medicines only as told by your doctor. Some medicines are safe and some medicines are not safe during pregnancy.  Take a prenatal vitamin that contains at least 600 micrograms (mcg) of folic acid.  If you have trouble pooping (constipation), take medicine that will make your stool soft (stool softener) if your doctor approves. Eating and drinking   Eat regular, healthy meals.  Avoid raw meat and uncooked cheese.  If you get low calcium from the food you eat, talk to your doctor about taking a daily calcium supplement.  Avoid foods that are high in fat and sugars, such as fried and sweet foods.  If you feel sick to your stomach (nauseous) or throw up (vomit): ? Eat 4 or 5 small meals a day instead of 3 large meals. ? Try eating a few soda crackers. ? Drink liquids between meals instead of during meals.  To prevent constipation: ? Eat foods that are high in fiber, like fresh fruits and vegetables, whole grains, and beans. ? Drink enough fluids to keep your pee (urine) clear or pale yellow. Activity  Exercise only as told by your doctor. Stop exercising if you start to have cramps.  Do not exercise if it is too hot, too humid, or if you are in a place of great height (high altitude).  Avoid heavy lifting.  Wear low-heeled shoes. Sit and stand up straight.  You can continue to have sex unless your doctor tells you not  to. Relieving pain and discomfort  Wear a good support bra if your breasts are tender.  Take warm water baths (sitz baths) to soothe pain or discomfort caused by hemorrhoids. Use hemorrhoid cream if your doctor approves.  Rest with your legs raised if you have leg cramps or low back pain.  If you develop puffy, bulging veins (varicose veins) in your legs: ? Wear support hose or compression stockings as told by your doctor. ? Raise (elevate) your feet for 15 minutes, 3-4 times a day. ? Limit salt in your food. Prenatal care  Write down your questions. Take them to your prenatal visits.  Keep all your prenatal visits as told by your doctor. This is important. Safety  Wear your seat belt when driving.  Make a list of emergency phone numbers, including numbers for family, friends, the hospital, and police and fire departments. General instructions  Ask your doctor about the right foods to eat or for help finding a counselor, if you need these services.  Ask your doctor about local prenatal classes. Begin classes before month 6 of your pregnancy.  Do not use hot tubs, steam rooms, or saunas.  Do not douche or use tampons or scented sanitary pads.  Do not cross your legs for long periods of time.  Visit your dentist if you have not done so. Use a soft toothbrush to brush your teeth. Floss gently.  Avoid all smoking, herbs,  and alcohol. Avoid drugs that are not approved by your doctor.  Do not use any products that contain nicotine or tobacco, such as cigarettes and e-cigarettes. If you need help quitting, ask your doctor.  Avoid cat litter boxes and soil used by cats. These carry germs that can cause birth defects in the baby and can cause a loss of your baby (miscarriage) or stillbirth. Contact a doctor if:  You have mild cramps or pressure in your lower belly.  You have pain when you pee (urinate).  You have bad smelling fluid coming from your vagina.  You continue to  feel sick to your stomach (nauseous), throw up (vomit), or have watery poop (diarrhea).  You have a nagging pain in your belly area.  You feel dizzy. Get help right away if:  You have a fever.  You are leaking fluid from your vagina.  You have spotting or bleeding from your vagina.  You have severe belly cramping or pain.  You lose or gain weight rapidly.  You have trouble catching your breath and have chest pain.  You notice sudden or extreme puffiness (swelling) of your face, hands, ankles, feet, or legs.  You have not felt the baby move in over an hour.  You have severe headaches that do not go away when you take medicine.  You have trouble seeing. Summary  The second trimester is from week 14 through week 27 (months 4 through 6). This is often the time in pregnancy that you feel your best.  To take care of yourself and your unborn baby, you will need to eat healthy meals, take medicines only if your doctor tells you to do so, and do activities that are safe for you and your baby.  Call your doctor if you get sick or if you notice anything unusual about your pregnancy. Also, call your doctor if you need help with the right food to eat, or if you want to know what activities are safe for you. This information is not intended to replace advice given to you by your health care provider. Make sure you discuss any questions you have with your health care provider. Document Revised: 02/01/2019 Document Reviewed: 11/15/2016 Elsevier Patient Education  2020 [employer]    Breastfeeding  Choosing to breastfeed is one of the best decisions you can make for yourself and your baby. A change in hormones during pregnancy causes your breasts to make breast milk in your milk-producing glands. Hormones prevent breast milk from being released before your baby is born. They also prompt milk flow after birth. Once breastfeeding has begun, thoughts of your baby, as well as his or her  sucking or crying, can stimulate the release of milk from your milk-producing glands. Benefits of breastfeeding Research shows that breastfeeding offers many health benefits for infants and mothers. It also offers a cost-free and convenient way to feed your baby. For your baby  Your first milk (colostrum) helps your baby's digestive system to function better.  Special cells in your milk (antibodies) help your baby to fight off infections.  Breastfed babies are less likely to develop asthma, allergies, obesity, or type 2 diabetes. They are also at lower risk for sudden infant death syndrome (SIDS).  Nutrients in breast milk are better able to meet your baby's needs compared to infant formula.  Breast milk improves your baby's brain development. For you  Breastfeeding helps to create a very special bond between you and your baby.  Breastfeeding is convenient.   Breast milk costs nothing and is always available at the correct temperature.  Breastfeeding helps to burn calories. It helps you to lose the weight that you gained during pregnancy.  Breastfeeding makes your uterus return faster to its size before pregnancy. It also slows bleeding (lochia) after you give birth.  Breastfeeding helps to lower your risk of developing type 2 diabetes, osteoporosis, rheumatoid arthritis, cardiovascular disease, and breast, ovarian, uterine, and endometrial cancer later in life. Breastfeeding basics Starting breastfeeding  Find a comfortable place to sit or lie down, with your neck and back well-supported.  Place a pillow or a rolled-up blanket under your baby to bring him or her to the level of your breast (if you are seated). Nursing pillows are specially designed to help support your arms and your baby while you breastfeed.  Make sure that your baby's tummy (abdomen) is facing your abdomen.  Gently massage your breast. With your fingertips, massage from the outer edges of your breast inward toward  the nipple. This encourages milk flow. If your milk flows slowly, you may need to continue this action during the feeding.  Support your breast with 4 fingers underneath and your thumb above your nipple (make the letter "C" with your hand). Make sure your fingers are well away from your nipple and your baby's mouth.  Stroke your baby's lips gently with your finger or nipple.  When your baby's mouth is open wide enough, quickly bring your baby to your breast, placing your entire nipple and as much of the areola as possible into your baby's mouth. The areola is the colored area around your nipple. ? More areola should be visible above your baby's upper lip than below the lower lip. ? Your baby's lips should be opened and extended outward (flanged) to ensure an adequate, comfortable latch. ? Your baby's tongue should be between his or her lower gum and your breast.  Make sure that your baby's mouth is correctly positioned around your nipple (latched). Your baby's lips should create a seal on your breast and be turned out (everted).  It is common for your baby to suck about 2-3 minutes in order to start the flow of breast milk. Latching Teaching your baby how to latch onto your breast properly is very important. An improper latch can cause nipple pain, decreased milk supply, and poor weight gain in your baby. Also, if your baby is not latched onto your nipple properly, he or she may swallow some air during feeding. This can make your baby fussy. Burping your baby when you switch breasts during the feeding can help to get rid of the air. However, teaching your baby to latch on properly is still the best way to prevent fussiness from swallowing air while breastfeeding. Signs that your baby has successfully latched onto your nipple  Silent tugging or silent sucking, without causing you pain. Infant's lips should be extended outward (flanged).  Swallowing heard between every 3-4 sucks once your milk has  started to flow (after your let-down milk reflex occurs).  Muscle movement above and in front of his or her ears while sucking. Signs that your baby has not successfully latched onto your nipple  Sucking sounds or smacking sounds from your baby while breastfeeding.  Nipple pain. If you think your baby has not latched on correctly, slip your finger into the corner of your baby's mouth to break the suction and place it between your baby's gums. Attempt to start breastfeeding again. Signs of successful breastfeeding   baby  Your baby will gradually decrease the number of sucks or will completely stop sucking.  Your baby will fall asleep.  Your baby's body will relax.  Your baby will retain a small amount of milk in his or her mouth.  Your baby will let go of your breast by himself or herself. Signs from you  Breasts that have increased in firmness, weight, and size 1-3 hours after feeding.  Breasts that are softer immediately after breastfeeding.  Increased milk volume, as well as a change in milk consistency and color by the fifth day of breastfeeding.  Nipples that are not sore, cracked, or bleeding. Signs that your baby is getting enough milk  Wetting at least 1-2 diapers during the first 24 hours after birth.  Wetting at least 5-6 diapers every 24 hours for the first week after birth. The urine should be clear or pale yellow by the age of 5 days.  Wetting 6-8 diapers every 24 hours as your baby continues to grow and develop.  At least 3 stools in a 24-hour period by the age of 5 days. The stool should be soft and yellow.  At least 3 stools in a 24-hour period by the age of 7 days. The stool should be seedy and yellow.  No loss of weight greater than 10% of birth weight during the first 3 days of life.  Average weight gain of 4-7 oz (113-198 g) per week after the age of 4 days.  Consistent daily weight gain by the age of 5 days, without weight loss after the age of 2  weeks. After a feeding, your baby may spit up a small amount of milk. This is normal. Breastfeeding frequency and duration Frequent feeding will help you make more milk and can prevent sore nipples and extremely full breasts (breast engorgement). Breastfeed when you feel the need to reduce the fullness of your breasts or when your baby shows signs of hunger. This is called "breastfeeding on demand." Signs that your baby is hungry include:  Increased alertness, activity, or restlessness.  Movement of the head from side to side.  Opening of the mouth when the corner of the mouth or cheek is stroked (rooting).  Increased sucking sounds, smacking lips, cooing, sighing, or squeaking.  Hand-to-mouth movements and sucking on fingers or hands.  Fussing or crying. Avoid introducing a pacifier to your baby in the first 4-6 weeks after your baby is born. After this time, you may choose to use a pacifier. Research has shown that pacifier use during the first year of a baby's life decreases the risk of sudden infant death syndrome (SIDS). Allow your baby to feed on each breast as long as he or she wants. When your baby unlatches or falls asleep while feeding from the first breast, offer the second breast. Because newborns are often sleepy in the first few weeks of life, you may need to awaken your baby to get him or her to feed. Breastfeeding times will vary from baby to baby. However, the following rules can serve as a guide to help you make sure that your baby is properly fed:  Newborns (babies 40 weeks of age or younger) may breastfeed every 1-3 hours.  Newborns should not go without breastfeeding for longer than 3 hours during the day or 5 hours during the night.  You should breastfeed your baby a minimum of 8 times in a 24-hour period. Breast milk pumping     Pumping and storing breast milk  allows you to make sure that your baby is exclusively fed your breast milk, even at times when you are  unable to breastfeed. This is especially important if you go back to work while you are still breastfeeding, or if you are not able to be present during feedings. Your lactation consultant can help you find a method of pumping that works best for you and give you guidelines about how long it is safe to store breast milk. Caring for your breasts while you breastfeed Nipples can become dry, cracked, and sore while breastfeeding. The following recommendations can help keep your breasts moisturized and healthy:  Avoid using soap on your nipples.  Wear a supportive bra designed especially for nursing. Avoid wearing underwire-style bras or extremely tight bras (sports bras).  Air-dry your nipples for 3-4 minutes after each feeding.  Use only cotton bra pads to absorb leaked breast milk. Leaking of breast milk between feedings is normal.  Use lanolin on your nipples after breastfeeding. Lanolin helps to maintain your skin's normal moisture barrier. Pure lanolin is not harmful (not toxic) to your baby. You may also hand express a few drops of breast milk and gently massage that milk into your nipples and allow the milk to air-dry. In the first few weeks after giving birth, some women experience breast engorgement. Engorgement can make your breasts feel heavy, warm, and tender to the touch. Engorgement peaks within 3-5 days after you give birth. The following recommendations can help to ease engorgement:  Completely empty your breasts while breastfeeding or pumping. You may want to start by applying warm, moist heat (in the shower or with warm, water-soaked hand towels) just before feeding or pumping. This increases circulation and helps the milk flow. If your baby does not completely empty your breasts while breastfeeding, pump any extra milk after he or she is finished.  Apply ice packs to your breasts immediately after breastfeeding or pumping, unless this is too uncomfortable for you. To do this: ? Put  ice in a plastic bag. ? Place a towel between your skin and the bag. ? Leave the ice on for 20 minutes, 2-3 times a day.  Make sure that your baby is latched on and positioned properly while breastfeeding. If engorgement persists after 48 hours of following these recommendations, contact your health care provider or a Advertising copywriterlactation consultant. Overall health care recommendations while breastfeeding  Eat 3 healthy meals and 3 snacks every day. Well-nourished mothers who are breastfeeding need an additional 450-500 calories a day. You can meet this requirement by increasing the amount of a balanced diet that you eat.  Drink enough water to keep your urine pale yellow or clear.  Rest often, relax, and continue to take your prenatal vitamins to prevent fatigue, stress, and low vitamin and mineral levels in your body (nutrient deficiencies).  Do not use any products that contain nicotine or tobacco, such as cigarettes and e-cigarettes. Your baby may be harmed by chemicals from cigarettes that pass into breast milk and exposure to secondhand smoke. If you need help quitting, ask your health care provider.  Avoid alcohol.  Do not use illegal drugs or marijuana.  Talk with your health care provider before taking any medicines. These include over-the-counter and prescription medicines as well as vitamins and herbal supplements. Some medicines that may be harmful to your baby can pass through breast milk.  It is possible to become pregnant while breastfeeding. If birth control is desired, ask your health care provider about  options that will be safe while breastfeeding your baby. Where to find more information: [employer]: www.llli.org Contact a health care provider if:  You feel like you want to stop breastfeeding or have become frustrated with breastfeeding.  Your nipples are cracked or bleeding.  Your breasts are red, tender, or warm.  You have: ? Painful breasts or  nipples. ? A swollen area on either breast. ? A fever or chills. ? Nausea or vomiting. ? Drainage other than breast milk from your nipples.  Your breasts do not become full before feedings by the fifth day after you give birth.  You feel sad and depressed.  Your baby is: ? Too sleepy to eat well. ? Having trouble sleeping. ? More than 20 week old and wetting fewer than 6 diapers in a 24-hour period. ? Not gaining weight by 6 days of age.  Your baby has fewer than 3 stools in a 24-hour period.  Your baby's skin or the white parts of his or her eyes become yellow. Get help right away if:  Your baby is overly tired (lethargic) and does not want to wake up and feed.  Your baby develops an unexplained fever. Summary  Breastfeeding offers many health benefits for infant and mothers.  Try to breastfeed your infant when he or she shows early signs of hunger.  Gently tickle or stroke your baby's lips with your finger or nipple to allow the baby to open his or her mouth. Bring the baby to your breast. Make sure that much of the areola is in your baby's mouth. Offer one side and burp the baby before you offer the other side.  Talk with your health care provider or lactation consultant if you have questions or you face problems as you breastfeed. This information is not intended to replace advice given to you by your health care provider. Make sure you discuss any questions you have with your health care provider. Document Revised: 01/04/2018 Document Reviewed: 11/11/2016 Elsevier Patient Education  2020 [employer] Inc. GESTATIONAL DIABETES TESTING FOR PREGNANCY  Pregnant women can develop a condition known as Gestational Diabetes (diabetes brought on by pregnancy) which can pose a risk to both mother and baby. A glucose tolerance test is a common type of testing for potential gestational diabetes.  There are several tests intended to identify gestational diabetes in pregnant women. The  first, called the Glucose Challenge Screening, is a preliminary screening test performed between 26-28 weeks. If a woman tests positive during this screening test, the second test, called the Glucose Tolerance Test, may be performed. This test will diagnose whether diabetes exists or not by indicating whether or not the body is using glucose (a type of sugar) effectively.  The Glucose Challenge Screening is now considered to be a standard test performed during the early part of the third trimester of pregnancy.  What is the Glucose Challenge Screening Test? No preparation is required prior to the test. During the test, the mother is asked to drink a sweet liquid (glucose) and then will have blood drawn one hour from having the drink, as blood glucose levels normally peak within one hour. No fasting is required prior to this test.  The test evaluates how your body processes sugar. A high level in your blood may indicate your body is not processing sugar effectively (positive test). If the results of this screen are positive, the woman may have the Glucose Tolerance Test performed. It is important to note that not  all women who test positive for the Glucose Challenge Screening test are found to have diabetes upon further diagnosis.  What is the Glucose Tolerance Test? Prior to the taking the glucose tolerance test, your doctor will ask you to make sure and eat at least 150mg  of carbohydrates (about what you will get from a slice or two of bread) for three days prior to the time you will be asked to fast. You will not be permitted to eat or drink anything but sips of water for 14 hours prior to the test, so it is best to schedule the test for first thing in the morning.  Additionally, you should plan to have someone drive you to and from the test since your energy levels may be low and there is a slight possibility you may feel light-headed.  When you arrive, the technician will draw blood to measure your  baseline "fasting blood glucose level". You will be asked to drink a larger volume (or more concentrated solution) of the glucose drink than was used in the initial Glucose Challenge Screening test. Your blood will be drawn and tested every hour for the next three hours.  The following are the values that the American Diabetes Association considers to be abnormal during the Glucose Tolerance Test:  Interval Abnormal reading Fasting 95 mg/dl or higher One hour mg/dl or higher Two hours 812 mg/dl or higher Three hours 751 mg/dl or higher   What if my Glucose Tolerance Test Results are Abnormal? If only one of your readings comes back abnormal, your doctor may suggest some changes to your diet and/or test you again later in the pregnancy. If two or more of your readings come back abnormal, you'll be diagnosed with Gestational Diabetes and your doctor or midwife will talk to you about a treatment plan. Treating diabetes during pregnancy is extremely important to protect the health of both mother and baby.   Compiled using information from the following sources:  1. American Diabetes Association  https://www.diabetes.org  2. [employer]  https://www.emedicine.com  3. 700 of Diabetes and Digestive and Kidney Diseases

## 2020-06-17 ENCOUNTER — Other Ambulatory Visit: Payer: Self-pay

## 2020-06-17 ENCOUNTER — Encounter: Payer: Self-pay | Admitting: Obstetrics and Gynecology

## 2020-06-17 ENCOUNTER — Ambulatory Visit (INDEPENDENT_AMBULATORY_CARE_PROVIDER_SITE_OTHER): Payer: BC Managed Care – PPO | Admitting: Obstetrics and Gynecology

## 2020-06-17 VITALS — BP 110/74 | HR 101 | Wt 195.6 lb

## 2020-06-17 DIAGNOSIS — Z131 Encounter for screening for diabetes mellitus: Secondary | ICD-10-CM

## 2020-06-17 DIAGNOSIS — A599 Trichomoniasis, unspecified: Secondary | ICD-10-CM

## 2020-06-17 DIAGNOSIS — Z3482 Encounter for supervision of other normal pregnancy, second trimester: Secondary | ICD-10-CM

## 2020-06-17 DIAGNOSIS — Z3A24 24 weeks gestation of pregnancy: Secondary | ICD-10-CM

## 2020-06-17 LAB — POCT URINALYSIS DIPSTICK OB
Bilirubin, UA: NEGATIVE
Blood, UA: NEGATIVE
Glucose, UA: NEGATIVE
Ketones, UA: NEGATIVE
Nitrite, UA: NEGATIVE
Spec Grav, UA: 1.015 (ref 1.010–1.025)
Urobilinogen, UA: 0.2 E.U./dL
pH, UA: 7 (ref 5.0–8.0)

## 2020-06-17 MED ORDER — FLUCONAZOLE 150 MG PO TABS
150.0000 mg | ORAL_TABLET | Freq: Once | ORAL | 1 refills | Status: AC
Start: 2020-06-17 — End: 2020-06-17

## 2020-06-17 NOTE — Progress Notes (Signed)
ROB: Patient presents for routine visit. Notes she is overall doing well. Did develop a yeast rash again due to taking repeat dose of Flagyl for trichomonas.  Notes she gave partner refill dose. Will need TOC next visit. Given Diflucan for treatment. Still having issues with her job and intermittent leave (FMLA forms).  Given new work Biomedical engineer. Expresses interest in waterbirth. Advised to have a consultation with midwifery service to discuss if this is something she will proceed with. Understands that if she elects for waterbirth she will transition to midwifery service. RTC in 4 weeks, for 28 week labs at that time.   The following were addressed during this visit:  Breastfeeding Education - Early initiation of breastfeeding    Comments: Keeps milk supply adequate, helps contract uterus and slow bleeding, and early milk is the perfect first food and is easy to digest.   - The importance of exclusive breastfeeding    Comments: Provides antibodies, Lower risk of breast and ovarian cancers, and type-2 diabetes,Helps your body recover, Reduced chance of SIDS.   - Exclusive breastfeeding for the first 6 months    Comments: Builds a healthy milk supply and keeps it up, protects baby from sickness and disease, and breastmilk has everything your baby needs for the first 6 months.

## 2020-06-17 NOTE — Progress Notes (Signed)
ROB-Pt present for routine prenatal care. Pt stated having lower abd pain and pressure.

## 2020-06-18 NOTE — Telephone Encounter (Signed)
Seen pt in office on 06/17/2020 and spoke about her issues with her FMLA form and her yeast infection.

## 2020-06-21 ENCOUNTER — Encounter (HOSPITAL_COMMUNITY): Payer: Self-pay | Admitting: Obstetrics & Gynecology

## 2020-06-21 ENCOUNTER — Inpatient Hospital Stay (HOSPITAL_COMMUNITY)
Admission: AD | Admit: 2020-06-21 | Discharge: 2020-06-21 | Disposition: A | Discharge status: Home / Self Care | Payer: BC Managed Care – PPO | Attending: Obstetrics & Gynecology | Admitting: Obstetrics & Gynecology

## 2020-06-21 ENCOUNTER — Other Ambulatory Visit: Payer: Self-pay

## 2020-06-21 DIAGNOSIS — O26892 Other specified pregnancy related conditions, second trimester: Secondary | ICD-10-CM | POA: Diagnosis not present

## 2020-06-21 DIAGNOSIS — R109 Unspecified abdominal pain: Secondary | ICD-10-CM | POA: Diagnosis not present

## 2020-06-21 DIAGNOSIS — R519 Headache, unspecified: Secondary | ICD-10-CM | POA: Diagnosis not present

## 2020-06-21 DIAGNOSIS — O99891 Other specified diseases and conditions complicating pregnancy: Secondary | ICD-10-CM

## 2020-06-21 DIAGNOSIS — Z3A24 24 weeks gestation of pregnancy: Secondary | ICD-10-CM | POA: Insufficient documentation

## 2020-06-21 DIAGNOSIS — O219 Vomiting of pregnancy, unspecified: Secondary | ICD-10-CM

## 2020-06-21 DIAGNOSIS — R110 Nausea: Secondary | ICD-10-CM | POA: Insufficient documentation

## 2020-06-21 DIAGNOSIS — M549 Dorsalgia, unspecified: Secondary | ICD-10-CM

## 2020-06-21 LAB — URINALYSIS, ROUTINE W REFLEX MICROSCOPIC
Bilirubin Urine: NEGATIVE
Glucose, UA: NEGATIVE mg/dL
Hgb urine dipstick: NEGATIVE
Ketones, ur: NEGATIVE mg/dL
Nitrite: NEGATIVE
Protein, ur: NEGATIVE mg/dL
Specific Gravity, Urine: 1.006 (ref 1.005–1.030)
pH: 6 (ref 5.0–8.0)

## 2020-06-21 MED ORDER — LACTATED RINGERS IV SOLN: Freq: Once | INTRAVENOUS | Status: AC

## 2020-06-21 MED ORDER — DIPHENHYDRAMINE HCL 50 MG/ML IJ SOLN
25.0000 mg | Freq: Once | INTRAMUSCULAR | Status: AC
  Administered 2020-06-21: 25 mg via INTRAVENOUS
  Filled 2020-06-21: qty 1

## 2020-06-21 MED ORDER — DEXAMETHASONE SODIUM PHOSPHATE 10 MG/ML IJ SOLN
10.0000 mg | Freq: Once | INTRAMUSCULAR | Status: AC
  Administered 2020-06-21: 10 mg via INTRAVENOUS
  Filled 2020-06-21: qty 1

## 2020-06-21 MED ORDER — ONDANSETRON 8 MG PO TBDP
8.0000 mg | ORAL_TABLET | Freq: Three times a day (TID) | ORAL | 0 refills | Status: DC | PRN
Start: 2020-06-21 — End: 2020-09-01

## 2020-06-21 MED ORDER — METOCLOPRAMIDE HCL 5 MG/ML IJ SOLN
10.0000 mg | Freq: Once | INTRAMUSCULAR | Status: AC
  Administered 2020-06-21: 10 mg via INTRAVENOUS
  Filled 2020-06-21: qty 2

## 2020-06-21 NOTE — MAU Note (Signed)
Laura Hernandez is a 29 y.o. at [redacted]w[redacted]d here in MAU reporting: left sided pain that wraps around from the side of her belly to her back, pt also reports neck pain and headache. Denies VB/LOF +FM Onset of complaint: 4am Pain score: 8 There were no vitals filed for this visit.   FHT:146 Lab orders placed from triage:  U/A

## 2020-06-21 NOTE — MAU Provider Note (Signed)
History     CSN: [id number]  Arrival date and time: 06/21/20 8295   First Provider Initiated Contact with Patient 06/21/20 2002      Chief Complaint  Patient presents with  . Abdominal Pain  . Neck Pain   HPI Laura Hernandez 29 y.o. [redacted]w[redacted]d Client was almost asleep when provider came into the room.  She did not initially respond to provider calling her name. She has had a headache for 2 days and nausea.  Did not take anything for her headache.  Has not been drinking fluids as she is nauseated.  Is eating food and has not vomited.  Food recall - has had something to eat several times today with egg and sausage biscuit at 6 am, snacks throughout the day and full meal at [employer] at 5 pm including meat, vegetables and dessert.  Does have some lower abdominal pain only on the left side that starts below her ribs and goes all the way down her side.   Has had some back pain down her entire spine, but her headache is the biggest problem for her tonight.  Is afraid to use her Zofran as she only has a few pills left.    Has had neck pain and back pain for several weeks in the pregnancy.  It was worse today but it has been longstanding.  She has muscle relaxer pills but did not take one today.    Goes to the [place] office.  OB History    Gravida  2   Para      Term      Preterm      AB  1   Living        SAB  1   TAB      Ectopic      Multiple      Live Births              Past Medical History:  Diagnosis Date  . Bronchitis   . Chlamydia   . Migraines 2012  . Trichomonas     Past Surgical History:  Procedure Laterality Date  . NO PAST SURGERIES      Family History  Problem Relation Age of Onset  . Asthma Mother   . Arthritis Mother   . Diabetes Mother   . Heart disease Mother   . Hypertension Mother   . Stroke Mother   . Kidney disease Mother   . Vision loss Mother   . Diabetes Maternal Grandmother   . Hypertension Maternal Grandmother     Social History    Tobacco Use  . Smoking status: Never Smoker  . Smokeless tobacco: Never Used  Vaping Use  . Vaping Use: Never used  Substance Use Topics  . Alcohol use: No    Comment: socially  . Drug use: No    Allergies:  Allergies  Allergen Reactions  . Latex Itching and Rash    Medications Prior to Admission  Medication Sig Dispense Refill Last Dose  . ondansetron (ZOFRAN ODT) 4 MG disintegrating tablet Take 1 tablet (4 mg total) by mouth every 6 (six) hours as needed for nausea. 20 tablet 0 Past Week at Unknown time  . acetaminophen (TYLENOL) 500 MG tablet Take 1,000 mg by mouth 2 (two) times daily as needed for pain.  (Patient not taking: Reported on 06/17/2020)     . albuterol (PROVENTIL HFA;VENTOLIN HFA) 108 (90 Base) MCG/ACT inhaler Inhale 1-2 puffs into the lungs every 6 (six)  hours as needed for wheezing or shortness of breath. 1 Inhaler 0 More than a month at Unknown time  . cyclobenzaprine (FLEXERIL) 10 MG tablet Take 1 tablet (10 mg total) by mouth 3 (three) times daily as needed for muscle spasms. 30 tablet 1  at not taking  . diphenhydrAMINE (BENADRYL) 25 MG tablet Take 25 mg by mouth every 6 (six) hours as needed for allergies. (Patient not taking: Reported on 05/21/2020)     . metroNIDAZOLE (FLAGYL) 500 MG tablet Take two tablets by mouth twice a day, for one day.  Or you can take all four tablets at once if you can tolerate it. (Patient not taking: Reported on 06/17/2020) 4 tablet 0   . Prenatal Vit-Fe Fumarate-FA (PRENATAL VITAMIN PO) Take by mouth.       Review of Systems  Constitutional: Negative for fever.  Respiratory: Negative for choking and shortness of breath.   Gastrointestinal: Positive for abdominal pain and nausea. Negative for vomiting.  Genitourinary: Negative for dysuria, vaginal bleeding and vaginal discharge.   Physical Exam   Blood pressure 133/72, pulse 91, temperature 98.5 F (36.9 C), temperature source Oral, resp. rate 18, weight 89.8 kg, last menstrual  period 12/31/2019, SpO2 100 %, unknown if currently breastfeeding.  Physical Exam Vitals and nursing note reviewed.  Constitutional:      Appearance: She is well-developed.  HENT:     Head: Normocephalic.  Cardiovascular:     Rate and Rhythm: Normal rate.  Pulmonary:     Effort: Pulmonary effort is normal.  Abdominal:     Palpations: Abdomen is soft.     Tenderness: There is abdominal tenderness. There is no guarding or rebound.     Comments: Pain in left abdomen begins under rib cage on the left side and goes down to the LLQ.  Mild in quality. Begins well above the fundus and does not seem to be connected to the uterus. No contractions palpated or traced on monitor strip. FHT 150 with moderate variability 10x10 accels noted No decelerations No contractions traced or palpated. Category 1 tracing appropriate for gestational age  Musculoskeletal:        General: Normal range of motion.     Cervical back: Neck supple.  Skin:    General: Skin is warm and dry.  Neurological:     Mental Status: She is alert and oriented to person, place, and time.     MAU Course  Procedures LABS Results for orders placed or performed during the hospital encounter of 06/21/20 (from the past 24 hour(s))  Urinalysis, Routine w reflex microscopic Urine, Clean Catch     Status: Abnormal   Collection Time: 06/21/20  8:20 PM  Result Value Ref Range   Color, Urine YELLOW YELLOW   APPearance CLEAR CLEAR   Specific Gravity, Urine 1.006 1.005 - 1.030   pH 6.0 5.0 - 8.0   Glucose, UA NEGATIVE NEGATIVE mg/dL   Hgb urine dipstick NEGATIVE NEGATIVE   Bilirubin Urine NEGATIVE NEGATIVE   Ketones, ur NEGATIVE NEGATIVE mg/dL   Protein, ur NEGATIVE NEGATIVE mg/dL   Nitrite NEGATIVE NEGATIVE   Leukocytes,Ua MODERATE (A) NEGATIVE   RBC / HPF 0-5 0 - 5 RBC/hpf   WBC, UA 11-20 0 - 5 WBC/hpf   Bacteria, UA RARE (A) NONE SEEN   Squamous Epithelial / LPF 0-5 0 - 5   Mucus PRESENT      MDM Will give IVF with  headache protocol and will plan to refill Zofran at discharge.  9: 15 pm - headache is resolved.  Still has some periodic left sided abdominal pain and back pain but feels ready to go home.  Has trouble swallowing pills at home.  Advised practicing swallowing mini-M&Ms to develop the ability to swallow pills.  May need to use chewable tylenol or muscle relaxer pill that she has at home if needed for back pain.  Declines muscle relaxer while in MAU today.  Had BM after Left sided abdominal pain began and it did not go away.  Also had BM here today and pain has continued.  Advised this is not labor as it is on one side only.  Unsure what this pain is at this time.  Patient wants to go home as she is feeling better.  Discussed being seen in the office this week if abdominal pain persists.  And if back and neck pain are worsening, be seen in the office - may need a referral to PT to review exercises for this persistent musculoskeletal problem.  Assessment and Plan  Headache in pregnancy - resolved with IVF and IV medication Back pain in pregnancy - still has some pain that has been with her through most of the pregnancy.  Left sided abdominal pain. Nausea in pregnancy  Plan If your pain is worsening, call the office and schedule to be seen sooner than your next appointment. Use your medication you have at home for nausea and back pain. Drink at least 8 8-oz glasses of water every day.    Tayleigh Wetherell L Taeshawn Helfman 06/21/2020, 8:08 PM

## 2020-06-21 NOTE — Discharge Instructions (Signed)
If your pain is worsening, call the office and schedule to be seen sooner than your next appointment. Use your medication you have at home for nausea and back pain. Drink at least 8 8-oz glasses of water every day.

## 2020-06-26 ENCOUNTER — Telehealth: Payer: Self-pay | Admitting: Obstetrics and Gynecology

## 2020-06-26 NOTE — Telephone Encounter (Addendum)
Pt called  In and stated that she is having back pain and she is also having nausea. The pt is wanting to know if she should go to the ED. I told the pt I will send a message to the nurse. The pt verbally understood.

## 2020-06-26 NOTE — Telephone Encounter (Signed)
Spoke to pt concerning her call to the office. Pt stated that her lower back was really hurting and she was in a lot of pain. Pt stated that the pain was unbearable causing it hard for her to drive or do daily living tasks. Pt stated that she has tried heating pad, warm baths, and rest without any relief. Pt was advised to take 2 Tylenols every 6 hours and cold patches instead of heat and see if that would relieve the pain and try massaging the area followed with heat. Pt stated that she would try that. Pt was advised that if the pain increased and she was unable to walk or stand to please go to the ED. Pt voiced that she understood.

## 2020-06-30 ENCOUNTER — Encounter: Payer: BC Managed Care – PPO | Admitting: Obstetrics and Gynecology

## 2020-06-30 NOTE — Progress Notes (Signed)
ROB-Pt present for back pain. Pt stated that it was really hard for her to walk and do daily living skills.  Pt c/o of pain from her neck to her lower back. GAD-7=14. PHQ-9=7.

## 2020-06-30 NOTE — Patient Instructions (Addendum)
Back Pain in Pregnancy Back pain during pregnancy is common. Back pain may be caused by several factors that are related to changes during your pregnancy. Follow these instructions at home: Managing pain, stiffness, and swelling      If directed, for sudden (acute) back pain, put ice on the painful area. ? Put ice in a plastic bag. ? Place a towel between your skin and the bag. ? Leave the ice on for 20 minutes, 2-3 times per day.  If directed, apply heat to the affected area before you exercise. Use the heat source that your health care provider recommends, such as a moist heat pack or a heating pad. ? Place a towel between your skin and the heat source. ? Leave the heat on for 20-30 minutes. ? Remove the heat if your skin turns bright red. This is especially important if you are unable to feel pain, heat, or cold. You may have a greater risk of getting burned.  If directed, massage the affected area. Activity  Exercise as told by your health care provider. Gentle exercise is the best way to prevent or manage back pain.  Listen to your body when lifting. If lifting hurts, ask for help or bend your knees. This uses your leg muscles instead of your back muscles.  Squat down when picking up something from the floor. Do not bend over.  Only use bed rest for short periods as told by your health care provider. Bed rest should only be used for the most severe episodes of back pain. Standing, sitting, and lying down  Do not stand in one place for long periods of time.  Use good posture when sitting. Make sure your head rests over your shoulders and is not hanging forward. Use a pillow on your lower back if necessary.  Try sleeping on your side, preferably the left side, with a pregnancy support pillow or 1-2 regular pillows between your legs. ? If you have back pain after a night's rest, your bed may be too soft. ? A firm mattress may provide more support for your back during  pregnancy. General instructions  Do not wear high heels.  Eat a healthy diet. Try to gain weight within your health care provider's recommendations.  Use a maternity girdle, elastic sling, or back brace as told by your health care provider.  Take over-the-counter and prescription medicines only as told by your health care provider.  Work with a physical therapist or massage therapist to find ways to manage back pain. Acupuncture or massage therapy may be helpful.  Keep all follow-up visits as told by your health care provider. This is important. Contact a health care provider if:  Your back pain interferes with your daily activities.  You have increasing pain in other parts of your body. Get help right away if:  You develop numbness, tingling, weakness, or problems with the use of your arms or legs.  You develop severe back pain that is not controlled with medicine.  You have a change in bowel or bladder control.  You develop shortness of breath, dizziness, or you faint.  You develop nausea, vomiting, or sweating.  You have back pain that is a rhythmic, cramping pain similar to labor pains. Labor pain is usually 1-2 minutes apart, lasts for about 1 minute, and involves a bearing down feeling or pressure in your pelvis.  You have back pain and your water breaks or you have vaginal bleeding.  You have back pain or numbness  that travels down your leg.  Your back pain developed after you fell.  You develop pain on one side of your back.  You see blood in your urine.  You develop skin blisters in the area of your back pain. Summary  Back pain may be caused by several factors that are related to changes during your pregnancy.  Follow instructions as told by your health care provider for managing pain, stiffness, and swelling.  Exercise as told by your health care provider. Gentle exercise is the best way to prevent or manage back pain.  Take over-the-counter and  prescription medicines only as told by your health care provider.  Keep all follow-up visits as told by your health care provider. This is important. This information is not intended to replace advice given to you by your health care provider. Make sure you discuss any questions you have with your health care provider. Document Revised: 01/29/2019 Document Reviewed: 03/28/2018 Elsevier Patient Education  2020 [employer]    Back Exercises These exercises help to make your trunk and back strong. They also help to keep the lower back flexible. Doing these exercises can help to prevent back pain or lessen existing pain.  If you have back pain, try to do these exercises 2-3 times each day or as told by your doctor.  As you get better, do the exercises once each day. Repeat the exercises more often as told by your doctor.  To stop back pain from coming back, do the exercises once each day, or as told by your doctor. Exercises Single knee to chest Do these steps 3-5 times in a row for each leg: 1. Lie on your back on a firm bed or the floor with your legs stretched out. 2. Bring one knee to your chest. 3. Grab your knee or thigh with both hands and hold them it in place. 4. Pull on your knee until you feel a gentle stretch in your lower back or buttocks. 5. Keep doing the stretch for 10-30 seconds. 6. Slowly let go of your leg and straighten it. Pelvic tilt Do these steps 5-10 times in a row: 1. Lie on your back on a firm bed or the floor with your legs stretched out. 2. Bend your knees so they point up to the ceiling. Your feet should be flat on the floor. 3. Tighten your lower belly (abdomen) muscles to press your lower back against the floor. This will make your tailbone point up to the ceiling instead of pointing down to your feet or the floor. 4. Stay in this position for 5-10 seconds while you gently tighten your muscles and breathe evenly. Cat-cow Do these steps until your lower  back bends more easily: 1. Get on your hands and knees on a firm surface. Keep your hands under your shoulders, and keep your knees under your hips. You may put padding under your knees. 2. Let your head hang down toward your chest. Tighten (contract) the muscles in your belly. Point your tailbone toward the floor so your lower back becomes rounded like the back of a cat. 3. Stay in this position for 5 seconds. 4. Slowly lift your head. Let the muscles of your belly relax. Point your tailbone up toward the ceiling so your back forms a sagging arch like the back of a cow. 5. Stay in this position for 5 seconds.  Press-ups Do these steps 5-10 times in a row: 1. Lie on your belly (face-down) on the floor.  2. Place your hands near your head, about shoulder-width apart. 3. While you keep your back relaxed and keep your hips on the floor, slowly straighten your arms to raise the top half of your body and lift your shoulders. Do not use your back muscles. You may change where you place your hands in order to make yourself more comfortable. 4. Stay in this position for 5 seconds. 5. Slowly return to lying flat on the floor.  Bridges Do these steps 10 times in a row: 1. Lie on your back on a firm surface. 2. Bend your knees so they point up to the ceiling. Your feet should be flat on the floor. Your arms should be flat at your sides, next to your body. 3. Tighten your butt muscles and lift your butt off the floor until your waist is almost as high as your knees. If you do not feel the muscles working in your butt and the back of your thighs, slide your feet 1-2 inches farther away from your butt. 4. Stay in this position for 3-5 seconds. 5. Slowly lower your butt to the floor, and let your butt muscles relax. If this exercise is too easy, try doing it with your arms crossed over your chest. Belly crunches Do these steps 5-10 times in a row: 1. Lie on your back on a firm bed or the floor with your  legs stretched out. 2. Bend your knees so they point up to the ceiling. Your feet should be flat on the floor. 3. Cross your arms over your chest. 4. Tip your chin a little bit toward your chest but do not bend your neck. 5. Tighten your belly muscles and slowly raise your chest just enough to lift your shoulder blades a tiny bit off of the floor. Avoid raising your body higher than that, because it can put too much stress on your low back. 6. Slowly lower your chest and your head to the floor. Back lifts Do these steps 5-10 times in a row: 1. Lie on your belly (face-down) with your arms at your sides, and rest your forehead on the floor. 2. Tighten the muscles in your legs and your butt. 3. Slowly lift your chest off of the floor while you keep your hips on the floor. Keep the back of your head in line with the curve in your back. Look at the floor while you do this. 4. Stay in this position for 3-5 seconds. 5. Slowly lower your chest and your face to the floor. Contact a doctor if:  Your back pain gets a lot worse when you do an exercise.  Your back pain does not get better 2 hours after you exercise. If you have any of these problems, stop doing the exercises. Do not do them again unless your doctor says it is okay. Get help right away if:  You have sudden, very bad back pain. If this happens, stop doing the exercises. Do not do them again unless your doctor says it is okay. This information is not intended to replace advice given to you by your health care provider. Make sure you discuss any questions you have with your health care provider. Document Revised: 07/05/2018 Document Reviewed: 07/05/2018 Elsevier Patient Education  2020 [employer]  Neck Exercises Ask your health care provider which exercises are safe for you. Do exercises exactly as told by your health care provider and adjust them as directed. It is normal to feel mild stretching, pulling, tightness,  or discomfort as  you do these exercises. Stop right away if you feel sudden pain or your pain gets worse. Do not begin these exercises until told by your health care provider. Neck exercises can be important for many reasons. They can improve strength and maintain flexibility in your neck, which will help your upper back and prevent neck pain. Stretching exercises Rotation neck stretching  1. Sit in a chair or stand up. 2. Place your feet flat on the floor, shoulder width apart. 3. Slowly turn your head (rotate) to the right until a slight stretch is felt. Turn it all the way to the right so you can look over your right shoulder. Do not tilt or tip your head. 4. Hold this position for 10-30 seconds. 5. Slowly turn your head (rotate) to the left until a slight stretch is felt. Turn it all the way to the left so you can look over your left shoulder. Do not tilt or tip your head. 6. Hold this position for 10-30 seconds. Repeat __________ times. Complete this exercise __________ times a day. Neck retraction 1. Sit in a sturdy chair or stand up. 2. Look straight ahead. Do not bend your neck. 3. Use your fingers to push your chin backward (retraction). Do not bend your neck for this movement. Continue to face straight ahead. If you are doing the exercise properly, you will feel a slight sensation in your throat and a stretch at the back of your neck. 4. Hold the stretch for 1-2 seconds. Repeat __________ times. Complete this exercise __________ times a day. Strengthening exercises Neck press 1. Lie on your back on a firm bed or on the floor with a pillow under your head. 2. Use your neck muscles to push your head down on the pillow and straighten your spine. 3. Hold the position as well as you can. Keep your head facing up (in a neutral position) and your chin tucked. 4. Slowly count to 5 while holding this position. Repeat __________ times. Complete this exercise __________ times a day. Isometrics These are  exercises in which you strengthen the muscles in your neck while keeping your neck still (isometrics). 1. Sit in a supportive chair and place your hand on your forehead. 2. Keep your head and face facing straight ahead. Do not flex or extend your neck while doing isometrics. 3. Push forward with your head and neck while pushing back with your hand. Hold for 10 seconds. 4. Do the sequence again, this time putting your hand against the back of your head. Use your head and neck to push backward against the hand pressure. 5. Finally, do the same exercise on either side of your head, pushing sideways against the pressure of your hand. Repeat __________ times. Complete this exercise __________ times a day. Prone head lifts 1. Lie face-down (prone position), resting on your elbows so that your chest and upper back are raised. 2. Start with your head facing downward, near your chest. Position your chin either on or near your chest. 3. Slowly lift your head upward. Lift until you are looking straight ahead. Then continue lifting your head as far back as you can comfortably stretch. 4. Hold your head up for 5 seconds. Then slowly lower it to your starting position. Repeat __________ times. Complete this exercise __________ times a day. Supine head lifts 1. Lie on your back (supine position), bending your knees to point to the ceiling and keeping your feet flat on the floor. 2. Lift  your head slowly off the floor, raising your chin toward your chest. 3. Hold for 5 seconds. Repeat __________ times. Complete this exercise __________ times a day. Scapular retraction 1. Stand with your arms at your sides. Look straight ahead. 2. Slowly pull both shoulders (scapulae) backward and downward (retraction) until you feel a stretch between your shoulder blades in your upper back. 3. Hold for 10-30 seconds. 4. Relax and repeat. Repeat __________ times. Complete this exercise __________ times a day. Contact a health  care provider if:  Your neck pain or discomfort gets much worse when you do an exercise.  Your neck pain or discomfort does not improve within 2 hours after you exercise. If you have any of these problems, stop exercising right away. Do not do the exercises again unless your health care provider says that you can. Get help right away if:  You develop sudden, severe neck pain. If this happens, stop exercising right away. Do not do the exercises again unless your health care provider says that you can. This information is not intended to replace advice given to you by your health care provider. Make sure you discuss any questions you have with your health care provider. Document Revised: 08/08/2018 Document Reviewed: 08/08/2018 Elsevier Patient Education  2020 [employer]

## 2020-07-01 ENCOUNTER — Ambulatory Visit (INDEPENDENT_AMBULATORY_CARE_PROVIDER_SITE_OTHER): Payer: BC Managed Care – PPO | Admitting: Obstetrics and Gynecology

## 2020-07-01 ENCOUNTER — Encounter: Payer: Self-pay | Admitting: Obstetrics and Gynecology

## 2020-07-01 ENCOUNTER — Other Ambulatory Visit: Payer: Self-pay

## 2020-07-01 VITALS — BP 104/72 | HR 91 | Wt 195.9 lb

## 2020-07-01 DIAGNOSIS — Z3482 Encounter for supervision of other normal pregnancy, second trimester: Secondary | ICD-10-CM

## 2020-07-01 DIAGNOSIS — F4323 Adjustment disorder with mixed anxiety and depressed mood: Secondary | ICD-10-CM | POA: Insufficient documentation

## 2020-07-01 DIAGNOSIS — O99891 Other specified diseases and conditions complicating pregnancy: Secondary | ICD-10-CM

## 2020-07-01 DIAGNOSIS — M549 Dorsalgia, unspecified: Secondary | ICD-10-CM

## 2020-07-01 HISTORY — DX: Adjustment disorder with mixed anxiety and depressed mood: F43.23

## 2020-07-01 NOTE — Progress Notes (Signed)
Problem OB Visit: Patient complains of neck and back pain, is difficult to walk or drive. This is  Has tried heating pads, ice packs, braces, muscle relaxers, but they have only provided minimal relief,  Had to take off of work as she is was in such pain. Went to the ER ~ 1 week ago due to pain. Had to do heavy lifting at her job, despite letters from provider giving restrictions. Also notes that due to her reduced hours, her paycheck is almost nothing.  Notes that her this situation with her job has stressed her to the point of now she is seeing a Veterinary surgeon.  PHQ-9 score today is 7, GAD-7 score is 14. States that she has had a miscarriage before and is worried that her job is going to force her into preterm labor. Is not sleeping. Feels worn out. Wonders if she should just quit her job or if she should be taken out. Advised on referral to physical therapy. Based on therapy notes, can justify being taken out of work at that time as she will have reached third trimester.  If taken out of work, can the draw unemployment which will help financially.   RTC in 2 weeks for routine visit with Dr. Logan Bores. For 28 week labs at that visit, and will need TOC for Trichomonas infection.

## 2020-07-03 ENCOUNTER — Telehealth: Payer: Self-pay | Admitting: Obstetrics and Gynecology

## 2020-07-03 NOTE — Telephone Encounter (Signed)
I am not sure what other information I can give her job at this point.  I have written excuses for the days she has requested off, and have given several work Forensic psychologist in pregnancy and completed her FMLA paperwork.  I am not sure at this point I have to provide for them.  This may be a point at which she has to cut her losses with the job.   Dr. Valentino Saxon

## 2020-07-03 NOTE — Telephone Encounter (Signed)
Yes ma'am, I will call her and let her know. I can offer the pt to come in and talk with you if that okay? Please let me know what else we could do. Thank you

## 2020-07-03 NOTE — Telephone Encounter (Signed)
Pt called in and stated that she needs a call back. The pt stated that her job is requesting more information  Pregnancy. The pt is saying that she has to work tomorrow and she is saying that she is thinking of quitting her job. The pt said that she is having mental and stress from her job. The pt stated that her job stated they will terminate her if she doesn't get the paperwork turned in ASAP. They told her if she doesn't have it in by 9/17 she will be let go. Please advise

## 2020-07-04 NOTE — Telephone Encounter (Signed)
No, we've already spoken regarding this, she does not need to come back in.

## 2020-07-06 ENCOUNTER — Ambulatory Visit: Payer: Self-pay

## 2020-07-06 NOTE — Telephone Encounter (Signed)
Patient called stating that she was [redacted] weeks pregnant. She has experienced leaking of fluid via the vaginal area.  She states that it started about 2 days ago. She states she has no pain but a lot of pressure. She is G1P0 a Geophysical data processor. She is EDD 10/09/20.  She states she feels the baby moving but not as active.Per protocol patient will go to LD for evaluation of her symptoms. Care advice read to patient.  She verbalized understanding of all information.  Reason for Disposition . Baby moving less today (e.g., kick count < 5 in 1 hour or < 10 in 2 hours)  Answer Assessment - Initial Assessment Questions 1. ONSET: "When did the symptoms begin?"        2 days ago 2. CONTRACTIONS: "Are you having any contractions?" If yes, ask: "Describe the contractions that you are having." (e.g., duration, frequency, regularity, severity)     pressure 3. EDD: "What date are you expecting to deliver?"    10/09/20 4. PARITY: "Have you had a baby before?" If Yes, ask: "How long did the labor last?"    No 5. FETAL MOVEMENT: "Has the baby's movement decreased or changed significantly from normal?"     Moving  6. OTHER SYMPTOMS: "Do you have any other symptoms?" (e.g., abdominal pain, vaginal bleeding, fever, hand/facial swelling)   Pressure,no bleeding hand and feet swelling  Protocols used: PREGNANCY - RUPTURE OF [facility]

## 2020-07-07 ENCOUNTER — Other Ambulatory Visit: Payer: Self-pay

## 2020-07-07 ENCOUNTER — Encounter: Payer: Self-pay | Admitting: Physical Therapy

## 2020-07-07 ENCOUNTER — Ambulatory Visit: Payer: BC Managed Care – PPO | Attending: Obstetrics and Gynecology | Admitting: Physical Therapy

## 2020-07-07 DIAGNOSIS — M6281 Muscle weakness (generalized): Secondary | ICD-10-CM

## 2020-07-07 DIAGNOSIS — R293 Abnormal posture: Secondary | ICD-10-CM

## 2020-07-07 DIAGNOSIS — R2689 Other abnormalities of gait and mobility: Secondary | ICD-10-CM | POA: Insufficient documentation

## 2020-07-07 NOTE — Patient Instructions (Addendum)
Lengthen Back rib by _ shoulder    Lie on   side , pillow between knees  Pull  arm overhead over mattress, grab the edge of mattress,pull it upward, drawing elbow away from ears  Breathing 15 reps   Open book (handout)  Lying on  _ side , rotating  __ only this week  15 reps  ___    Avoid straining pelvic floor, abdominal muscles , spine  Use log rolling technique instead of getting out of bed with your neck or the sit-up     Log rolling into and out of bed   Log rolling into and out of bed If getting out of bed on R side, Bent knees, scoot hips/ shoulder to L  Raise R arm completely overhead, rolling onto armpit  Then lower bent knees to bed to get into complete side lying position  Then drop legs off bed, and push up onto R elbow/forearm, and use L hand to push onto the bed   __  Scooting in bed with elbows and heels pressed , exhale to scoot hips small increments     ---   Proper body mechanics with getting out of a chair to decrease strain  on back &pelvic floor   Avoid holding your breath when Getting out of the chair:  Scoot to front part of chair chair Heels behind feet, feet are hip width apart, nose over toes  Inhale like you are smelling roses Exhale to stand    __  Wear SIJ belt for more support

## 2020-07-08 NOTE — Telephone Encounter (Signed)
Patient called in stating that she hasn't heard anything back from went she last called. Placed patient on hold to speak with nurse. The nurse informed me to tell the patient that her provider is not in the office at the moment and that once she is in the nurse will have a talk with her to see if they can better assist the patient or give her any additional advise. Patient verbalized understanding.

## 2020-07-08 NOTE — Therapy (Addendum)
[facility] MAIN [facility] SERVICES [address] [place], [place], [address] Phone: [phone]   Fax:  [phone]  Physical Therapy Evaluation  Patient Details  Name: Laura Hernandez MRN: [id number] Date of Birth: 05/13/1991 Referring Provider (PT): Valentino Saxon    Encounter Date: 07/07/2020   PT End of Session - 07/07/20 1004    Visit Number 1    Number of Visits 10    Date for PT Re-Evaluation 09/15/20    PT Start Time 1000    PT Stop Time 1100    PT Time Calculation (min) 60 min           Past Medical History:  Diagnosis Date  . Bronchitis   . Chlamydia   . Migraines 2012  . Trichomonas     Past Surgical History:  Procedure Laterality Date  . NO PAST SURGERIES      There were no vitals filed for this visit.    Subjective Assessment - 07/07/20 1005    Subjective 1) Low back pain:  Pt is [redacted] weeks pregnant with her first child. Pt noticed stabbing in her back when walking and it goes down the front thighs with mm spasms.  Radiating pain decreases within 20 min.    2) Mm spasms comes and goes all day occuring at neck, shoulders, back and legs. These Sx have been going for the past 3 weeks but it has become critical the past 2 weeks. Pt is not able to lay on her both side for sleeping. Pain occurs / worsens with climbing steps  ( no steps at home), driving car,  getting in/out of car, bending and lifting boxes at work, standing for 1 hour. Pt works at [employer] which requires bending and lifting heavy boxes . Pt feels like her body is not able to continue to work.    3) urinary leakage occurs sometimes when she wipes. Denied SUI, constipation.    4) B ankle swelling since the pregnancy. Hx of  L ankle sprain after a fall in January 2021. It sometimes swells. Pt did not go through PT but wore a  boot for 4 weeks.     Pertinent History Hx of migraines, worsened with pregnancy. Gym workout prior to pregnancy: treadmill, back,legs machines.  Pt performed sit up and crunches.    Currently in Pain? Yes    Pain Score 8     Pain Location Shoulder              [facility] PT Assessment - 07/07/20 1014      Assessment   Medical Diagnosis back pain afffecting pregnancy     Referring Provider (PT) Cherry       Precautions   Precautions Other (comment)   pregnant      Restrictions   Weight Bearing Restrictions No      Balance Screen   Has the patient fallen in the past 6 months No      AROM   Overall AROM Comments L sideflexion/ rotation with LBP        Strength   Overall Strength Comments R knee flex/ext 4-/5, L 5/5  , hip flexion B 5/5        Palpation   Spinal mobility dowagers hump at C/T     SI assessment  L iliac crest higher than R,     Palpation comment tightness along IT band/ quad L, glut med,  hypomobile upper T segments  Ambulation/Gait   Gait velocity 0.8 m/s pre Tx     Gait Comments L pelvic sway  . slight hip hike                       Objective measurements completed on examination: See above findings.       [facility] Adult PT Treatment/Exercise - 07/07/20 1014      Bed Mobility   Bed Mobility --   poor technique,long sitting, breathholding w/ rolling     Therapeutic Activites    Therapeutic Activities --   body mechanics with bed t/f      Neuro Re-ed    Neuro Re-ed Details  cued for body mechanics to minimize straing to spine and pelvis       Modalities   Modalities Moist Heat      Moist Heat Therapy   Number Minutes Moist Heat 5 Minutes    Moist Heat Location --   neck durign administration of FOTO     Manual Therapy   Manual therapy comments STM/MWM to address tightness along IT band/ quad L, glut med,  hypomobile upper T segments                        PT Long Term Goals - 07/07/20 1009      PT LONG TERM GOAL #1   Title climbing steps  ( no steps at home), driving car,  getting in/out of car, bending and lifting boxes at work, standing for 1 hour.                   Plan - 07/07/20 1005    Clinical Impression Statement  Pt is a  29 yo who presents with low back pain, mm spasms over medial thigh, urinary leakage, and B ankle swelling. Pt is [redacted] weeks pregnant with her 1st child. Pt works at [employer] and performs bending and lifting of boxes.   Pt's musculoskeletal assessment revealed  limited spinal /pelvic mobility, unequal iliac crests,  dyscoordination and strength of pelvic floor mm, weak hip weakness, poor body mechanics which places strain on the abdominal/pelvic floor mm.   These are deficits that indicate an ineffective intraabdominal pressure system associated with increased risk for pt's Sx.   Pt was provided education on etiology of Sx with anatomy, physiology explanation with images along with the benefits of customized pelvic PT Tx based on pt's medical conditions and musculoskeletal deficits.  Explained the physiology of deep core mm coordination and roles of pelvic floor function in urination, defecation, sexual function, and postural control with deep core mm system.    Following Tx today which pt tolerated without complaints, pt demo'd equal alignment of pelvic girdle and increased spinal mobility.  Pt would benefit from skilled PT. 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      Examination-Activity Limitations Sleep;Stairs;Stand;Lift;Transfers;Bed Mobility    Stability/Clinical Decision Making Evolving/Moderate complexity    Clinical Decision Making Moderate    Rehab Potential Good    PT Frequency 1x / week    PT Duration Other (comment)   10   PT Treatment/Interventions Patient/family education;Functional mobility training;Therapeutic activities;Moist Heat;Therapeutic exercise;Neuromuscular re-education;Manual techniques;Taping;Balance training;Manual lymph drainage;Cryotherapy    Consulted and Agree with Plan of Care Patient             Patient will benefit from skilled therapeutic intervention  in order to improve the following deficits and impairments:  Decreased coordination, Decreased balance, Decreased mobility, Decreased strength, Postural dysfunction, Decreased endurance, Difficulty walking, Increased muscle spasms, Hypomobility, Improper body mechanics, Pain  Visit Diagnosis: Other abnormalities of gait and mobility  Abnormal posture  Muscle weakness (generalized)     Problem List Patient Active Problem List   Diagnosis Date Noted  . Situational mixed anxiety and depressive disorder 07/01/2020  . Back pain affecting pregnancy, antepartum 07/01/2020  . Trichomonas infection 06/05/2020  . Muscle pain 06/05/2020  . Vaginal bleeding in pregnancy, second trimester 06/05/2020  . Urinary tract colonization by group B Streptococcus affecting pregnancy 03/31/2020  . Supervision of low-risk first pregnancy 12/06/2013  . Chlamydia 09/18/2013  . History of trichomonal vaginitis 09/18/2013  . History of gonorrhea 09/18/2013  . HSV-2 (herpes simplex virus 2) infection 09/18/2013    Mariane Masters ,PT, DPT, E-RYT  07/08/2020, 1:48 PM  [facility] MAIN [facility] SERVICES [address] [place], [place], [address] Phone: [phone]   Fax:  [phone]  Name: Laura Hernandez MRN: [id number] Date of Birth: 01-16-1991

## 2020-07-09 NOTE — Telephone Encounter (Signed)
Patient called in and stated that she hasn't got a call back from the nurse. I put the patient on hold and called back to [facility]. I told Marylu Lund what the patients said and [name] said that she talk to Dr. Valentino Saxon and they need a number to the department that needs the information on the patients absences from work. I got back on the phone with Mrs. Falkenstein and told her what the nurse needs for the next step. Mrs Millar gave me telephone number [phone] ext [phone] the lady's name is Pearletha Furl. The patient did say that she didn't want the nurse or Dr. Valentino Saxon, whoever will be talking to Laurelyn Sickle to get into a lot of detail on her health because she has a Clinical research associate and it is under investigation. The patient said that she got a letter in the mail requesting reasoning on her work absences and it is requesting it to be competed by 07/10/2020. I told the pt I will send this information to the nurse and that she will be in touch the patient verbally understood. Please advise

## 2020-07-10 NOTE — Telephone Encounter (Addendum)
Spoke to pt to get more information concerning what was needed. Pt stated that she needed a letter/ work excuse for each day she was out starting back on Mar 19, 2020. Pt was informed that we can only write work excuses for days she was seen in the office. Pt stated that the letter stated that dates that she needed. Pt was asked to send the letter via mychart. Pt stated that the letter/work excuses needed to be completed and sent in by the end of today or she may not have a job. Pt was informed that I would look into the information and try to call her HR officer Laurelyn Sickle again to see what information is needed.

## 2020-07-10 NOTE — Telephone Encounter (Signed)
Patient called in requesting an update on her previous requests. Informed her that they were working on it and that she provided the telephone number just yesterday and to allow some time for her provider to get this completed for her. Patient verbalized understanding.

## 2020-07-10 NOTE — Telephone Encounter (Signed)
Called number [phone] as provided by patient. Was on hold for 20 minutes and no one picked up and then the line dropped.

## 2020-07-10 NOTE — Telephone Encounter (Signed)
Katina from HR called concerning the pt and what information is needed to get coverage. No answer LM via VM, with name number of office. also informed Laurelyn Sickle that I was leaving at 2pm and to please address CM or SS. When she returned call.

## 2020-07-10 NOTE — Telephone Encounter (Signed)
Called number given by pt and was routed to another line and number to speak to Ruby number [phone]. Ruby gave me information on pt and they were still reviewing her case number. Ruby gave me number to Magdalen Spatz who I was tryign to get in contact with but the number given did not have an ext option. Boneta Lucks at [phone] no answer LM via VM my name office name/number and reason for my call.

## 2020-07-13 ENCOUNTER — Other Ambulatory Visit: Payer: BC Managed Care – PPO

## 2020-07-13 ENCOUNTER — Encounter: Payer: BC Managed Care – PPO | Admitting: Certified Nurse Midwife

## 2020-07-14 ENCOUNTER — Ambulatory Visit: Payer: BC Managed Care – PPO | Admitting: Physical Therapy

## 2020-07-14 NOTE — Addendum Note (Signed)
Addended by: Mariane Masters on: 07/14/2020 10:40 AM   Modules accepted: Orders

## 2020-07-15 ENCOUNTER — Telehealth: Payer: Self-pay | Admitting: Obstetrics and Gynecology

## 2020-07-15 ENCOUNTER — Other Ambulatory Visit: Payer: BC Managed Care – PPO

## 2020-07-15 ENCOUNTER — Telehealth: Payer: Self-pay | Admitting: Certified Nurse Midwife

## 2020-07-15 ENCOUNTER — Encounter: Payer: BC Managed Care – PPO | Admitting: Certified Nurse Midwife

## 2020-07-15 NOTE — Telephone Encounter (Signed)
Pt called in and stated that she isnt feeling well the pt wanted to move her appt to another day. We moved it to Monday but if the pt isnt feeling better they are to call us. The pt said they think they have a fever. I made a note to call them Friday to check in

## 2020-07-15 NOTE — Telephone Encounter (Signed)
Error

## 2020-07-16 ENCOUNTER — Telehealth: Payer: Self-pay

## 2020-07-16 ENCOUNTER — Telehealth: Payer: Self-pay | Admitting: Certified Nurse Midwife

## 2020-07-16 NOTE — Telephone Encounter (Signed)
Returned patients call- advised her to refer to the OTC sheet provided at nurse intake. She is c/o slight non productive cough, stuffy nose and slight sore throat. Advised to use vaporizer and tylenol as needed. Patient was encouraged to seek urgent care if she developed a fever or worsening symptoms. Patient was in agreement. Also encouraged to stay well hydrated.

## 2020-07-16 NOTE — Telephone Encounter (Signed)
Pt aware we do NOT accept [employer]. Advised to contact medicaid to get it switched to bcbs, uhc, or wellcare. Pt states she will contact her Child psychotherapist. Website also given.

## 2020-07-16 NOTE — Telephone Encounter (Signed)
The pt called in and stated that she was returning a call from Crystal about her OB payments. The pt had an appointment yesterday and she called in because she wasn't feeling well. The pt said yesterday she had a fever and we moved her appointment to Monday. Today when the pt called in she said she doesn't have a fever anymore but she would like call from the nurse. The pt is requesting a possible medication prescription or something she can take over the counter. I told the pt I will send a message to Crystal was well as to the nurse. The pt verbally understood. Please advise

## 2020-07-16 NOTE — Telephone Encounter (Signed)
LMTRC  Pt has not made an OB payment.   OB plan was signed on 6/14.

## 2020-07-16 NOTE — Telephone Encounter (Signed)
Voicemail message left for patient regarding her telephone message. If she chooses to call back she may ask for Crystal.

## 2020-07-16 NOTE — Telephone Encounter (Signed)
Pt was returning your call. The pt said that she has [employer] cross [place] amd medicaid. I asked the pt what medicaid she has and she read the card off to me. The medicaid that she has we don't take [employer] [place] and  I told her that it E-Verified in our system. The pt said she will send a mychart message of a picture of card. I told her that was fine but I will send a message to Crystal that you were returning her call. The pt verbally understood. Please advise

## 2020-07-17 ENCOUNTER — Other Ambulatory Visit: Payer: BC Managed Care – PPO

## 2020-07-20 ENCOUNTER — Encounter: Payer: Self-pay | Admitting: Certified Nurse Midwife

## 2020-07-20 ENCOUNTER — Other Ambulatory Visit: Payer: Self-pay

## 2020-07-20 ENCOUNTER — Ambulatory Visit (INDEPENDENT_AMBULATORY_CARE_PROVIDER_SITE_OTHER): Payer: BC Managed Care – PPO | Admitting: Certified Nurse Midwife

## 2020-07-20 ENCOUNTER — Other Ambulatory Visit: Payer: BC Managed Care – PPO

## 2020-07-20 VITALS — BP 100/62 | HR 87 | Wt 201.1 lb

## 2020-07-20 DIAGNOSIS — Z3A28 28 weeks gestation of pregnancy: Secondary | ICD-10-CM

## 2020-07-20 DIAGNOSIS — Z23 Encounter for immunization: Secondary | ICD-10-CM

## 2020-07-20 LAB — POCT URINALYSIS DIPSTICK OB
Bilirubin, UA: NEGATIVE
Blood, UA: NEGATIVE
Glucose, UA: NEGATIVE
Ketones, UA: NEGATIVE
Leukocytes, UA: NEGATIVE
Nitrite, UA: NEGATIVE
POC,PROTEIN,UA: NEGATIVE
Spec Grav, UA: >=1.03 — AB (ref 1.010–1.025)
Urobilinogen, UA: 0.2 E.U./dL
pH, UA: 5 (ref 5.0–8.0)

## 2020-07-20 MED ORDER — TETANUS-DIPHTH-ACELL PERTUSSIS 5-2.5-18.5 LF-MCG/0.5 IM SUSP
0.5000 mL | Freq: Once | INTRAMUSCULAR | Status: AC
  Administered 2020-07-20: 0.5 mL via INTRAMUSCULAR

## 2020-07-20 MED ORDER — TETANUS-DIPHTH-ACELL PERTUSSIS 5-2.5-18.5 LF-MCG/0.5 IM SUSP: 0.5000 mL | Freq: Once | INTRAMUSCULAR | Status: DC

## 2020-07-20 NOTE — Addendum Note (Signed)
Addended by: Brooke Dare on: 07/20/2020 09:47 AM   Modules accepted: Orders

## 2020-07-20 NOTE — Progress Notes (Signed)
ROB doing well. Pt presents to midwife side for consult on water birth. Information given on classes, ACNM & ACOG position statement, consent form given , class infromation and Doula info. given. Pt encouraged to take the water birth class and to let [place] know if she would like to proceed with water birth . She state she has done some research and is pretty sure she want to have a water birth. Discussed normal musculoskeletal discomforts of pregnancy. Reassurance given, recommend use of belly band. Glucose screen, cbc,rpr today. Pt refused Tdap,flu, and blood transfusion consent. She state that she would not get blood in a life saving event. She feels good fetal movement. Discussed follow up in 2 wks with MD or Midwife( if transferring her care for water birth). She agrees to plan.   Doreene Burke, CNM

## 2020-07-20 NOTE — Patient Instructions (Addendum)
Pregnancy and Influenza  Influenza, also called the flu, is an infection of the lungs and airways (respiratory tract). If you are pregnant, you are more likely to catch the flu. You are also more likely to have a more serious case of the flu. This is because pregnancy causes changes to your body's disease-fighting system (immune system), heart, and lungs. If you develop a bad case of the flu, especially with a high fever, this can cause problems for you and your developing baby. How do people get the flu? The flu is caused by a type of germ called a virus. It spreads when virus particles get passed from person to person by:  Being near a sick person who is coughing or sneezing.  Touching something that has the virus on it and then touching your mouth, nose, or face. The influenza virus is most common during the fall and winter. How can I protect myself against the flu?  Get a flu shot. The best way to prevent the flu is to get a flu shot before flu season starts. The flu shot is not dangerous for your developing baby. It may even help protect your baby from the flu for up to 6 months after birth.  Wash your hands often with soap and warm water. If soap and water are not available, use hand sanitizer.  Do not come in close contact with sick people.  Do not share food, drinks, or utensils with other people.  Avoid touching your eyes, nose, and mouth.  Clean frequently used surfaces at home, school, or work.  Practice healthy lifestyle habits, such as: ? Eating a healthy, balanced diet. ? Drinking plenty of fluids. ? Exercising regularly or as told by your health care provider. ? Sleeping 7-9 hours each night. ? Finding ways to manage stress. What should I do if I have flu symptoms?  If you have any symptoms of the flu, even after getting a flu shot, contact your health care provider right away.  To reduce fever, take over-the-counter acetaminophen as told by your health care  provider.  If you have the flu, you may get antiviral medicine to keep the flu from becoming severe and to shorten how long it lasts.  Avoid spreading the flu to others: ? Stay home until you are well. ? Cover your nose and mouth when you cough or sneeze. ? Wash your hands often. Follow these instructions at home:  Take over-the-counter and prescription medicines only as told by your health care provider. Do not take any medicine, including cold or flu medicine, unless your health care provider tells you to do so.  If you were prescribed antiviral medicine, take it as told by your health care provider. Do not stop taking the antiviral medicine even if you start to feel better.  Eat a nutrient-rich diet that includes fresh fruits and vegetables, whole grains, lean protein, and low-fat dairy.  Drink enough fluid to keep your urine clear or pale yellow.  Get plenty of rest. Contact a health care provider if:  You have fever or chills.  You have a cough, sore throat, or stuffy nose.  You have worsening or unusual: ? Muscle aches. ? Headache. ? Tiredness. ? Loss of appetite.  You have vomiting or diarrhea. Get help right away if:  You have trouble breathing.  You have chest pain.  You have abdominal pain.  You begin to have labor pains.  You have a fever that does not go down 24 hours  after you take medicine.  You do not feel your baby move.  You have diarrhea or vomiting that will not go away.  You have dizziness or confusion.  Your symptoms do not improve, even with treatment. Summary  If you are pregnant, you are more likely to catch the flu. You are also more likely to have a more serious case of the flu.  If you have flu-like symptoms, call your health care provider right away. If you develop a bad case of the flu, especially with a high fever, this can be dangerous for your developing baby.  The best way to prevent the flu is to get a flu shot before flu  season starts. The flu shot is not dangerous for your developing baby.  If you have the flu and were prescribed antiviral medicine, take it as told by your health care provider. This information is not intended to replace advice given to you by your health care provider. Make sure you discuss any questions you have with your health care provider. Document Revised: 02/01/2019 Document Reviewed: 12/06/2016 Elsevier Patient Education  2020 [employer].    Tdap (Tetanus, Diphtheria, Pertussis) Vaccine: What You Need to Know 1. Why get vaccinated? Tdap vaccine can prevent tetanus, diphtheria, and pertussis. Diphtheria and pertussis spread from person to person. Tetanus enters the body through cuts or wounds.  TETANUS (T) causes painful stiffening of the muscles. Tetanus can lead to serious health problems, including being unable to open the mouth, having trouble swallowing and breathing, or death.  DIPHTHERIA (D) can lead to difficulty breathing, heart failure, paralysis, or death.  PERTUSSIS (aP), also known as "whooping cough," can cause uncontrollable, violent coughing which makes it hard to breathe, eat, or drink. Pertussis can be extremely serious in babies and young children, causing pneumonia, convulsions, brain damage, or death. In teens and adults, it can cause weight loss, loss of bladder control, passing out, and rib fractures from severe coughing. 2. Tdap vaccine Tdap is only for children 7 years and older, adolescents, and adults.  Adolescents should receive a single dose of Tdap, preferably at age 41 or 12 years. Pregnant women should get a dose of Tdap during every pregnancy, to protect the newborn from pertussis. Infants are most at risk for severe, life-threatening complications from pertussis. Adults who have never received Tdap should get a dose of Tdap. Also, adults should receive a booster dose every 10 years, or earlier in the case of a severe and dirty wound or burn.  Booster doses can be either Tdap or Td (a different vaccine that protects against tetanus and diphtheria but not pertussis). Tdap may be given at the same time as other vaccines. 3. Talk with your health care provider Tell your vaccine provider if the person getting the vaccine:  Has had an allergic reaction after a previous dose of any vaccine that protects against tetanus, diphtheria, or pertussis, or has any severe, life-threatening allergies.  Has had a coma, decreased level of consciousness, or prolonged seizures within 7 days after a previous dose of any pertussis vaccine (DTP, DTaP, or Tdap).  Has seizures or another nervous system problem.  Has ever had Guillain-Barr Syndrome (also called GBS).  Has had severe pain or swelling after a previous dose of any vaccine that protects against tetanus or diphtheria. In some cases, your health care provider may decide to postpone Tdap vaccination to a future visit.  People with minor illnesses, such as a cold, may be vaccinated. People who  are moderately or severely ill should usually wait until they recover before getting Tdap vaccine.  Your health care provider can give you more information. 4. Risks of a vaccine reaction  Pain, redness, or swelling where the shot was given, mild fever, headache, feeling tired, and nausea, vomiting, diarrhea, or stomachache sometimes happen after Tdap vaccine. People sometimes faint after medical procedures, including vaccination. Tell your provider if you feel dizzy or have vision changes or ringing in the ears.  As with any medicine, there is a very remote chance of a vaccine causing a severe allergic reaction, other serious injury, or death. 5. What if there is a serious problem? An allergic reaction could occur after the vaccinated person leaves the clinic. If you see signs of a severe allergic reaction (hives, swelling of the face and throat, difficulty breathing, a fast heartbeat, dizziness, or  weakness), call 9-1-1 and get the person to the nearest hospital. For other signs that concern you, call your health care provider.  Adverse reactions should be reported to the Vaccine Adverse Event Reporting System (VAERS). Your health care provider will usually file this report, or you can do it yourself. Visit the VAERS website at www.vaers.LAgents.no or call 716 282 3851. VAERS is only for reporting reactions, and VAERS staff do not give medical advice. 6. The National Vaccine Injury Compensation Program The [employer] Vaccine Injury Compensation Program (VICP) is a federal program that was created to compensate people who may have been injured by certain vaccines. Visit the VICP website at SpiritualWord.at or call (305) 642-0724 to learn about the program and about filing a claim. There is a time limit to file a claim for compensation. 7. How can I learn more?  Ask your health care provider.  Call your local or state health department.  Contact the Centers for Disease Control and Prevention (CDC): ? Call (469)830-5383 (1-800-CDC-INFO) or ? Visit CDC's website at PicCapture.uy Vaccine Information Statement Tdap (Tetanus, Diphtheria, Pertussis) Vaccine (01/23/2019) This information is not intended to replace advice given to you by your health care provider. Make sure you discuss any questions you have with your health care provider. Document Revised: 02/01/2019 Document Reviewed: 02/04/2019 Elsevier Patient Education  2020 Elsevier Inc. Glucose Tolerance Test During Pregnancy Why am I having this test? The glucose tolerance test (GTT) is done to check how your body processes sugar (glucose). This is one of several tests used to diagnose diabetes that develops during pregnancy (gestational diabetes mellitus). Gestational diabetes is a temporary form of diabetes that some women develop during pregnancy. It usually occurs during the second trimester of pregnancy and goes  away after delivery. Testing (screening) for gestational diabetes usually occurs between 24 and 28 weeks of pregnancy. You may have the GTT test after having a 1-hour glucose screening test if the results from that test indicate that you may have gestational diabetes. You may also have this test if:  You have a history of gestational diabetes.  You have a history of giving birth to very large babies or have experienced repeated fetal loss (stillbirth).  You have signs and symptoms of diabetes, such as: ? Changes in your vision. ? Tingling or numbness in your hands or feet. ? Changes in hunger, thirst, and urination that are not otherwise explained by your pregnancy. What is being tested? This test measures the amount of glucose in your blood at different times during a period of 3 hours. This indicates how well your body is able to process glucose. What kind of sample  is taken?  Blood samples are required for this test. They are usually collected by inserting a needle into a blood vessel. How do I prepare for this test?  For 3 days before your test, eat normally. Have plenty of carbohydrate-rich foods.  Follow instructions from your health care provider about: ? Eating or drinking restrictions on the day of the test. You may be asked to not eat or drink anything other than water (fast) starting 8-10 hours before the test. ? Changing or stopping your regular medicines. Some medicines may interfere with this test. Tell a health care provider about:  All medicines you are taking, including vitamins, herbs, eye drops, creams, and over-the-counter medicines.  Any blood disorders you have.  Any surgeries you have had.  Any medical conditions you have. What happens during the test? First, your blood glucose will be measured. This is referred to as your fasting blood glucose, since you fasted before the test. Then, you will drink a glucose solution that contains a certain amount of glucose.  Your blood glucose will be measured again 1, 2, and 3 hours after drinking the solution. This test takes about 3 hours to complete. You will need to stay at the testing location during this time. During the testing period:  Do not eat or drink anything other than the glucose solution.  Do not exercise.  Do not use any products that contain nicotine or tobacco, such as cigarettes and e-cigarettes. If you need help stopping, ask your health care provider. The testing procedure may vary among health care providers and hospitals. How are the results reported? Your results will be reported as milligrams of glucose per deciliter of blood (mg/dL) or millimoles per liter (mmol/L). Your health care provider will compare your results to normal ranges that were established after testing a large group of people (reference ranges). Reference ranges may vary among labs and hospitals. For this test, common reference ranges are:  Fasting: less than 95-105 mg/dL (0.9-8.15.3-5.8 mmol/L).  1 hour after drinking glucose: less than 180-190 mg/dL (19.1-47.810.0-10.5 mmol/L).  2 hours after drinking glucose: less than 155-165 mg/dL (2.9-5.68.6-9.2 mmol/L).  3 hours after drinking glucose: 140-145 mg/dL (2.1-3.07.8-8.1 mmol/L). What do the results mean? Results within reference ranges are considered normal, meaning that your glucose levels are well-controlled. If two or more of your blood glucose levels are high, you may be diagnosed with gestational diabetes. If only one level is high, your health care provider may suggest repeat testing or other tests to confirm a diagnosis. Talk with your health care provider about what your results mean. Questions to ask your health care provider Ask your health care provider, or the department that is doing the test:  When will my results be ready?  How will I get my results?  What are my treatment options?  What other tests do I need?  What are my next steps? Summary  The glucose tolerance test  (GTT) is one of several tests used to diagnose diabetes that develops during pregnancy (gestational diabetes mellitus). Gestational diabetes is a temporary form of diabetes that some women develop during pregnancy.  You may have the GTT test after having a 1-hour glucose screening test if the results from that test indicate that you may have gestational diabetes. You may also have this test if you have any symptoms or risk factors for gestational diabetes.  Talk with your health care provider about what your results mean. This information is not intended to replace advice given  to you by your health care provider. Make sure you discuss any questions you have with your health care provider. Document Revised: 01/31/2019 Document Reviewed: 05/22/2017 Elsevier Patient Education  2020 [employer].

## 2020-07-20 NOTE — Addendum Note (Signed)
Addended by: Brooke Dare on: 07/20/2020 10:21 AM   Modules accepted: Orders

## 2020-07-21 ENCOUNTER — Other Ambulatory Visit: Payer: Self-pay | Admitting: Certified Nurse Midwife

## 2020-07-21 ENCOUNTER — Ambulatory Visit: Payer: BC Managed Care – PPO | Admitting: Physical Therapy

## 2020-07-21 ENCOUNTER — Telehealth: Payer: Self-pay

## 2020-07-21 DIAGNOSIS — R2689 Other abnormalities of gait and mobility: Secondary | ICD-10-CM

## 2020-07-21 DIAGNOSIS — M6281 Muscle weakness (generalized): Secondary | ICD-10-CM

## 2020-07-21 DIAGNOSIS — R293 Abnormal posture: Secondary | ICD-10-CM

## 2020-07-21 DIAGNOSIS — O99810 Abnormal glucose complicating pregnancy: Secondary | ICD-10-CM

## 2020-07-21 LAB — CBC
Hematocrit: 32.8 % — ABNORMAL LOW (ref 34.0–46.6)
Hemoglobin: 10.1 g/dL — ABNORMAL LOW (ref 11.1–15.9)
MCH: 21.5 pg — ABNORMAL LOW (ref 26.6–33.0)
MCHC: 30.8 g/dL — ABNORMAL LOW (ref 31.5–35.7)
MCV: 70 fL — ABNORMAL LOW (ref 79–97)
Platelets: 257 10*3/uL (ref 150–450)
RBC: 4.7 x10E6/uL (ref 3.77–5.28)
RDW: 15.7 % — ABNORMAL HIGH (ref 11.7–15.4)
WBC: 11.4 10*3/uL — ABNORMAL HIGH (ref 3.4–10.8)

## 2020-07-21 LAB — GLUCOSE, 1 HOUR GESTATIONAL: Gestational Diabetes Screen: 174 mg/dL — ABNORMAL HIGH (ref 65–139)

## 2020-07-21 LAB — RPR: RPR Ser Ql: NONREACTIVE

## 2020-07-21 NOTE — Telephone Encounter (Signed)
Pt came into the practice and said that she had an appt at her physical therapy with Mariane Masters today and that they told her they have sent a work note to dr. Valentino Saxon a couple of weeks ago. The pt was wanting to know why she hasn't heard anything about it. I told the pt that I will send a message to the nurse and that the nurse is out of the office so she will be in touch later this week. The pt is requesting a call.  Please advise

## 2020-07-21 NOTE — Therapy (Addendum)
[facility] MAIN [facility] SERVICES [address] [place], [place], [address] Phone: [phone]   Fax:  [phone]  Physical Therapy Treatment  Patient Details  Name: Laura Hernandez MRN: [id number] Date of Birth: 1990-12-21 Referring Provider (PT): Valentino Saxon    Encounter Date: 07/21/2020   PT End of Session - 07/21/20 1356    Visit Number 2    Number of Visits 10    Date for PT Re-Evaluation 09/15/20    PT Start Time 1000    PT Stop Time 1100    PT Time Calculation (min) 60 min    Activity Tolerance Patient tolerated treatment well    Behavior During Therapy [facility] for tasks assessed/performed           Past Medical History:  Diagnosis Date  . Bronchitis   . Chlamydia   . Migraines 2012  . Trichomonas     Past Surgical History:  Procedure Laterality Date  . NO PAST SURGERIES      There were no vitals filed for this visit.   Subjective Assessment - 07/21/20 1004    Subjective Pt reported a decrease from 9/10 to 7/10 in her hips. Getting out of a car is still hurting. Pt uses a pillow to try and support her. Last night, pt had trouble sleeping with pain down from L hip to knee when lying on the L side and when laying R side, shooting pain down R LE.    Pertinent History Hx of migraines, worsened with pregnancy. Gym workout prior to pregnancy: treadmill, back,legs machines. Pt performed sit up and crunches.              [facility] PT Assessment - 07/21/20 1005      AROM   Overall AROM Comments slight pain with L rotation,       Strength   Overall Strength Comments L hip abd 3/5, R 4+/5       Palpation   SI assessment  L iliac crest slightly higher     Palpation comment tenderness / tigthness at lateral tight/ glut med/ IT band/ quad       Bed Mobility   Bed Mobility --   B sidelying without pain, ~20 min w/o pain post Tx                        [facility] Adult PT Treatment/Exercise - 07/21/20 1005      Ambulation/Gait    Gait velocity 0.9 m/s post Tx      Neuro Re-ed    Neuro Re-ed Details  cued for new HEP       Modalities   Modalities Moist Heat      Moist Heat Therapy   Number Minutes Moist Heat 5 Minutes    Moist Heat Location --   L lateral hip      Manual Therapy   Manual therapy comments sidelying Tx 2/2 pregnancy:  STMMWM to address tenderness / tigthness at lateral tight/ glut med/ IT band/ quad,  rotational mob at SIJ to promote nutation                         PT Long Term Goals - 07/07/20 1009      PT LONG TERM GOAL #1   Title Pt will climbing steps  ( no steps at home), driving car,  getting in/out of car, bending and lifting boxes at work, standing for  1 hour.    Time 10    Period Weeks    Status New    Target Date 09/18/20      PT LONG TERM GOAL #2   Title Pt will decreased FOTO PFDI score from 29 pts to < 15 pts in order torestore pelvic floor function    Time 8    Period Weeks    Status New    Target Date 09/04/20      PT LONG TERM GOAL #3   Title Pt will increase her FOTO score from 59 pts to > 70 pts for LBP  in order to return to ADLs    Time 8    Period Weeks    Status New    Target Date 09/04/20      PT LONG TERM GOAL #4   Title Pt will demo equal alignment of pelvic girdle across 2 visits in order to minimize pain with bending    Time 2    Period Weeks    Status New    Target Date 07/28/20                 Plan - 07/21/20 1402    Clinical Impression Statement Pt continues to make progress with gradually decreasing pain and better pelvic girdle alignment . Pt required manual Tx to decrease tightness at L hip/lateral  Thigh and improve SIJ mobility. Sidelying positions were used. Advanced pt to L hip abduction strengthening because this side was weaker. Pt was provided another SIJ belt because pt lost one part to her last belt. Pt's gait speed increased and her pain decreased post Tx. Pt was able to tolerate sidelying positions after Tx and  thus, anticipate pt's sleep will improve. 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 MD had been notified to place a work Mudlogger order for pt. Despite working reduced hours, pt is still required to work the same tasks of  repeated bending and lifting heavy boxes. Therefore, pt would benefit from being out of work to ensure reduced injuries / risks with her pregnancy.      Examination-Activity Limitations Sleep;Stairs;Stand;Lift;Transfers;Bed Mobility    Stability/Clinical Decision Making Evolving/Moderate complexity    Rehab Potential Good    PT Frequency 1x / week    PT Duration Other (comment)   10   PT Treatment/Interventions Patient/family education;Functional mobility training;Therapeutic activities;Moist Heat;Therapeutic exercise;Neuromuscular re-education;Manual techniques;Taping;Balance training;Manual lymph drainage;Cryotherapy    Consulted and Agree with Plan of Care Patient           Patient will benefit from skilled therapeutic intervention in order to improve the following deficits and impairments:  Decreased coordination, Decreased balance, Decreased mobility, Decreased strength, Postural dysfunction, Decreased endurance, Difficulty walking, Increased muscle spasms, Hypomobility, Improper body mechanics, Pain  Visit Diagnosis: Other abnormalities of gait and mobility  Abnormal posture  Muscle weakness (generalized)     Problem List Patient Active Problem List   Diagnosis Date Noted  . Situational mixed anxiety and depressive disorder 07/01/2020  . Back pain affecting pregnancy, antepartum 07/01/2020  . Trichomonas infection 06/05/2020  . Muscle pain 06/05/2020  . Vaginal bleeding in pregnancy, second trimester 06/05/2020  . Urinary tract colonization by group B Streptococcus affecting pregnancy 03/31/2020  . Supervision of low-risk first pregnancy 12/06/2013  . Chlamydia 09/18/2013  .  History of trichomonal vaginitis 09/18/2013  . History of gonorrhea 09/18/2013  . HSV-2 (herpes simplex virus 2)  infection 09/18/2013    Mariane Masters ,PT, DPT, E-RYT  07/21/2020, 2:03 PM  [facility] MAIN [facility] SERVICES [address] [place], [place], [address] Phone: [phone]   Fax:  [phone]  Name: Laura Hernandez MRN: [id number] Date of Birth: [birth year]

## 2020-07-21 NOTE — Patient Instructions (Addendum)
°  Clam Shell 45 Degrees  Lying with hips and knees bent 45, one pillow between knees and ankles. Heel together, toes apart like ballerina,  Lift knee with exhale while pressing heels together. Be sure pelvis does not roll backward. Do not arch back. Do 20 times, L  leg, 2 times per day.     Complimentary stretch:   Cross thigh over thigh seated on bed with feet on bed L knee over R  , Inhale, and exhale twist to L  10 reps  __  Quad stretch , with towel , pulling ankle back while on side   __

## 2020-07-22 ENCOUNTER — Other Ambulatory Visit: Payer: BC Managed Care – PPO

## 2020-07-22 NOTE — Telephone Encounter (Addendum)
Spoke to [place] she informed me that the pt was informed that they would accommodate her request for FMLA and work restriction but pt has not been shown up or coming to work. Laurelyn Sickle stated that pt's last day at work was 06/22/2020 and haven't seen pt since then. Laurelyn Sickle stated they have been trying to contact pt to come to work and have not heard from her at all. Laurelyn Sickle stated that pt is coming and going to work as she wants. Laurelyn Sickle stated that there is work for her with the restriction given in letters provided by doctors. Laurelyn Sickle stated that pt was sent a letter on Monday stating that pt needed information to cover her for the days that she has not worked. Laurelyn Sickle stated that the pt's work schedule is every Saturdays, Sundays, and Mondays. Laurelyn Sickle stated that the pt's last full work days were on 04/11/2020-04/13/2020. Laurelyn Sickle stated that she needed information on if the pt is able or not able to work and if any restrictions please provide that information as well. Laurelyn Sickle needs all the information by Friday, Jul 24, 2020 if possible.

## 2020-07-23 ENCOUNTER — Telehealth: Payer: Self-pay

## 2020-07-23 NOTE — Telephone Encounter (Signed)
Yes, the physical therapist did send a My[employer] staff message to me letting me know her recommendations and that she should be out of work. I sent the message to [place] and asked her to have the patient send in new FMLA forms.  Dr. Valentino Saxon

## 2020-07-23 NOTE — Telephone Encounter (Signed)
Well as I told the patient, I wrote her out for the days that she requested, and if she has missed more days then what she has informed us of then I'm not sure what to do about those as she was supposed to let us know as soon as possible if she missed any going forward. The patient has started physical therapy and I told her as long as it was recommended that she be out for the remainder of her pregnancy I would write her a letter/new FMLA forms from when she started going.

## 2020-07-23 NOTE — Telephone Encounter (Signed)
Pt states she has been trying to get an excuse from work for 2 weeks.  She states that her PT Dr - Marcille Blanco was to discuss with [facility] and then she was to get a note to be excused from work.   She was given restrictions but is still unable to do her job. It is physically and mentally draining. She is aware that [facility] has tried to reach out to [place]- [employer] with her job but has not received a response.   Pt is requesting that we do not give Laurelyn Sickle any information moving forward.   I informed pt that I do not see in Jonna Munro note for her to be excused from work.   Pt aware [facility] and [facility] will receive this message tomorrow. Pt voiced understanding.

## 2020-07-24 ENCOUNTER — Telehealth: Payer: Self-pay

## 2020-07-24 NOTE — Telephone Encounter (Signed)
Pt called no answer LM via VM that I was calling to get more copies of FMLA forms to fill out to cover her for being out of work. Pt was advised via VM to please have the forms sent over to the office or drop them off. Pt is aware via VM that we are trying to get forms completed and faxed to her job before the end of our work day at [employer]. Also sent [employer].

## 2020-07-24 NOTE — Telephone Encounter (Signed)
Patient called in stating her spasms have returned and they are causing severe discomfort.   Could you please advise?

## 2020-07-24 NOTE — Telephone Encounter (Signed)
Please see other phone and mychart encounters.

## 2020-07-24 NOTE — Telephone Encounter (Signed)
Spoke to pt concerning her muscle spasms in her back. Pt was advised to take a warm back use a heating pad or do stretches. Pt was informed that information would be sent to her mychart to help with the muscle spasms. Pt voiced that she understood. Also asked pt about her fmla forms and that we were missing so papers pt stated that she did not know that we needed all of the paper. Pt will be sending papers(forms) via mychart.

## 2020-07-28 ENCOUNTER — Ambulatory Visit: Payer: BC Managed Care – PPO | Admitting: Physical Therapy

## 2020-07-28 ENCOUNTER — Other Ambulatory Visit: Payer: Self-pay

## 2020-07-28 ENCOUNTER — Other Ambulatory Visit: Payer: BC Managed Care – PPO

## 2020-07-28 DIAGNOSIS — O99810 Abnormal glucose complicating pregnancy: Secondary | ICD-10-CM

## 2020-07-29 ENCOUNTER — Telehealth: Payer: Self-pay

## 2020-07-29 ENCOUNTER — Other Ambulatory Visit: Payer: Self-pay | Admitting: Certified Nurse Midwife

## 2020-07-29 DIAGNOSIS — O24419 Gestational diabetes mellitus in pregnancy, unspecified control: Secondary | ICD-10-CM

## 2020-07-29 LAB — GESTATIONAL GLUCOSE TOLERANCE
Glucose, Fasting: 99 mg/dL — ABNORMAL HIGH (ref 65–94)
Glucose, GTT - 1 Hour: 183 mg/dL — ABNORMAL HIGH (ref 65–179)
Glucose, GTT - 2 Hour: 211 mg/dL — ABNORMAL HIGH (ref 65–154)
Glucose, GTT - 3 Hour: 155 mg/dL — ABNORMAL HIGH (ref 65–139)

## 2020-07-29 NOTE — Telephone Encounter (Signed)
Pt is frustrated that we sent the entire note from PT to her job.   PT note was a complete note- PMH was noted of hx of std. Patient states has she know this was going to be sent she would have informed up to not send. This information is embarrassing.   Spoke with [facility] and she states that per PT note it gives the reason she is unable to work. We do not send limited notes but the entire note to support our decision to place her out of work.   Pt is not happy with this answer. She feels like she should have been made aware of what was going to be sent to her job she would have refused.   Apologized to patient. Patient states she may have to take this to the next level.

## 2020-07-29 NOTE — Telephone Encounter (Signed)
Patient called in stating that she received a MyChart message from her provider giving her the test results of her 3 hour glucose test. Patient stated that she replied back to the message and that it was read by someone however no one responded to her. I told the patient that her provider routed her message to the nurse who could help patient with her message. Patient wants her message acknowledged and completed today however I informed her that the nurse is allotted 24-48 hours to get tasks completed as she is in the office caring for other patients. Patient verbalized understanding.

## 2020-07-29 NOTE — Telephone Encounter (Signed)
You have said all that needs to be said. The job required documentation that the patient was indeed ok to be out of work for medical reasons. The office note from the physical therapist was sent which verified the need. The physical therapist did a comprehensive note, and we cannot alter that note as it is a part of her official medical record. End of story.

## 2020-07-29 NOTE — Telephone Encounter (Signed)
mychart message sent to patient

## 2020-07-30 ENCOUNTER — Inpatient Hospital Stay (HOSPITAL_COMMUNITY)
Admission: AD | Admit: 2020-07-30 | Discharge: 2020-07-30 | Disposition: A | Discharge status: Home / Self Care | Payer: BC Managed Care – PPO | Attending: Obstetrics & Gynecology | Admitting: Obstetrics & Gynecology

## 2020-07-30 ENCOUNTER — Other Ambulatory Visit: Payer: Self-pay

## 2020-07-30 ENCOUNTER — Inpatient Hospital Stay (HOSPITAL_BASED_OUTPATIENT_CLINIC_OR_DEPARTMENT_OTHER): Payer: BC Managed Care – PPO

## 2020-07-30 ENCOUNTER — Telehealth: Payer: Self-pay

## 2020-07-30 ENCOUNTER — Encounter (HOSPITAL_COMMUNITY): Payer: Self-pay | Admitting: Obstetrics & Gynecology

## 2020-07-30 DIAGNOSIS — R42 Dizziness and giddiness: Secondary | ICD-10-CM | POA: Diagnosis not present

## 2020-07-30 DIAGNOSIS — O24419 Gestational diabetes mellitus in pregnancy, unspecified control: Secondary | ICD-10-CM

## 2020-07-30 DIAGNOSIS — O26893 Other specified pregnancy related conditions, third trimester: Secondary | ICD-10-CM | POA: Insufficient documentation

## 2020-07-30 DIAGNOSIS — O368130 Decreased fetal movements, third trimester, not applicable or unspecified: Secondary | ICD-10-CM | POA: Diagnosis present

## 2020-07-30 DIAGNOSIS — R519 Headache, unspecified: Secondary | ICD-10-CM | POA: Diagnosis not present

## 2020-07-30 DIAGNOSIS — W19XXXA Unspecified fall, initial encounter: Secondary | ICD-10-CM

## 2020-07-30 DIAGNOSIS — Z3689 Encounter for other specified antenatal screening: Secondary | ICD-10-CM

## 2020-07-30 DIAGNOSIS — E669 Obesity, unspecified: Secondary | ICD-10-CM

## 2020-07-30 DIAGNOSIS — D649 Anemia, unspecified: Secondary | ICD-10-CM | POA: Diagnosis not present

## 2020-07-30 DIAGNOSIS — O99891 Other specified diseases and conditions complicating pregnancy: Secondary | ICD-10-CM | POA: Diagnosis not present

## 2020-07-30 DIAGNOSIS — O99013 Anemia complicating pregnancy, third trimester: Secondary | ICD-10-CM | POA: Insufficient documentation

## 2020-07-30 DIAGNOSIS — Z3A30 30 weeks gestation of pregnancy: Secondary | ICD-10-CM | POA: Insufficient documentation

## 2020-07-30 DIAGNOSIS — O99213 Obesity complicating pregnancy, third trimester: Secondary | ICD-10-CM | POA: Diagnosis not present

## 2020-07-30 DIAGNOSIS — O368190 Decreased fetal movements, unspecified trimester, not applicable or unspecified: Secondary | ICD-10-CM

## 2020-07-30 HISTORY — DX: Gestational diabetes mellitus in pregnancy, unspecified control: O24.419

## 2020-07-30 LAB — URINALYSIS, ROUTINE W REFLEX MICROSCOPIC
Bilirubin Urine: NEGATIVE
Glucose, UA: NEGATIVE mg/dL
Hgb urine dipstick: NEGATIVE
Ketones, ur: NEGATIVE mg/dL
Nitrite: NEGATIVE
Protein, ur: NEGATIVE mg/dL
Specific Gravity, Urine: 1.013 (ref 1.005–1.030)
pH: 7 (ref 5.0–8.0)

## 2020-07-30 LAB — CBC
HCT: 32.2 % — ABNORMAL LOW (ref 36.0–46.0)
Hemoglobin: 9.9 g/dL — ABNORMAL LOW (ref 12.0–15.0)
MCH: 21.8 pg — ABNORMAL LOW (ref 26.0–34.0)
MCHC: 30.7 g/dL (ref 30.0–36.0)
MCV: 70.9 fL — ABNORMAL LOW (ref 80.0–100.0)
Platelets: 304 10*3/uL (ref 150–400)
RBC: 4.54 MIL/uL (ref 3.87–5.11)
RDW: 16.2 % — ABNORMAL HIGH (ref 11.5–15.5)
WBC: 14.1 10*3/uL — ABNORMAL HIGH (ref 4.0–10.5)
nRBC: 0 % (ref 0.0–0.2)

## 2020-07-30 LAB — COMPREHENSIVE METABOLIC PANEL
ALT: 12 U/L (ref 0–44)
AST: 11 U/L — ABNORMAL LOW (ref 15–41)
Albumin: 2.4 g/dL — ABNORMAL LOW (ref 3.5–5.0)
Alkaline Phosphatase: 102 U/L (ref 38–126)
Anion gap: 10 (ref 5–15)
BUN: 5 mg/dL — ABNORMAL LOW (ref 6–20)
CO2: 19 mmol/L — ABNORMAL LOW (ref 22–32)
Calcium: 9 mg/dL (ref 8.9–10.3)
Chloride: 107 mmol/L (ref 98–111)
Creatinine, Ser: 0.61 mg/dL (ref 0.44–1.00)
GFR calc non Af Amer: >60 mL/min (ref >60)
Glucose, Bld: 102 mg/dL — ABNORMAL HIGH (ref 70–99)
Potassium: 3.8 mmol/L (ref 3.5–5.1)
Sodium: 136 mmol/L (ref 135–145)
Total Bilirubin: 0.4 mg/dL (ref 0.3–1.2)
Total Protein: 6.3 g/dL — ABNORMAL LOW (ref 6.5–8.1)

## 2020-07-30 LAB — PROTEIN / CREATININE RATIO, URINE
Creatinine, Urine: 103.15 mg/dL
Protein Creatinine Ratio: 0.11 mg/mg{Cre} (ref 0.00–0.15)
Total Protein, Urine: 11 mg/dL

## 2020-07-30 MED ORDER — POLYSACCHARIDE IRON COMPLEX 150 MG PO CAPS: 150.0000 mg | ORAL_CAPSULE | Freq: Every day | ORAL | 1 refills | Status: DC

## 2020-07-30 MED ORDER — BUTALBITAL-APAP-CAFFEINE 50-325-40 MG PO TABS
1.0000 | ORAL_TABLET | Freq: Once | ORAL | Status: AC
  Administered 2020-07-30: 1 via ORAL
  Filled 2020-07-30: qty 1

## 2020-07-30 NOTE — Progress Notes (Signed)
Donette Larry CNM in to discuss test results and d/c plan. Written and verbal d/c instructions given and understanding voiced. Discussed fetal kick count, trying prenatal vitamins again (gummies or Flintstones) and adding iron supplement. Pt voices understanding

## 2020-07-30 NOTE — Progress Notes (Signed)
Baby is very active upon return from u/s but pt does not feel FM. States baby moving a lot during u/s and even though she saw FM she could not feel it

## 2020-07-30 NOTE — Telephone Encounter (Signed)
ROB 30 weeks  Pt states she feels like she is going to faint. She feels nauseous.  Even after eating. Very fatigued.   Ha- tylenol helps but it returns  Slight cramps. FM- moves 2 -3 times every few hours.   VB/VD- no  + eating and drinking + hydration  No vomit.   Side note She let me know that she got her FMLA paperwork figured out.

## 2020-07-30 NOTE — Discharge Instructions (Signed)
Fetal Movement Counts Patient Name: ________________________________________________ Patient Due Date: ____________________ What is a fetal movement count?  A fetal movement count is the number of times that you feel your baby move during a certain amount of time. This may also be called a fetal kick count. A fetal movement count is recommended for every pregnant woman. You may be asked to start counting fetal movements as early as week 28 of your pregnancy. Pay attention to when your baby is most active. You may notice your baby's sleep and wake cycles. You may also notice things that make your baby move more. You should do a fetal movement count:  When your baby is normally most active.  At the same time each day. A good time to count movements is while you are resting, after having something to eat and drink. How do I count fetal movements? 1. Find a quiet, comfortable area. Sit, or lie down on your side. 2. Write down the date, the start time and stop time, and the number of movements that you felt between those two times. Take this information with you to your health care visits. 3. Write down your start time when you feel the first movement. 4. Count kicks, flutters, swishes, rolls, and jabs. You should feel at least 10 movements. 5. You may stop counting after you have felt 10 movements, or if you have been counting for 2 hours. Write down the stop time. 6. If you do not feel 10 movements in 2 hours, contact your health care provider for further instructions. Your health care provider may want to do additional tests to assess your baby's well-being. Contact a health care provider if:  You feel fewer than 10 movements in 2 hours.  Your baby is not moving like he or she usually does. Date: ____________ Start time: ____________ Stop time: ____________ Movements: ____________ Date: ____________ Start time: ____________ Stop time: ____________ Movements: ____________ Date: ____________  Start time: ____________ Stop time: ____________ Movements: ____________ Date: ____________ Start time: ____________ Stop time: ____________ Movements: ____________ Date: ____________ Start time: ____________ Stop time: ____________ Movements: ____________ Date: ____________ Start time: ____________ Stop time: ____________ Movements: ____________ Date: ____________ Start time: ____________ Stop time: ____________ Movements: ____________ Date: ____________ Start time: ____________ Stop time: ____________ Movements: ____________ Date: ____________ Start time: ____________ Stop time: ____________ Movements: ____________ This information is not intended to replace advice given to you by your health care provider. Make sure you discuss any questions you have with your health care provider. Document Revised: 05/30/2019 Document Reviewed: 05/30/2019 Elsevier Patient Education  2020 Elsevier Inc.  Pregnancy and Anemia  Anemia is a condition in which the concentration of red blood cells, or hemoglobin, in the blood is below normal. Hemoglobin is a substance in red blood cells that carries oxygen to the tissues of the body. Anemia results when enough oxygen does not reach these tissues. Anemia is common during pregnancy because the woman's body needs more blood volume and blood cells to provide nutrition to the fetus. The fetus needs iron and folic acid as it is developing. Your body may not produce enough red blood cells because of this. Also, during pregnancy, the liquid part of the blood (plasma) increases by about 30-50%, and the red blood cells increase by only 20%. This lowers the concentration of the red blood cells and creates a natural anemia-like situation. What are the causes? The most common cause of anemia during pregnancy is not having enough iron in the body to make red  blood cells (iron deficiency anemia). Other causes may include:  Folic acid deficiency.  Vitamin B12  deficiency.  Certain prescription or over-the-counter medicines.  Certain medical conditions or infections that destroy red blood cells.  A low platelet count and bleeding caused by antibodies that go through the placenta to the fetus from the mother's blood. What are the signs or symptoms? Mild anemia may not be noticeable. If it becomes severe, symptoms may include:  Feeling tired (fatigue).  Shortness of breath, especially during activity.  Weakness.  Fainting.  Pale looking skin.  Headaches.  A fast or irregular heartbeat (palpitations).  Dizziness. How is this diagnosed? This condition may be diagnosed based on:  Your medical history and a physical exam.  Blood tests. How is this treated? Treatment for anemia during pregnancy depends on the cause of the anemia. Treatment can include:  Dietary changes.  Supplements of iron, vitamin B12, or folic acid.  A blood transfusion. This may be needed if anemia is severe.  Hospitalization. This may be needed if there is a lot of blood loss or severe anemia. Follow these instructions at home:  Follow recommendations from your dietitian or health care provider about changing your diet.  Increase your vitamin C intake. This will help the stomach absorb more iron. Some foods that are high in vitamin C include: ? Oranges. ? Peppers. ? Tomatoes. ? Mangoes.  Eat a diet rich in iron. This would include foods such as: ? Liver. ? Beef. ? Eggs. ? Whole grains. ? Spinach. ? Dried fruit.  Take iron and vitamins as told by your health care provider.  Eat green leafy vegetables. These are a good source of folic acid.  Keep all follow-up visits as told by your health care provider. This is important. Contact a health care provider if:  You have frequent or lasting headaches.  You look pale.  You bruise easily. Get help right away if:  You have extreme weakness, shortness of breath, or chest pain.  You become dizzy  or have trouble concentrating.  You have heavy vaginal bleeding.  You develop a rash.  You have bloody or black, tarry stools.  You faint.  You vomit up blood.  You vomit repeatedly.  You have abdominal pain.  You have a fever.  You are dehydrated. Summary  Anemia is a condition in which the concentration of red blood cells or hemoglobin in the blood is below normal.  Anemia is common during pregnancy because the woman's body needs more blood volume and blood cells to provide nutrition to the fetus.  The most common cause of anemia during pregnancy is not having enough iron in the body to make red blood cells (iron deficiency anemia).  Mild anemia may not be noticeable. If it becomes severe, symptoms may include feeling tired and weak. This information is not intended to replace advice given to you by your health care provider. Make sure you discuss any questions you have with your health care provider. Document Revised: 03/26/2019 Document Reviewed: 11/15/2016 Elsevier Patient Education  2020 Elsevier Inc.   Preventing Injuries During Pregnancy  Injuries can happen during pregnancy. Minor falls and accidents usually do not harm you or your baby. But some injuries can harm you and your baby. Tell your doctor about any injury you suffer. What can I do to avoid injuries? Safety  Remove rugs and loose objects on the floor.  Wear comfortable shoes that have a good grip. Do not wear shoes that have high  heels.  Always wear your seat belt in the car. The lap belt should be below your belly. Always drive safely.  Do not ride on a motorcycle. Activity  Do not take part in rough and violent activities or sports.  Avoid: ? Walking on wet or slippery floors. ? Lifting heavy pots of boiling or hot liquids. ? Fixing electrical problems. ? Being near fires. General instructions  Take over-the-counter and prescription medicines only as told by your doctor.  Know your  blood type and the blood type of the baby's father.  If you are a victim of domestic violence: ? Call your local emergency services (911 in the [place] ? Contact the [employer] Violence Hotline for help and support. Get help right away if:  You fall on your belly or receive any serious blow to your belly.  You have a stiff neck or neck pain after a fall or an injury.  You get a headache or have problems with vision after an injury.  You do not feel the baby move or the baby is not moving as much as normal.  You have been a victim of domestic violence or any other kind of attack.  You have been in a car accident.  You have bleeding from your vagina.  Fluid is leaking from your vagina.  You start to have cramping or pain in your belly (contractions).  You have very bad pain in your lower back.  You feel weak or pass out (faint).  You start to throw up (vomit) after an injury.  You have been burned. Summary  Some injuries that happen during pregnancy can do harm to the baby.  Tell your doctor about any injury.  Take steps to avoid injury. This includes removing rugs and loose objects on the floor. Always wear your seat belt in the car.  Do not take part in rough and violent activities or sports.  Get help right away if you have any serious accident or injury. This information is not intended to replace advice given to you by your health care provider. Make sure you discuss any questions you have with your health care provider. Document Revised: 07/05/2019 Document Reviewed: 10/19/2016 Elsevier Patient Education  2020 [employer].

## 2020-07-30 NOTE — MAU Note (Addendum)
Decreased FM for 2 days. Headache and dizziness for 3 days. Moved once today. Almost fell today but caught self and L wrist is sore. Recently dx with GDM. Took XST tylenol 1 this am and helped some but h/a came back. Denies LOF or VB

## 2020-07-30 NOTE — MAU Provider Note (Signed)
History     CSN: [id number]  Arrival date and time: 07/30/20 0254   First Provider Initiated Contact with Patient 07/30/20 1955      Chief Complaint  Patient presents with  . Decreased Fetal Movement  . Headache  . Dizziness   29 y.o. G2P0010 @30 .2 wks presenting with HA, feeling faint, and decreased FM. Reports temporal HA x3 days. Associated sx are dizziness with HA. She took Tylenol which helped the HA temporarily. Reports episode of feeling faint around 1pm today that caused her to fall to her knees. No syncope. She is eating and drinking but only had 3 hot dogs, fruit, OJ and water today. Reports decreased FM over last 2 days. Denies ctx, LOF, or VB.   OB History    Gravida  2   Para      Term      Preterm      AB  1   Living        SAB  1   TAB      Ectopic      Multiple      Live Births              Past Medical History:  Diagnosis Date  . Bronchitis   . Chlamydia   . Gestational diabetes   . Migraines 2012  . Trichomonas     Past Surgical History:  Procedure Laterality Date  . NO PAST SURGERIES      Family History  Problem Relation Age of Onset  . Asthma Mother   . Arthritis Mother   . Diabetes Mother   . Heart disease Mother   . Hypertension Mother   . Stroke Mother   . Kidney disease Mother   . Vision loss Mother   . Diabetes Maternal Grandmother   . Hypertension Maternal Grandmother     Social History   Tobacco Use  . Smoking status: Never Smoker  . Smokeless tobacco: Never Used  Vaping Use  . Vaping Use: Never used  Substance Use Topics  . Alcohol use: No    Comment: socially  . Drug use: No    Allergies:  Allergies  Allergen Reactions  . Latex Itching and Rash    Medications Prior to Admission  Medication Sig Dispense Refill Last Dose  . acetaminophen (TYLENOL) 500 MG tablet Take 1,000 mg by mouth 2 (two) times daily as needed for pain.    07/30/2020 at Unknown time  . albuterol (PROVENTIL HFA;VENTOLIN HFA)  108 (90 Base) MCG/ACT inhaler Inhale 1-2 puffs into the lungs every 6 (six) hours as needed for wheezing or shortness of breath. 1 Inhaler 0 Past Week at Unknown time  . cyclobenzaprine (FLEXERIL) 10 MG tablet Take 1 tablet (10 mg total) by mouth 3 (three) times daily as needed for muscle spasms. 30 tablet 1 Past Month at Unknown time  . ondansetron (ZOFRAN ODT) 8 MG disintegrating tablet Take 1 tablet (8 mg total) by mouth every 8 (eight) hours as needed for nausea or vomiting. Place under tongue to dissolve. 30 tablet 0 07/29/2020 at Unknown time  . Prenatal Vit-Fe Fumarate-FA (PRENATAL VITAMIN PO) Take by mouth.       Review of Systems  Constitutional: Negative for chills and fever.  Eyes: Negative for visual disturbance.  Gastrointestinal: Negative for abdominal pain, nausea and vomiting.  Genitourinary: Negative for vaginal bleeding.  Neurological: Positive for dizziness and light-headedness. Negative for syncope.   Physical Exam   Blood pressure (!) (P) 115/58,  pulse (P) 84, temperature 98.4 F (36.9 C), resp. rate (P) 16, height 5' (1.524 m), weight 91.6 kg, last menstrual period 12/31/2019, unknown if currently breastfeeding. Orthostatic VS for the past 24 hrs:  BP- Lying Pulse- Lying BP- Sitting Pulse- Sitting BP- Standing at 0 minutes Pulse- Standing at 0 minutes  07/30/20 2033 111/65 91 101/51 98 111/72 106     Physical Exam Vitals and nursing note reviewed.  Constitutional:      General: She is not in acute distress.    Appearance: Normal appearance.  HENT:     Head: Normocephalic and atraumatic.  Cardiovascular:     Rate and Rhythm: Normal rate and regular rhythm.  Pulmonary:     Effort: Pulmonary effort is normal. No respiratory distress.     Breath sounds: Normal breath sounds. No stridor. No wheezing, rhonchi or rales.  Musculoskeletal:        General: Normal range of motion.     Cervical back: Normal range of motion.  Skin:    General: Skin is warm and dry.   Neurological:     General: No focal deficit present.     Mental Status: She is alert and oriented to person, place, and time.  Psychiatric:        Mood and Affect: Mood normal.        Behavior: Behavior normal.   EFM: 155 bpm, mod variability, + accels, no decels Toco: none  Results for orders placed or performed during the hospital encounter of 07/30/20 (from the past 24 hour(s))  Protein / creatinine ratio, urine     Status: None   Collection Time: 07/30/20  7:30 PM  Result Value Ref Range   Creatinine, Urine 103.15 mg/dL   Total Protein, Urine 11 mg/dL   Protein Creatinine Ratio 0.11 0.00 - 0.15 mg/mg[Cre]  Urinalysis, Routine w reflex microscopic Urine, Clean Catch     Status: Abnormal   Collection Time: 07/30/20  7:44 PM  Result Value Ref Range   Color, Urine YELLOW YELLOW   APPearance HAZY (A) CLEAR   Specific Gravity, Urine 1.013 1.005 - 1.030   pH 7.0 5.0 - 8.0   Glucose, UA NEGATIVE NEGATIVE mg/dL   Hgb urine dipstick NEGATIVE NEGATIVE   Bilirubin Urine NEGATIVE NEGATIVE   Ketones, ur NEGATIVE NEGATIVE mg/dL   Protein, ur NEGATIVE NEGATIVE mg/dL   Nitrite NEGATIVE NEGATIVE   Leukocytes,Ua LARGE (A) NEGATIVE   RBC / HPF 0-5 0 - 5 RBC/hpf   WBC, UA 11-20 0 - 5 WBC/hpf   Bacteria, UA RARE (A) NONE SEEN   Squamous Epithelial / LPF 11-20 0 - 5   Mucus PRESENT   CBC     Status: Abnormal   Collection Time: 07/30/20  7:47 PM  Result Value Ref Range   WBC 14.1 (H) 4.0 - 10.5 K/uL   RBC 4.54 3.87 - 5.11 MIL/uL   Hemoglobin 9.9 (L) 12.0 - 15.0 g/dL   HCT 19.6 (L) 36 - 46 %   MCV 70.9 (L) 80.0 - 100.0 fL   MCH 21.8 (L) 26.0 - 34.0 pg   MCHC 30.7 30.0 - 36.0 g/dL   RDW 22.2 (H) 97.9 - 89.2 %   Platelets 304 150 - 400 K/uL   nRBC 0.0 0.0 - 0.2 %  Comprehensive metabolic panel     Status: Abnormal   Collection Time: 07/30/20  7:47 PM  Result Value Ref Range   Sodium 136 135 - 145 mmol/L   Potassium 3.8 3.5 -  5.1 mmol/L   Chloride 107 98 - 111 mmol/L   CO2 19 (L) 22  - 32 mmol/L   Glucose, Bld 102 (H) 70 - 99 mg/dL   BUN 5 (L) 6 - 20 mg/dL   Creatinine, Ser 6.71 0.44 - 1.00 mg/dL   Calcium 9.0 8.9 - 24.5 mg/dL   Total Protein 6.3 (L) 6.5 - 8.1 g/dL   Albumin 2.4 (L) 3.5 - 5.0 g/dL   AST 11 (L) 15 - 41 U/L   ALT 12 0 - 44 U/L   Alkaline Phosphatase 102 38 - 126 U/L   Total Bilirubin 0.4 0.3 - 1.2 mg/dL   GFR calc non Af Amer >60 >60 mL/min   Anion gap 10 5 - 15   MAU Course  Procedures Fioricet  MDM Labs ordered and reviewed. NST reactive but only feeling a few movements, BPP ordered. BPP 8/8>10/10, nml AFI. Pt feeling some FMs, marked on NST. Other sx likely d/t anemia, will supplement and encouraged Fe rich foods. Needs to eat often and increase water intake. Stable for discharge home.   Assessment and Plan   1. [redacted] weeks gestation of pregnancy   2. Decreased fetal movement   3. NST (non-stress test) reactive   4. Anemia during pregnancy in third trimester   5. Fall, initial encounter    Discharge home Follow up at [facility] as scheduled [facility] daily Rx Ferrex  Allergies as of 07/30/2020      Reactions   Latex Itching, Rash      Medication List    TAKE these medications   acetaminophen 500 MG tablet Commonly known as: TYLENOL Take 1,000 mg by mouth 2 (two) times daily as needed for pain.   albuterol 108 (90 Base) MCG/ACT inhaler Commonly known as: VENTOLIN HFA Inhale 1-2 puffs into the lungs every 6 (six) hours as needed for wheezing or shortness of breath.   cyclobenzaprine 10 MG tablet Commonly known as: FLEXERIL Take 1 tablet (10 mg total) by mouth 3 (three) times daily as needed for muscle spasms.   iron polysaccharides 150 MG capsule Commonly known as: NIFEREX Take 1 capsule (150 mg total) by mouth daily.   ondansetron 8 MG disintegrating tablet Commonly known as: Zofran ODT Take 1 tablet (8 mg total) by mouth every 8 (eight) hours as needed for nausea or vomiting. Place under tongue to dissolve.   PRENATAL VITAMIN  PO Take by mouth.       Donette Larry, CNM 07/30/2020, 10:04 PM

## 2020-07-31 NOTE — Telephone Encounter (Signed)
Pt was advised yesterday to be treated at the ED. Pt went to the ED yesterday.

## 2020-08-01 LAB — CULTURE, OB URINE

## 2020-08-04 ENCOUNTER — Encounter: Payer: Self-pay | Admitting: Obstetrics and Gynecology

## 2020-08-04 ENCOUNTER — Encounter: Payer: BC Managed Care – PPO | Admitting: Obstetrics and Gynecology

## 2020-08-04 ENCOUNTER — Ambulatory Visit (INDEPENDENT_AMBULATORY_CARE_PROVIDER_SITE_OTHER): Payer: BC Managed Care – PPO | Admitting: Obstetrics and Gynecology

## 2020-08-04 ENCOUNTER — Other Ambulatory Visit: Payer: Self-pay

## 2020-08-04 ENCOUNTER — Ambulatory Visit: Payer: BC Managed Care – PPO | Admitting: Physical Therapy

## 2020-08-04 VITALS — BP 109/73 | HR 102 | Wt 200.0 lb

## 2020-08-04 DIAGNOSIS — B009 Herpesviral infection, unspecified: Secondary | ICD-10-CM

## 2020-08-04 DIAGNOSIS — O0990 Supervision of high risk pregnancy, unspecified, unspecified trimester: Secondary | ICD-10-CM

## 2020-08-04 DIAGNOSIS — D509 Iron deficiency anemia, unspecified: Secondary | ICD-10-CM | POA: Insufficient documentation

## 2020-08-04 DIAGNOSIS — O99820 Streptococcus B carrier state complicating pregnancy: Secondary | ICD-10-CM

## 2020-08-04 DIAGNOSIS — M549 Dorsalgia, unspecified: Secondary | ICD-10-CM

## 2020-08-04 DIAGNOSIS — O99891 Other specified diseases and conditions complicating pregnancy: Secondary | ICD-10-CM

## 2020-08-04 DIAGNOSIS — O24419 Gestational diabetes mellitus in pregnancy, unspecified control: Secondary | ICD-10-CM | POA: Insufficient documentation

## 2020-08-04 DIAGNOSIS — A599 Trichomoniasis, unspecified: Secondary | ICD-10-CM

## 2020-08-04 DIAGNOSIS — Z3A31 31 weeks gestation of pregnancy: Secondary | ICD-10-CM

## 2020-08-04 DIAGNOSIS — O361930 Maternal care for other isoimmunization, third trimester, not applicable or unspecified: Secondary | ICD-10-CM | POA: Insufficient documentation

## 2020-08-04 MED ORDER — ACCU-CHEK SOFTCLIX LANCETS MISC: 1.0000 | Freq: Four times a day (QID) | 12 refills | Status: DC

## 2020-08-04 MED ORDER — ACCU-CHEK GUIDE W/DEVICE KIT: 1.0000 | PACK | Freq: Four times a day (QID) | 0 refills | Status: DC

## 2020-08-04 MED ORDER — ACCU-CHEK GUIDE VI STRP: ORAL_STRIP | 12 refills | Status: DC

## 2020-08-04 NOTE — Progress Notes (Signed)
Has nutrition class tomorrow, does not [employer] supplies.   Desires a Systems developer

## 2020-08-04 NOTE — Progress Notes (Signed)
Prenatal Visit Note Date: 08/04/2020 Clinic: [facility]  Transfer of care visit from [facility]  Subjective:  Laura Hernandez is a 29 y.o. G2P0010 at [redacted]w[redacted]d being seen today for ongoing prenatal care.  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  Patient reports no complaints.   Contractions: Not present. Vag. Bleeding: None.  Movement: Present. Denies leaking of fluid.   The following portions of the patient's history were reviewed and updated as appropriate: allergies, current medications, past family history, past medical history, past social history, past surgical history and problem list. Problem list updated.  Objective:   Vitals:   08/04/20 1451  BP: 109/73  Pulse: (!) 102  Weight: 200 lb (90.7 kg)    Fetal Status: Fetal Heart Rate (bpm): 152 Fundal Height: 32 cm Movement: Present     General:  Alert, oriented and cooperative. Patient is in no acute distress.  Skin: Skin is warm and dry. No rash noted.   Cardiovascular: Normal heart rate noted  Respiratory: Normal respiratory effort, no problems with respiration noted  Abdomen: Soft, gravid, appropriate for gestational age. Pain/Pressure: Present     Pelvic:  Cervical exam deferred        Extremities: Normal range of motion.  Edema: Trace  Mental Status: Normal mood and affect. Normal behavior. Normal judgment and thought content.   Urinalysis:       Assessment and Plan:  Pregnancy: G2P0010 at [redacted]w[redacted]d  1. HSV-2 (herpes simplex virus 2) infection Start ppx at 34-36wks  2. Supervision of high risk pregnancy, antepartum Routine care.   3. Urinary tract colonization by group B Streptococcus affecting pregnancy tx in labor. toc negative  4. Gestational diabetes mellitus (GDM) in third trimester, gestational diabetes method of control unspecified Has first DM education visit tomorrow. D/w her re: plan of care with diabetes  5. Iron deficiency anemia, unspecified iron deficiency anemia type Confirms on qday iron  6. Trichomonas infection toc nv  7. 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 Please refer to After Visit Summary for other counseling recommendations.  Return in about 1 week (around 08/11/2020) for 7-10d in person, hrob.   Steele Bing, MD

## 2020-08-05 ENCOUNTER — Encounter: Payer: Self-pay | Admitting: *Deleted

## 2020-08-05 ENCOUNTER — Ambulatory Visit: Payer: BC Managed Care – PPO | Admitting: Dietician

## 2020-08-06 ENCOUNTER — Other Ambulatory Visit: Payer: Self-pay

## 2020-08-06 ENCOUNTER — Encounter: Payer: Self-pay | Admitting: Dietician

## 2020-08-06 ENCOUNTER — Encounter: Payer: BC Managed Care – PPO | Attending: Certified Nurse Midwife | Admitting: Dietician

## 2020-08-06 VITALS — Ht 60.0 in | Wt 201.5 lb

## 2020-08-06 DIAGNOSIS — O24419 Gestational diabetes mellitus in pregnancy, unspecified control: Secondary | ICD-10-CM | POA: Insufficient documentation

## 2020-08-06 DIAGNOSIS — O24410 Gestational diabetes mellitus in pregnancy, diet controlled: Secondary | ICD-10-CM

## 2020-08-06 NOTE — Patient Instructions (Signed)
   Read booklet on Gestational Diabetes   Follow Gestational Meal Planning Guidelines   Complete a 3 day food record and bring to next appointment  Check blood sugars 4x/day-before breakfast and 2 hrs. after every meal and record           Call MD for prescription for meter strips and lancets  Strips __Contour Next_________  Lancets_____________________  Bring blood sugar log to next appointment  Walk 20-30 min. at least 5 times a week if permitted by MD -- gradually increase from current activity as tolerated

## 2020-08-06 NOTE — Progress Notes (Signed)
Diabetes Self-Management Education  Visit Type: First/Initial  Appt. Start Time: 1515 Appt. End Time: 1645  08/06/2020  [name], identified by name and date of birth, is a 29 y.o. female with a diagnosis of Diabetes: Gestational Diabetes.   ASSESSMENT  Height 5' (1.524 m), weight 201 lb 8 oz (91.4 kg), last menstrual period 12/31/2019, unknown if currently breastfeeding. Body mass index is 39.35 kg/m.   Diabetes Self-Management Education - 08/06/20 1605      Visit Information   Visit Type First/Initial      Initial Visit   Diabetes Type Gestational Diabetes    Are you taking your medications as prescribed? Yes      Pre-Education Assessment   Patient understands the diabetes disease and treatment process. Needs Instruction    Patient understands incorporating nutritional management into lifestyle. Needs Instruction    Patient undertands incorporating physical activity into lifestyle. Needs Review    Patient understands using medications safely. Needs Instruction    Patient understands monitoring blood glucose, interpreting and using results Needs Review    Patient understands prevention, detection, and treatment of acute complications. Needs Review    Patient understands prevention, detection, and treatment of chronic complications. --   not applicable at this time   Patient understands how to develop strategies to address psychosocial issues. Needs Review    Patient understands how to develop strategies to promote health/change behavior. Needs Review      Complications   How often do you check your blood sugar? 0 times/day (not testing)    Have you had a dilated eye exam in the past 12 months? No    Have you had a dental exam in the past 12 months? Yes   05/2020   Are you checking your feet? Yes    How many days per week are you checking your feet? 7      Dietary Intake   Breakfast Belvita biscuit with fruit and juice; oatmeal    Snack (morning) none    Lunch  chick fila meal about once a week; usually snacks on chips and/or fruit and soda; occasional frozen meal    Snack (afternoon) none    Dinner fish or chicken, cornbread or other bread, cabbage or broccoli or other veg    Snack (evening) none    Beverage(s) soda, juice water      Exercise   Exercise Type Light (walking / raking leaves)    How many days per week to you exercise? 3    How many minutes per day do you exercise? 10    Total minutes per week of exercise 30      Patient Education   Previous Diabetes Education No    Disease state  Other (comment)   gestational diabetes   Nutrition management  Role of diet in the treatment of diabetes and the relationship between the three main macronutrients and blood glucose level;Meal timing in regards to the patients' current diabetes medication.;Meal options for control of blood glucose level and chronic complications.    Physical activity and exercise  Role of exercise on diabetes management, blood pressure control and cardiac health.;Helped patient identify appropriate exercises in relation to his/her diabetes, diabetes complications and other health issue.    Medications Other (comment)   possible medications used to treat gestational diabetes   Monitoring Taught/evaluated SMBG meter.;Purpose and frequency of SMBG.;Taught/discussed recording of test results and interpretation of SMBG.;Identified appropriate SMBG and/or A1C goals.    Acute complications Taught treatment  of hypoglycemia - the 15 rule.    Psychosocial adjustment Role of stress on diabetes      Outcomes   Expected Outcomes Demonstrated interest in learning. Expect positive outcomes    Future DMSE Other (comment)   1 week for RD visit          Individualized Plan for Diabetes Self-Management Training:   Learning Objective:  Patient will have a greater understanding of diabetes self-management. Patient education plan is to attend individual and/or group sessions per assessed  needs and concerns.   Plan:   Patient Instructions   Read booklet on Gestational Diabetes   Follow Gestational Meal Planning Guidelines   Complete a 3 day food record and bring to next appointment  Check blood sugars 4x/day-before breakfast and 2 hrs. after every meal and record           Call MD for prescription for meter strips and lancets  Strips __Contour Next_________  Lancets_____________________  Bring blood sugar log to next appointment  Walk 20-30 min. at least 5 times a week if permitted by MD -- gradually increase from current activity as tolerated   Expected Outcomes:  Demonstrated interest in learning. Expect positive outcomes  Education material provided: General Meal Planning Guidelines (for GDM); food diary sheet; blood glucose record; Gestational Diabetes booklet (BD); Contour Next meter kit  If problems or questions, patient to contact team via:  Phone  Future DSME appointment: Other (comment) (1 week for RD visit)

## 2020-08-10 ENCOUNTER — Encounter: Payer: BC Managed Care – PPO | Admitting: Obstetrics & Gynecology

## 2020-08-11 ENCOUNTER — Ambulatory Visit: Payer: BC Managed Care – PPO | Admitting: Physical Therapy

## 2020-08-12 ENCOUNTER — Ambulatory Visit: Payer: BC Managed Care – PPO | Admitting: Dietician

## 2020-08-12 ENCOUNTER — Telehealth (INDEPENDENT_AMBULATORY_CARE_PROVIDER_SITE_OTHER): Payer: BC Managed Care – PPO | Admitting: Student

## 2020-08-12 ENCOUNTER — Telehealth: Payer: Self-pay | Admitting: Radiology

## 2020-08-12 ENCOUNTER — Other Ambulatory Visit: Payer: Self-pay

## 2020-08-12 DIAGNOSIS — O0990 Supervision of high risk pregnancy, unspecified, unspecified trimester: Secondary | ICD-10-CM

## 2020-08-12 DIAGNOSIS — Z3A32 32 weeks gestation of pregnancy: Secondary | ICD-10-CM

## 2020-08-12 NOTE — Progress Notes (Addendum)
Patient ID: Laura Hernandez, female   DOB: 1991-01-06, 29 y.o.   MRN: [id number] I connected with Laura Hernandez 08/12/20 at 11:10 AM EDT by: MyChart video and verified that I am speaking with the correct person using two identifiers.  Patient is located at home and provider is located at [facility].     The purpose of this virtual visit is to provide medical care while limiting exposure to the novel coronavirus. I discussed the limitations, risks, security and privacy concerns of performing an evaluation and management service by MyChart video and the availability of in person appointments. I also discussed with the patient that there may be a patient responsible charge related to this service. By engaging in this virtual visit, you consent to the provision of healthcare.  Additionally, you authorize for your insurance to be billed for the services provided during this visit.  The patient expressed understanding and agreed to proceed.  The following staff members participated in the virtual visit:  Christine    PRENATAL VISIT NOTE  Subjective:  Laura Hernandez is a 29 y.o. G2P0010 at [redacted]w[redacted]d  for phone visit for ongoing prenatal care.  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  Patient reports headache and sniffles. She started feeling sick yesterday. She denies any close contacts. . She has not been vaccinated. She has not had a TOC for her trich.   Contractions: Not  present. Vag. Bleeding: None.  Movement: Present. Denies leaking of fluid.   The following portions of the patient's history were reviewed and updated as appropriate: allergies, current medications, past family history, past medical history, past social history, past surgical history and problem list.   Objective:  There were no vitals filed for this visit. Self-Obtained  Fetal Status:     Movement: Present     Assessment and Plan:  Pregnancy: G2P0010 at [redacted]w[redacted]d  1. [redacted] weeks gestation of pregnancy   2. Supervision of high risk pregnancy, antepartum    -Recommended that patient get COVID test this afternoon, discussed what patient should do if positive (quarantine, notify close contacts). Patient will get COVID vaccine today if not positive today.  -Patient reports that her blood sugars have been normal however, she was not feeling well and BS was not reviewed today.   COVID-19 Vaccine Counseling: The patient was counseled on the potential benefits and lack of known risks of COVID vaccination, during pregnancy and breastfeeding, during today's visit. The patient's questions and concerns were addressed today, including safety of the vaccination and potential side effects as they have been published by ACOG and SMFM. The patient has been informed that there have not been any documented vaccine related injuries, deaths or birth defects to infant or mom after receiving the COVID-19 vaccine to date. The patient has been made aware that although she is not at increased risk of contracting COVID-19 during pregnancy, she is at increased risk of developing severe disease and complications if she contracts COVID-19 while pregnant. All patient questions were addressed during our visit today. The patient is planning to get vaccinated.   -  After patient's visit was finished, RN notified CNM that patient had expressed interest in [place].  -Will schedule patient for CNM visit in one week, however, patient is  GDMA1 and must be well controlled in order to have WB -Attempted to reach patient to discuss waterbirth and GDMA1 but no answer at either telephone and no way to leave Voice Mail. Information posted in AVS about waterbirth, as well as instructions on how to sign up for classes.  -Needs TOC for trich in one week.    Preterm labor symptoms and general obstetric precautions including but not limited to vaginal bleeding, contractions, leaking of fluid and fetal movement were reviewed in detail with the patient.  Return in about 1 week (around 08/19/2020), or needs visit with CNM ASAP.  Future Appointments  Date Time Provider Department Center  08/18/2020 11:00 AM Mariane Masters, PT [facility] None  08/25/2020 10:00 AM Mariane Masters, PT [facility] None  08/25/2020  2:00 PM Logan Bing, MD [facility]  09/01/2020 10:00 AM Mariane Masters, PT [facility] None  09/08/2020 10:00 AM Mariane Masters, PT [facility] None  09/15/2020 10:00 AM Mariane Masters, PT [facility] None     Time spent on virtual visit: 30 minutes  Marylene Land, CNM

## 2020-08-12 NOTE — Patient Instructions (Addendum)
COVID-19: How to Protect Yourself and Others Know how it spreads  There is currently no vaccine to prevent coronavirus disease 2019 (COVID-19).  The best way to prevent illness is to avoid being exposed to this virus.  The virus is thought to spread mainly from person-to-person. ? Between people who are in close contact with one another (within about 6 feet). ? Through respiratory droplets produced when an infected person coughs, sneezes or talks. ? These droplets can land in the mouths or noses of people who are nearby or possibly be inhaled into the lungs. ? COVID-19 may be spread by people who are not showing symptoms. Everyone should Clean your hands often  Wash your hands often with soap and water for at least 20 seconds especially after you have been in a public place, or after blowing your nose, coughing, or sneezing.  If soap and water are not readily available, use a hand sanitizer that contains at least 60% alcohol. Cover all surfaces of your hands and rub them together until they feel dry.  Avoid touching your eyes, nose, and mouth with unwashed hands. Avoid close contact  Limit contact with others as much as possible.  Avoid close contact with people who are sick.  Put distance between yourself and other people. ? Remember that some people without symptoms may be able to spread virus. ? This is especially important for people who are at higher risk of getting very RetroStamps.it Cover your mouth and nose with a mask when around others  You could spread COVID-19 to others even if you do not feel sick.  Everyone should wear a mask in public settings and when around people not living in their household, especially when social distancing is difficult to maintain. ? Masks should not be placed on young children under age 57, anyone who has trouble breathing, or is unconscious, incapacitated or otherwise  unable to remove the mask without assistance.  The mask is meant to protect other people in case you are infected.  Do NOT use a facemask meant for a Research scientist (physical sciences).  Continue to keep about 6 feet between yourself and others. The mask is not a substitute for social distancing. Cover coughs and sneezes  Always cover your mouth and nose with a tissue when you cough or sneeze or use the inside of your elbow.  Throw used tissues in the trash.  Immediately wash your hands with soap and water for at least 20 seconds. If soap and water are not readily available, clean your hands with a hand sanitizer that contains at least 60% alcohol. Clean and disinfect  Clean AND disinfect frequently touched surfaces daily. This includes tables, doorknobs, light switches, countertops, handles, desks, phones, keyboards, toilets, faucets, and sinks. ktimeonline.com  If surfaces are dirty, clean them: Use detergent or soap and water prior to disinfection.  Then, use a household disinfectant. You can see a list of EPA-registered household disinfectants here. [place].co.uk 06/26/2019 This information is not intended to replace advice given to you by your health care provider. Make sure you discuss any questions you have with your health care provider. Document Revised: 07/04/2019 Document Reviewed: 05/02/2019 Elsevier Patient Education  2020 [employer].   Considering [place]? Guide for patients at [facility] Why consider waterbirth? . Gentle birth for babies  . Less pain medicine used in labor  . May allow for passive descent/less pushing  . May reduce perineal tears  . More mobility and instinctive maternal position changes  .  Increased maternal relaxation  . Reduced blood pressure in labor   Is waterbirth safe? What are the risks of infection, drowning or other complications? . Infection:  Marland Kitchen Very low  risk (3.7 % for tub vs 4.8% for bed)  . 7 in 8000 waterbirths with documented infection  . Poorly cleaned equipment most common cause  . Slightly lower group B strep transmission rate  . Drowning  . Maternal:  . Very low risk  . Related to seizures or fainting  . Newborn:  Marland Kitchen Very low risk. No evidence of increased risk of respiratory problems in multiple large studies  . Physiological protection from breathing under water  . Avoid underwater birth if there are any fetal complications  . Once baby's head is out of the water, keep it out.  . Birth complication  . Some reports of cord trauma, but risk decreased by bringing baby to surface gradually  . No evidence of increased risk of shoulder dystocia. Mothers can usually change positions faster in water than in a bed, possibly aiding the maneuvers to free the shoulder.  ? You must attend a Waterbirth class at [employer] [facility] . 3rd Wednesday of every month from 7-9pm  . Free  . Register by calling [phone] or online at [email or web address]  . Bring [place] the certificate from the class to your prenatal appointment  Meet with a midwife at 36 weeks to see if you can still plan a waterbirth and to sign the consent.   If you plan a waterbirth at [facility], the following purchases are optional: . Fish Net . Bathing suit top  . Long-handled mirror  .  Things that would prevent you from having a waterbirth: . Unknown or Positive COVID-19 diagnosis upon admission to hospital  . Premature, <37wks  . Previous cesarean birth  . Presence of thick meconium-stained fluid  . Multiple gestation (Twins, triplets, etc.)  . Uncontrolled diabetes or gestational diabetes requiring medication  . Hypertension diagnosed in pregnancy or preexisting hypertension (gestational hypertension, preeclampsia, or chronic hypertension) . Heavy vaginal bleeding  . Non-reassuring fetal heart rate   . Active infection (MRSA, etc.). Group B Strep is NOT a contraindication for waterbirth.  . If your labor has to be induced and induction method requires continuous monitoring of the baby's heart rate  . Other risks/issues identified by your obstetrical provider  .  Please remember that birth is unpredictable. Under certain unforeseeable circumstances your provider may advise against giving birth in the tub. These decisions will be made on a case-by-case basis and with the safety of you and your baby as our highest priority.  **Please remember that in order to have a waterbirth, you must test Negative to COVID-19 upon admission to the hospital.**

## 2020-08-12 NOTE — Telephone Encounter (Signed)
Tried calling patient on home number as discussed this morning patient did not answer. No voicemail to leave message

## 2020-08-12 NOTE — Progress Notes (Signed)
I connected with  [name] on 08/12/20 at 11:10 AM EDT by telephone and verified that I am speaking with the correct person using two identifiers.   I discussed the limitations, risks, security and privacy concerns of performing an evaluation and management service by telephone and the availability of in person appointments. I also discussed with the patient that there may be a patient responsible charge related to this service. The patient expressed understanding and agreed to proceed.  Scheryl Marten, RN 08/12/2020  11:21 AM   Is checking her blood sugars

## 2020-08-14 ENCOUNTER — Inpatient Hospital Stay (HOSPITAL_COMMUNITY)
Admission: AD | Admit: 2020-08-14 | Discharge: 2020-08-15 | Disposition: A | Discharge status: Home / Self Care | Payer: BC Managed Care – PPO | Attending: Family Medicine | Admitting: Family Medicine

## 2020-08-14 ENCOUNTER — Other Ambulatory Visit: Payer: Self-pay

## 2020-08-14 ENCOUNTER — Encounter (HOSPITAL_COMMUNITY): Payer: Self-pay | Admitting: Family Medicine

## 2020-08-14 DIAGNOSIS — Z3A32 32 weeks gestation of pregnancy: Secondary | ICD-10-CM

## 2020-08-14 DIAGNOSIS — O99713 Diseases of the skin and subcutaneous tissue complicating pregnancy, third trimester: Secondary | ICD-10-CM | POA: Diagnosis not present

## 2020-08-14 DIAGNOSIS — L02214 Cutaneous abscess of groin: Secondary | ICD-10-CM | POA: Diagnosis not present

## 2020-08-14 MED ORDER — LIDOCAINE 5 % EX OINT
TOPICAL_OINTMENT | Freq: Once | CUTANEOUS | Status: AC
  Filled 2020-08-14: qty 35.44

## 2020-08-14 MED ORDER — LIDOCAINE HCL (PF) 1 % IJ SOLN
30.0000 mL | Freq: Once | INTRAMUSCULAR | Status: AC
  Administered 2020-08-15: 30 mL via INTRADERMAL
  Filled 2020-08-14: qty 30

## 2020-08-14 MED ORDER — OXYCODONE-ACETAMINOPHEN 5-325 MG PO TABS
2.0000 | ORAL_TABLET | Freq: Once | ORAL | Status: AC
  Administered 2020-08-15: 2 via ORAL
  Filled 2020-08-14: qty 2

## 2020-08-14 NOTE — MAU Provider Note (Signed)
Chief Complaint:  Cyst   First Provider Initiated Contact with Patient 08/14/20 2350      HPI: Laura Hernandez is a 29 y.o. G2P0010 at 11w3dby early ultrasound who presents to maternity admissions reporting a painful bump/boil on her right inner thigh x 5 days with severe pain starting today. She denies any history of boils near the vaginal area but reports she has them in her axilla and under her breasts sometimes.  She has tried warm compresses but it is worsening instead of improving. She reports a fever at home of 101 but took Tylenol and it resolved.  There are no other symptoms. She has not tried any other treatments.  She reports good fetal movement, denies LOF, abdominal pain, or vaginal bleeding.    Location: right inner thigh/right inguinal area Quality: sharp/pressure/burning Severity: 10/10 on pain scale Duration: 5 days Timing: constant Modifying factors: Tylenol and warm compresses have not helped Associated signs and symptoms: fever at home  HPI  Past Medical History: Past Medical History:  Diagnosis Date  . Bronchitis   . Chlamydia   . Gestational diabetes   . History of gonorrhea 09/18/2013   2012    . History of trichomonal vaginitis 09/18/2013   2012    . Migraines 2012  . Trichomonas     Past obstetric history: OB History  Gravida Para Term Preterm AB Living  2       1    SAB TAB Ectopic Multiple Live Births  1            # Outcome Date GA Lbr Len/2nd Weight Sex Delivery Anes PTL Lv  2 Current           1 SAB 07/2013            Past Surgical History: Past Surgical History:  Procedure Laterality Date  . NO PAST SURGERIES      Family History: Family History  Problem Relation Age of Onset  . Asthma Mother   . Arthritis Mother   . Diabetes Mother   . Heart disease Mother   . Hypertension Mother   . Stroke Mother   . Kidney disease Mother   . Vision loss Mother   . Diabetes Maternal Grandmother   . Hypertension Maternal Grandmother      Social History: Social History   Tobacco Use  . Smoking status: Never Smoker  . Smokeless tobacco: Never Used  Vaping Use  . Vaping Use: Never used  Substance Use Topics  . Alcohol use: No  . Drug use: No    Allergies:  Allergies  Allergen Reactions  . Latex Itching and Rash    Meds:  Medications Prior to Admission  Medication Sig Dispense Refill Last Dose  . Accu-Chek Softclix Lancets lancets 1 each by Other route 4 (four) times daily. 100 each 12   . acetaminophen (TYLENOL) 500 MG tablet Take 1,000 mg by mouth 2 (two) times daily as needed for pain.      .Marland Kitchenalbuterol (PROVENTIL HFA;VENTOLIN HFA) 108 (90 Base) MCG/ACT inhaler Inhale 1-2 puffs into the lungs every 6 (six) hours as needed for wheezing or shortness of breath. 1 Inhaler 0   . Blood Glucose Monitoring Suppl (ACCU-CHEK GUIDE) w/Device KIT 1 Device by Does not apply route 4 (four) times daily. 1 kit 0   . cyclobenzaprine (FLEXERIL) 10 MG tablet Take 1 tablet (10 mg total) by mouth 3 (three) times daily as needed for muscle spasms. 30 tablet 1   .  fluconazole (DIFLUCAN) 150 MG tablet Take 150 mg by mouth once. (Patient not taking: Reported on 08/12/2020)     . glucose blood (ACCU-CHEK GUIDE) test strip Use to check blood sugars four times a day was instructed 50 each 12   . iron polysaccharides (NIFEREX) 150 MG capsule Take 1 capsule (150 mg total) by mouth daily. 30 capsule 1   . ondansetron (ZOFRAN ODT) 8 MG disintegrating tablet Take 1 tablet (8 mg total) by mouth every 8 (eight) hours as needed for nausea or vomiting. Place under tongue to dissolve. 30 tablet 0   . Prenatal Vit-Fe Fumarate-FA (PRENATAL VITAMIN PO) Take by mouth.       ROS:  Review of Systems  Constitutional: Positive for fever. Negative for chills and fatigue.  Eyes: Negative for visual disturbance.  Respiratory: Negative for shortness of breath.   Cardiovascular: Negative for chest pain.  Gastrointestinal: Negative for abdominal pain,  nausea and vomiting.  Genitourinary: Negative for difficulty urinating, dysuria, flank pain, pelvic pain, vaginal bleeding, vaginal discharge and vaginal pain.  Skin:       Boil on right inner thigh/groin   Neurological: Negative for dizziness and headaches.  Psychiatric/Behavioral: Negative.      I have reviewed patient's Past Medical Hx, Surgical Hx, Family Hx, Social Hx, medications and allergies.   Physical Exam   Patient Vitals for the past 24 hrs:  BP Temp Pulse Resp  08/14/20 2303 119/63 98.5 F (36.9 C) (!) 102 18   Constitutional: Well-developed, well-nourished female in no acute distress.  Cardiovascular: normal rate Respiratory: normal effort GI: Abd soft, non-tender, gravid appropriate for gestational age.  MS: Extremities nontender, no edema, normal ROM Neurologic: Alert and oriented x 4.  GU: Neg CVAT.  Pelvic: visual inspection reveals smooth raised area ~ 2 cm x 2 cm that is soft, fluctuant, surrounded by larger, 5- 6 cm by 5-6 cm area of edema that is nonfluctuant.  Entire area is painful to palpation.       FHT:  142 by doppler    INCISION AND DRAINAGE Performed by: Fatima Blank Consent: Verbal consent obtained. Risks and benefits: risks, benefits and alternatives were discussed Time out performed prior to procedure Type: abscess Body area: right inguinal area Anesthesia: local infiltration, plus topical lidocaine Incision was made with a scalpel. Local anesthetic: lidocaine1% without epinephrine Anesthetic total: 4 ml Complexity: complex Blunt dissection to break up loculations Drainage: purulent Drainage amount: 50- 75 ml  Packing material: n/a Patient tolerance: Patient tolerated the procedure well with no immediate complications. Labs: No results found for this or any previous visit (from the past 24 hour(s)). B/Positive/-- (06/08 1011)  Imaging:    MAU Course/MDM: Orders Placed This Encounter  Procedures  . Wet prep, genital   . Discharge patient    Meds ordered this encounter  Medications  . oxyCODONE-acetaminophen (PERCOCET/ROXICET) 5-325 MG per tablet 2 tablet  . lidocaine (XYLOCAINE) 5 % ointment  . lidocaine (PF) (XYLOCAINE) 1 % injection 30 mL  . cefadroxil (DURICEF) 500 MG capsule    Sig: Take 1 capsule (500 mg total) by mouth 2 (two) times daily for 7 days.    Dispense:  14 capsule    Refill:  0    Order Specific Question:   Supervising Provider    Answer:   Donnamae Jude [[id number]]  . oxyCODONE-acetaminophen (PERCOCET/ROXICET) 5-325 MG tablet    Sig: Take 1-2 tablets by mouth every 6 (six) hours as needed.    Dispense:  10  tablet    Refill:  0    Order Specific Question:   Supervising Provider    Answer:   Donnamae Jude [[id number]]    Pt without obstetric complaints so FHT obtained and wnl   Right inguinal abscess, fluctuant, with significant pain for patient Premedicated with Percocet x 2 tabs and topical lidocaine ointment prior to I&D See above procedure note Pt did not tolerate procedure well and some breakup of loculations done but pt unable to tolerate deeper procedure or packing Will D/C home with abx and close f/u in the office Message sent for pt to f/u at [facility] this week Return to MAU as needed for emergencies  Pt discharge with strict infection precautions.    Assessment: 1. Soft tissue abscess of inguinal region   2. [redacted] weeks gestation of pregnancy     Plan: Discharge home Labor precautions and fetal kick counts  Follow-up [facility] for [place] at [facility] Follow up.   Specialty: Obstetrics and Gynecology Why: The office will call you to set up appoinment this week. Return to MAU as needed for emergencies. Contact information: [place] [phone]             Allergies as of 08/15/2020      Reactions   Latex Itching, Rash      Medication List    TAKE these medications   Accu-Chek  Guide test strip Generic drug: glucose blood Use to check blood sugars four times a day was instructed   Accu-Chek Guide w/Device Kit 1 Device by Does not apply route 4 (four) times daily.   Accu-Chek Softclix Lancets lancets 1 each by Other route 4 (four) times daily.   acetaminophen 500 MG tablet Commonly known as: TYLENOL Take 1,000 mg by mouth 2 (two) times daily as needed for pain.   albuterol 108 (90 Base) MCG/ACT inhaler Commonly known as: VENTOLIN HFA Inhale 1-2 puffs into the lungs every 6 (six) hours as needed for wheezing or shortness of breath.   cefadroxil 500 MG capsule Commonly known as: DURICEF Take 1 capsule (500 mg total) by mouth 2 (two) times daily for 7 days.   cyclobenzaprine 10 MG tablet Commonly known as: FLEXERIL Take 1 tablet (10 mg total) by mouth 3 (three) times daily as needed for muscle spasms.   fluconazole 150 MG tablet Commonly known as: DIFLUCAN Take 150 mg by mouth once.   iron polysaccharides 150 MG capsule Commonly known as: NIFEREX Take 1 capsule (150 mg total) by mouth daily.   ondansetron 8 MG disintegrating tablet Commonly known as: Zofran ODT Take 1 tablet (8 mg total) by mouth every 8 (eight) hours as needed for nausea or vomiting. Place under tongue to dissolve.   oxyCODONE-acetaminophen 5-325 MG tablet Commonly known as: PERCOCET/ROXICET Take 1-2 tablets by mouth every 6 (six) hours as needed.   PRENATAL VITAMIN PO Take by mouth.       Fatima Blank Certified Nurse-Midwife 08/15/2020 1:38 AM

## 2020-08-14 NOTE — MAU Note (Signed)
Pt reports she noticed  5 days ago a cyst on her left inner thigh near "panty line" Has tried warm compresses without relief. No other complaints at this time. Stated she had a low grade fever earlier 101. Took tylenol and it went away.

## 2020-08-15 DIAGNOSIS — Z3A32 32 weeks gestation of pregnancy: Secondary | ICD-10-CM | POA: Diagnosis not present

## 2020-08-15 DIAGNOSIS — L02214 Cutaneous abscess of groin: Secondary | ICD-10-CM | POA: Diagnosis not present

## 2020-08-15 DIAGNOSIS — O99713 Diseases of the skin and subcutaneous tissue complicating pregnancy, third trimester: Secondary | ICD-10-CM | POA: Diagnosis not present

## 2020-08-15 MED ORDER — OXYCODONE-ACETAMINOPHEN 5-325 MG PO TABS: 1.0000 | ORAL_TABLET | Freq: Four times a day (QID) | ORAL | 0 refills | Status: DC | PRN

## 2020-08-15 MED ORDER — CEFADROXIL 500 MG PO CAPS: 500.0000 mg | ORAL_CAPSULE | Freq: Two times a day (BID) | ORAL | 0 refills | Status: AC

## 2020-08-17 ENCOUNTER — Other Ambulatory Visit: Payer: Self-pay

## 2020-08-17 MED ORDER — POLYSACCHARIDE IRON COMPLEX 150 MG PO CAPS
150.0000 mg | ORAL_CAPSULE | Freq: Every day | ORAL | 1 refills | Status: DC
Start: 2020-08-17 — End: 2020-09-24

## 2020-08-18 ENCOUNTER — Ambulatory Visit: Payer: BC Managed Care – PPO | Admitting: Physical Therapy

## 2020-08-20 ENCOUNTER — Telehealth: Payer: Self-pay | Admitting: Dietician

## 2020-08-20 NOTE — Telephone Encounter (Signed)
Called patient to reschedule her cancelled appointment from 10/20 due to illness. Rescheduled to 08/28/20.

## 2020-08-25 ENCOUNTER — Other Ambulatory Visit: Payer: Self-pay

## 2020-08-25 ENCOUNTER — Ambulatory Visit (INDEPENDENT_AMBULATORY_CARE_PROVIDER_SITE_OTHER): Payer: BC Managed Care – PPO | Admitting: Obstetrics and Gynecology

## 2020-08-25 ENCOUNTER — Ambulatory Visit: Payer: Medicaid Other | Admitting: Physical Therapy

## 2020-08-25 ENCOUNTER — Other Ambulatory Visit (HOSPITAL_COMMUNITY)
Admission: RE | Admit: 2020-08-25 | Discharge: 2020-08-25 | Disposition: A | Discharge status: Home / Self Care | Payer: BC Managed Care – PPO | Source: Ambulatory Visit | Attending: Obstetrics and Gynecology | Admitting: Obstetrics and Gynecology

## 2020-08-25 VITALS — BP 121/73 | HR 91 | Wt 205.3 lb

## 2020-08-25 DIAGNOSIS — Z3A34 34 weeks gestation of pregnancy: Secondary | ICD-10-CM

## 2020-08-25 DIAGNOSIS — L299 Pruritus, unspecified: Secondary | ICD-10-CM | POA: Insufficient documentation

## 2020-08-25 DIAGNOSIS — A599 Trichomoniasis, unspecified: Secondary | ICD-10-CM | POA: Diagnosis present

## 2020-08-25 DIAGNOSIS — O99210 Obesity complicating pregnancy, unspecified trimester: Secondary | ICD-10-CM | POA: Insufficient documentation

## 2020-08-25 DIAGNOSIS — B009 Herpesviral infection, unspecified: Secondary | ICD-10-CM

## 2020-08-25 DIAGNOSIS — O0990 Supervision of high risk pregnancy, unspecified, unspecified trimester: Secondary | ICD-10-CM

## 2020-08-25 DIAGNOSIS — Z6841 Body Mass Index (BMI) 40.0 and over, adult: Secondary | ICD-10-CM | POA: Insufficient documentation

## 2020-08-25 DIAGNOSIS — O24419 Gestational diabetes mellitus in pregnancy, unspecified control: Secondary | ICD-10-CM

## 2020-08-25 DIAGNOSIS — Z8619 Personal history of other infectious and parasitic diseases: Secondary | ICD-10-CM

## 2020-08-25 NOTE — Addendum Note (Signed)
Addended by: Scheryl Marten on: 08/25/2020 04:34 PM   Modules accepted: Orders

## 2020-08-25 NOTE — Progress Notes (Signed)
Prenatal Visit Note Date: 08/25/2020 Clinic: Center for [facility]  Subjective:  Laura Hernandez is a 29 y.o. G2P0010 at [redacted]w[redacted]d being seen today for ongoing prenatal care.  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  Patient reports itching all over. worse at night. has been ongoing throughout entire pregnancy but getting worse.   Contractions: Not present. Vag. Bleeding: None.  Movement: Present. Denies leaking of fluid.   The following portions of the patient's history were reviewed and updated as appropriate: allergies, current medications, past family history, past medical history, past social history, past surgical history and problem list. Problem list updated.  Objective:   Vitals:   08/25/20 1356  BP: 121/73  Pulse: 91  Weight: 205 lb 4.8 oz (93.1 kg)    Fetal Status: Fetal Heart Rate (bpm): 152   Movement: Present     General:  Alert, oriented and cooperative. Patient is in no acute distress.  Skin: Skin is warm and dry. No rash noted.   Cardiovascular: Normal heart rate noted  Respiratory: Normal respiratory effort, no problems with respiration noted  Abdomen: Soft, gravid, appropriate for gestational age. Pain/Pressure: Present     Pelvic:  Cervical exam deferred        Extremities: Normal range of motion.  Edema: Trace  Mental Status: Normal mood and  affect. Normal behavior. Normal judgment and thought content.   Urinalysis:      Assessment and Plan:  Pregnancy: G2P0010 at [redacted]w[redacted]d  1. Gestational diabetes mellitus (GDM) in third trimester, gestational diabetes method of control unspecified Forgot her logbook. Pt to screenshot and send it to [place]. She has had some in the 95-100 range for am fasting a few in the 120-130 range 2h PP. I told her that if they are elevated above a certain number of times that we'll need to start medications and likely would be fine with metformin. I also told her that she'd need qwk u/s if needed to start meds so importance of sending [place] the logbook - [place] MFM OB DETAIL +14 WK; Future  2. Pruritus - Comprehensive metabolic panel - Bile acids, total  3. Supervision of high risk pregnancy, antepartum - [place] MFM OB DETAIL +14 WK; Future - Comprehensive metabolic panel - Bile acids, total  4. Trichomonas infection toc with GBS  5. HSV-2 (herpes simplex virus 2) infection Recommended start valtrex bid for prophylaxis. She states she did not know she had herpes. I looked back and she had a +swab for hsv 2 in may 2013. The note she had some pain and bumps in the GU area and this swabbed. She states she was never told she had anything and was never treated and she desires blood testing. I told her that regardless of the blood testing I recommend tx'ing like she had a h/o hsv 2 which means to give her precautions for any prodromal s/s, lesions and start prophylaxis valtrex b/c if she has an outbreak she would need a c-section. Pt declines this. I told her rationale for this and  seriousness of hsv in newborn.   6. BMI 40.0-44.9, adult (HCC)  7. Obesity in pregnancy  Preterm labor symptoms and general obstetric precautions including but not limited to vaginal bleeding, contractions, leaking of fluid and fetal movement were reviewed in detail with the patient. Please refer to After Visit Summary for other counseling  recommendations.  Return in about 1 week (around 09/01/2020).   [place] Bing, MD

## 2020-08-26 LAB — COMPREHENSIVE METABOLIC PANEL
ALT: 14 IU/L (ref 0–32)
AST: 13 IU/L (ref 0–40)
Albumin/Globulin Ratio: 1.1 — ABNORMAL LOW (ref 1.2–2.2)
Albumin: 3.3 g/dL — ABNORMAL LOW (ref 3.9–5.0)
Alkaline Phosphatase: 158 IU/L — ABNORMAL HIGH (ref 44–121)
BUN/Creatinine Ratio: 8 — ABNORMAL LOW (ref 9–23)
BUN: 5 mg/dL — ABNORMAL LOW (ref 6–20)
Bilirubin Total: 0.3 mg/dL (ref 0.0–1.2)
CO2: 18 mmol/L — ABNORMAL LOW (ref 20–29)
Calcium: 8.9 mg/dL (ref 8.7–10.2)
Chloride: 104 mmol/L (ref 96–106)
Creatinine, Ser: 0.62 mg/dL (ref 0.57–1.00)
GFR calc Af Amer: 142 mL/min/{1.73_m2} (ref >59)
GFR calc non Af Amer: 123 mL/min/{1.73_m2} (ref >59)
Globulin, Total: 3 g/dL (ref 1.5–4.5)
Glucose: 115 mg/dL — ABNORMAL HIGH (ref 65–99)
Potassium: 4.1 mmol/L (ref 3.5–5.2)
Sodium: 135 mmol/L (ref 134–144)
Total Protein: 6.3 g/dL (ref 6.0–8.5)

## 2020-08-26 LAB — HSV(HERPES SMPLX)ABS-I+II(IGG+IGM)-BLD
HSV 1 Glycoprotein G Ab, IgG: 20.1 index — ABNORMAL HIGH (ref 0.00–0.90)
HSV 2 IgG, Type Spec: 13.7 index — ABNORMAL HIGH (ref 0.00–0.90)
HSVI/II Comb IgM: <0.91 Ratio (ref 0.00–0.90)

## 2020-08-26 LAB — BILE ACIDS, TOTAL: Bile Acids Total: 6.7 umol/L (ref 0.0–10.0)

## 2020-08-27 LAB — CERVICOVAGINAL ANCILLARY ONLY
Comment: NEGATIVE
Trichomonas: NEGATIVE

## 2020-08-28 ENCOUNTER — Ambulatory Visit: Payer: BC Managed Care – PPO | Admitting: Dietician

## 2020-08-28 ENCOUNTER — Encounter: Payer: Self-pay | Admitting: Dietician

## 2020-08-28 NOTE — Progress Notes (Signed)
Patient missed her rescheduled appointment today. As this is the second missed appointment with [facility] of 10/06/20, sent notification to referring provider.

## 2020-08-31 ENCOUNTER — Telehealth: Payer: Self-pay

## 2020-08-31 NOTE — Telephone Encounter (Signed)
Pt called stating she was having pelvic pain and wanted to know if she should be seen at the ER. Pt stated baby is moving, denies any vaginal bleeding or leakage of fluid. Advised pt to keep tomorrow's appt and speak with Dr. Shawnie Pons tomorrow. Pt verbalized understanding.

## 2020-09-01 ENCOUNTER — Other Ambulatory Visit: Payer: Self-pay

## 2020-09-01 ENCOUNTER — Encounter: Payer: BC Managed Care – PPO | Admitting: Family Medicine

## 2020-09-01 ENCOUNTER — Ambulatory Visit (INDEPENDENT_AMBULATORY_CARE_PROVIDER_SITE_OTHER): Payer: Medicaid Other | Admitting: Family Medicine

## 2020-09-01 ENCOUNTER — Telehealth: Payer: Self-pay | Admitting: Family Medicine

## 2020-09-01 ENCOUNTER — Ambulatory Visit: Payer: Medicaid Other | Admitting: Physical Therapy

## 2020-09-01 VITALS — BP 115/78 | HR 93 | Wt 207.0 lb

## 2020-09-01 DIAGNOSIS — O99820 Streptococcus B carrier state complicating pregnancy: Secondary | ICD-10-CM

## 2020-09-01 DIAGNOSIS — O24410 Gestational diabetes mellitus in pregnancy, diet controlled: Secondary | ICD-10-CM

## 2020-09-01 DIAGNOSIS — O0990 Supervision of high risk pregnancy, unspecified, unspecified trimester: Secondary | ICD-10-CM

## 2020-09-01 DIAGNOSIS — O0993 Supervision of high risk pregnancy, unspecified, third trimester: Secondary | ICD-10-CM

## 2020-09-01 DIAGNOSIS — Z3A35 35 weeks gestation of pregnancy: Secondary | ICD-10-CM | POA: Diagnosis not present

## 2020-09-01 DIAGNOSIS — B009 Herpesviral infection, unspecified: Secondary | ICD-10-CM

## 2020-09-01 DIAGNOSIS — O361930 Maternal care for other isoimmunization, third trimester, not applicable or unspecified: Secondary | ICD-10-CM

## 2020-09-01 NOTE — Progress Notes (Addendum)
OBSTETRICS PRENATAL VIRTUAL VISIT ENCOUNTER NOTE  Provider location: [facility] for [facility] at [facility]   I connected with Laura Hernandez on 09/02/20 at  1:45 PM EST by MyChart Video Encounter at home and verified that I am speaking with the correct person using two identifiers.   I discussed the limitations, risks, security and privacy concerns of performing an evaluation and management service virtually and the availability of in person appointments. I also discussed with the patient that there may be a patient responsible charge related to this service. The patient expressed understanding and agreed to proceed. Subjective:  Laura Hernandez is a 29 y.o. G2P0010 at [redacted]w[redacted]d being seen today for ongoing prenatal care.  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  Patient reports backache and fatigue.  Contractions: Irritability. Vag. Bleeding: None.  Movement: (!) Decreased. Denies any leaking of fluid.   The following portions of the patient's history were reviewed and updated as appropriate: allergies, current medications, past family history, past medical history, past social history, past surgical history and problem list.   Objective:  There were no vitals filed for this visit.  Fetal Status:     Movement: (!) Decreased     General:  Alert, oriented and cooperative. Patient is  in no acute distress.  Respiratory: Normal respiratory effort, no problems with respiration noted  Mental Status: Normal mood and affect. Normal behavior. Normal judgment and thought content.  Rest of physical exam deferred due to type of encounter  Imaging: No results found.  Assessment and Plan:  Pregnancy: G2P0010 at [redacted]w[redacted]d 1. Diet controlled gestational diabetes mellitus (GDM) in third trimester Highest CBGs this week is 128, other CBGs are 98, 128, 132, 120, 91 Fasting 98,94  2. Urinary tract colonization by group B Streptococcus affecting pregnancy Will need treatment in labor  3. 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 HSV-2 (herpes simplex virus 2) infection Denies this diagnosis, though culture was +ve in 2013 and declines ppx, reviewed with patient.  5. Supervision of high risk pregnancy, antepartum Fall out and getting dizzy, feels clammy and week having a lot of pain in her lower backand lower abdomen and in her legs. Having swelling in her feet and hands. Having terrible itching, and it is keeping her up. Having hives related the itching. Does not have a blood pressure machine Reports decreased fetal movement.   Recommend in-person visit today for BP check and FHR monitoring.  Preterm labor symptoms and general obstetric precautions including but not limited to vaginal bleeding, contractions, leaking of fluid and fetal movement were reviewed in detail with the patient. I discussed the assessment and treatment plan with the patient. The patient was provided an opportunity to ask questions and all were answered. The patient agreed with the plan and demonstrated an understanding of the instructions. The patient was advised to call back or seek an in-person office evaluation/go to MAU at [facility] for any urgent or concerning symptoms. Please refer to After Visit  Summary for other counseling recommendations.   I provided  12 minutes of face-to-face time during this encounter.  No follow-ups on file.  Future Appointments  Date Time Provider Department Center  09/03/2020  8:00 AM [facility] NURSE [facility]  09/03/2020  8:15 AM [facility] US2 [facility]  09/08/2020 10:00 AM Mariane Masters, PT [facility] None  09/08/2020  1:45 PM Reva Bores, MD [facility]  09/15/2020 10:00 AM Mariane Masters, PT [facility] None    Reva Bores, MD Center for [facility]

## 2020-09-01 NOTE — Progress Notes (Signed)
I connected with  Laura Hernandez on 09/01/20 at  1:45 PM EST by telephone and verified that I am speaking with the correct person using two identifiers.   I discussed the limitations, risks, security and privacy concerns of performing an evaluation and management service by telephone and the availability of in person appointments. I also discussed with the patient that there may be a patient responsible charge related to this service. The patient expressed understanding and agreed to proceed.  Scheryl Marten, RN 09/01/2020  1:52 PM    Having dizzy spells today

## 2020-09-02 DIAGNOSIS — B009 Herpesviral infection, unspecified: Secondary | ICD-10-CM

## 2020-09-02 NOTE — Progress Notes (Signed)
   PRENATAL VISIT NOTE  Subjective:  Laura Hernandez is a 29 y.o. G2P0010 at [redacted]w[redacted]d being seen today for ongoing prenatal care.  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  Patient reports backache, fatigue and edema.   .  .   . Denies leaking of fluid.   The following portions of the patient's history were reviewed and updated as appropriate: allergies, current medications, past family history, past medical history, past social history, past surgical history and problem list.   Objective:   Vitals:   09/01/20 1544  BP: 115/78  Pulse: 93  Weight: 207 lb (93.9 kg)    Fetal Status:           General:  Alert, oriented and cooperative. Patient is in no acute distress.  Skin: Skin is warm and dry. No rash noted.   Cardiovascular: Normal heart rate noted  Respiratory: Normal respiratory effort, no problems with respiration noted  Abdomen: Soft, gravid, appropriate for gestational age.        Pelvic: Cervical exam deferred        Extremities: Normal range of motion.     Mental Status: Normal mood and affect. Normal behavior. Normal judgment and thought content.   Assessment and Plan:  Pregnancy: G2P0010 at [redacted]w[redacted]d 1. Supervision of high risk pregnancy, antepartum See note from earlier today. BP is normal. NST:  Baseline: 145 bpm, Variability: Good {> 6 bpm), Accelerations: Reactive and  Decelerations: Absent  2. Diet controlled gestational diabetes mellitus (GDM) in third trimester         Media Information     3. HSV-2 (herpes simplex virus 2) infection Declined ppx.  Preterm labor symptoms and general obstetric precautions including but not limited to vaginal bleeding, contractions, leaking of fluid and fetal movement were reviewed in detail with the patient. Please refer to After Visit Summary for other counseling recommendations.   No follow-ups on file.  Future Appointments  Date Time Provider Department Center  09/03/2020  8:00 AM [facility] NURSE [facility]  09/03/2020  8:15 AM [facility] US2 [facility]  09/08/2020 10:00 AM Mariane Masters, PT [facility] None  09/08/2020  1:45 PM Reva Bores, MD [facility]  09/15/2020 10:00 AM Mariane Masters, PT [facility] None    Reva Bores, MD

## 2020-09-03 ENCOUNTER — Other Ambulatory Visit: Payer: Self-pay | Admitting: Obstetrics and Gynecology

## 2020-09-03 ENCOUNTER — Encounter: Payer: Self-pay | Admitting: *Deleted

## 2020-09-03 ENCOUNTER — Ambulatory Visit: Payer: Medicaid Other | Attending: Obstetrics and Gynecology

## 2020-09-03 ENCOUNTER — Other Ambulatory Visit: Payer: Self-pay | Admitting: *Deleted

## 2020-09-03 ENCOUNTER — Ambulatory Visit: Payer: Medicaid Other | Admitting: *Deleted

## 2020-09-03 ENCOUNTER — Other Ambulatory Visit: Payer: Self-pay

## 2020-09-03 VITALS — BP 124/66 | HR 95

## 2020-09-03 DIAGNOSIS — O24419 Gestational diabetes mellitus in pregnancy, unspecified control: Secondary | ICD-10-CM | POA: Insufficient documentation

## 2020-09-03 DIAGNOSIS — O0990 Supervision of high risk pregnancy, unspecified, unspecified trimester: Secondary | ICD-10-CM

## 2020-09-03 DIAGNOSIS — O24410 Gestational diabetes mellitus in pregnancy, diet controlled: Secondary | ICD-10-CM | POA: Insufficient documentation

## 2020-09-03 MED ORDER — VALACYCLOVIR HCL 1 G PO TABS: 500.0000 mg | ORAL_TABLET | Freq: Two times a day (BID) | ORAL | 1 refills | Status: DC

## 2020-09-03 NOTE — Telephone Encounter (Signed)
Called patient back. She would really like to have a water birth and was afraid that the Valtrex would interfere with that. I reassured her it would not and that it is the best prevention for outbreaks at time of labor--she is agreeable to taking it. Rx sent in.

## 2020-09-06 ENCOUNTER — Encounter (HOSPITAL_COMMUNITY): Payer: Self-pay | Admitting: Obstetrics & Gynecology

## 2020-09-06 ENCOUNTER — Inpatient Hospital Stay (HOSPITAL_BASED_OUTPATIENT_CLINIC_OR_DEPARTMENT_OTHER): Payer: Medicaid Other

## 2020-09-06 ENCOUNTER — Observation Stay (HOSPITAL_COMMUNITY)
Admission: AD | Admit: 2020-09-06 | Discharge: 2020-09-09 | Disposition: A | Discharge status: Home / Self Care | Payer: Medicaid Other | Attending: Obstetrics and Gynecology | Admitting: Obstetrics and Gynecology

## 2020-09-06 ENCOUNTER — Other Ambulatory Visit: Payer: Self-pay

## 2020-09-06 DIAGNOSIS — B951 Streptococcus, group B, as the cause of diseases classified elsewhere: Secondary | ICD-10-CM | POA: Insufficient documentation

## 2020-09-06 DIAGNOSIS — N888 Other specified noninflammatory disorders of cervix uteri: Secondary | ICD-10-CM | POA: Diagnosis not present

## 2020-09-06 DIAGNOSIS — O99820 Streptococcus B carrier state complicating pregnancy: Secondary | ICD-10-CM

## 2020-09-06 DIAGNOSIS — O24419 Gestational diabetes mellitus in pregnancy, unspecified control: Secondary | ICD-10-CM | POA: Diagnosis present

## 2020-09-06 DIAGNOSIS — R102 Pelvic and perineal pain: Secondary | ICD-10-CM | POA: Diagnosis not present

## 2020-09-06 DIAGNOSIS — B009 Herpesviral infection, unspecified: Secondary | ICD-10-CM | POA: Diagnosis present

## 2020-09-06 DIAGNOSIS — O26893 Other specified pregnancy related conditions, third trimester: Secondary | ICD-10-CM | POA: Diagnosis not present

## 2020-09-06 DIAGNOSIS — O4690 Antepartum hemorrhage, unspecified, unspecified trimester: Secondary | ICD-10-CM | POA: Diagnosis present

## 2020-09-06 DIAGNOSIS — O99210 Obesity complicating pregnancy, unspecified trimester: Secondary | ICD-10-CM

## 2020-09-06 DIAGNOSIS — O0990 Supervision of high risk pregnancy, unspecified, unspecified trimester: Secondary | ICD-10-CM

## 2020-09-06 DIAGNOSIS — Z6841 Body Mass Index (BMI) 40.0 and over, adult: Secondary | ICD-10-CM

## 2020-09-06 DIAGNOSIS — O4693 Antepartum hemorrhage, unspecified, third trimester: Secondary | ICD-10-CM

## 2020-09-06 DIAGNOSIS — Z20822 Contact with and (suspected) exposure to covid-19: Secondary | ICD-10-CM | POA: Diagnosis not present

## 2020-09-06 DIAGNOSIS — O26853 Spotting complicating pregnancy, third trimester: Principal | ICD-10-CM | POA: Insufficient documentation

## 2020-09-06 DIAGNOSIS — Z91040 Latex allergy status: Secondary | ICD-10-CM | POA: Insufficient documentation

## 2020-09-06 DIAGNOSIS — Z3A35 35 weeks gestation of pregnancy: Secondary | ICD-10-CM

## 2020-09-06 DIAGNOSIS — O361930 Maternal care for other isoimmunization, third trimester, not applicable or unspecified: Secondary | ICD-10-CM | POA: Diagnosis present

## 2020-09-06 DIAGNOSIS — M5430 Sciatica, unspecified side: Secondary | ICD-10-CM | POA: Diagnosis present

## 2020-09-06 DIAGNOSIS — O23593 Infection of other part of genital tract in pregnancy, third trimester: Secondary | ICD-10-CM | POA: Diagnosis present

## 2020-09-06 LAB — URINALYSIS, ROUTINE W REFLEX MICROSCOPIC
Bilirubin Urine: NEGATIVE
Glucose, UA: NEGATIVE mg/dL
Ketones, ur: NEGATIVE mg/dL
Nitrite: NEGATIVE
Protein, ur: NEGATIVE mg/dL
Specific Gravity, Urine: 1.009 (ref 1.005–1.030)
pH: 7 (ref 5.0–8.0)

## 2020-09-06 LAB — CBC
HCT: 34.4 % — ABNORMAL LOW (ref 36.0–46.0)
Hemoglobin: 10.2 g/dL — ABNORMAL LOW (ref 12.0–15.0)
MCH: 20.7 pg — ABNORMAL LOW (ref 26.0–34.0)
MCHC: 29.7 g/dL — ABNORMAL LOW (ref 30.0–36.0)
MCV: 69.9 fL — ABNORMAL LOW (ref 80.0–100.0)
Platelets: 248 10*3/uL (ref 150–400)
RBC: 4.92 MIL/uL (ref 3.87–5.11)
RDW: 17 % — ABNORMAL HIGH (ref 11.5–15.5)
WBC: 11.2 10*3/uL — ABNORMAL HIGH (ref 4.0–10.5)
nRBC: 0 % (ref 0.0–0.2)

## 2020-09-06 LAB — COMPREHENSIVE METABOLIC PANEL
ALT: 15 U/L (ref 0–44)
AST: 16 U/L (ref 15–41)
Albumin: 2.4 g/dL — ABNORMAL LOW (ref 3.5–5.0)
Alkaline Phosphatase: 134 U/L — ABNORMAL HIGH (ref 38–126)
Anion gap: 8 (ref 5–15)
BUN: <5 mg/dL — ABNORMAL LOW (ref 6–20)
CO2: 21 mmol/L — ABNORMAL LOW (ref 22–32)
Calcium: 9.2 mg/dL (ref 8.9–10.3)
Chloride: 106 mmol/L (ref 98–111)
Creatinine, Ser: 0.64 mg/dL (ref 0.44–1.00)
GFR, Estimated: >60 mL/min (ref >60)
Glucose, Bld: 120 mg/dL — ABNORMAL HIGH (ref 70–99)
Potassium: 4.1 mmol/L (ref 3.5–5.1)
Sodium: 135 mmol/L (ref 135–145)
Total Bilirubin: 0.4 mg/dL (ref 0.3–1.2)
Total Protein: 6.2 g/dL — ABNORMAL LOW (ref 6.5–8.1)

## 2020-09-06 LAB — GLUCOSE, CAPILLARY: Glucose-Capillary: 101 mg/dL — ABNORMAL HIGH (ref 70–99)

## 2020-09-06 LAB — PREPARE RBC (CROSSMATCH)

## 2020-09-06 LAB — WET PREP, GENITAL
Sperm: NONE SEEN
Trich, Wet Prep: NONE SEEN

## 2020-09-06 LAB — RESPIRATORY PANEL BY RT PCR (FLU A&B, COVID)
Influenza A by PCR: NEGATIVE
Influenza B by PCR: NEGATIVE
SARS Coronavirus 2 by RT PCR: NEGATIVE

## 2020-09-06 LAB — APTT: aPTT: 31 seconds (ref 24–36)

## 2020-09-06 LAB — RAPID URINE DRUG SCREEN, HOSP PERFORMED
Amphetamines: NOT DETECTED
Barbiturates: NOT DETECTED
Benzodiazepines: NOT DETECTED
Cocaine: NOT DETECTED
Opiates: NOT DETECTED
Tetrahydrocannabinol: NOT DETECTED

## 2020-09-06 LAB — ABO/RH: ABO/RH(D): B POS

## 2020-09-06 LAB — PROTIME-INR
INR: 1 (ref 0.8–1.2)
Prothrombin Time: 12.3 seconds (ref 11.4–15.2)

## 2020-09-06 LAB — FIBRINOGEN: Fibrinogen: 693 mg/dL — ABNORMAL HIGH (ref 210–475)

## 2020-09-06 MED ORDER — DOCUSATE SODIUM 100 MG PO CAPS: 100.0000 mg | ORAL_CAPSULE | Freq: Every day | ORAL | Status: DC

## 2020-09-06 MED ORDER — CALCIUM CARBONATE ANTACID 500 MG PO CHEW
2.0000 | CHEWABLE_TABLET | ORAL | Status: DC | PRN
  Administered 2020-09-07: 400 mg via ORAL
  Filled 2020-09-06: qty 2

## 2020-09-06 MED ORDER — LACTATED RINGERS IV SOLN: INTRAVENOUS | Status: DC

## 2020-09-06 MED ORDER — METRONIDAZOLE 500 MG PO TABS
500.0000 mg | ORAL_TABLET | Freq: Two times a day (BID) | ORAL | Status: DC
  Filled 2020-09-06: qty 1

## 2020-09-06 MED ORDER — ZOLPIDEM TARTRATE 5 MG PO TABS: 5.0000 mg | ORAL_TABLET | Freq: Every evening | ORAL | Status: DC | PRN

## 2020-09-06 MED ORDER — ACETAMINOPHEN 325 MG PO TABS
650.0000 mg | ORAL_TABLET | ORAL | Status: DC | PRN
  Administered 2020-09-07: 650 mg via ORAL
  Filled 2020-09-06: qty 2

## 2020-09-06 MED ORDER — VALACYCLOVIR HCL 500 MG PO TABS
500.0000 mg | ORAL_TABLET | Freq: Two times a day (BID) | ORAL | Status: DC
  Filled 2020-09-06 (×2): qty 1

## 2020-09-06 MED ORDER — FLUCONAZOLE 150 MG PO TABS
150.0000 mg | ORAL_TABLET | Freq: Once | ORAL | Status: AC
  Administered 2020-09-06: 150 mg via ORAL
  Filled 2020-09-06: qty 1

## 2020-09-06 MED ORDER — SODIUM CHLORIDE 0.9% IV SOLUTION: Freq: Once | INTRAVENOUS | Status: DC

## 2020-09-06 MED ORDER — PRENATAL MULTIVITAMIN CH
1.0000 | ORAL_TABLET | Freq: Every day | ORAL | Status: DC
  Administered 2020-09-07 – 2020-09-09 (×3): 1 via ORAL
  Filled 2020-09-06 (×3): qty 1

## 2020-09-06 MED ORDER — BETAMETHASONE SOD PHOS & ACET 6 (3-3) MG/ML IJ SUSP
12.0000 mg | INTRAMUSCULAR | Status: AC
  Administered 2020-09-06 – 2020-09-07 (×2): 12 mg via INTRAMUSCULAR
  Filled 2020-09-06: qty 5

## 2020-09-06 NOTE — Progress Notes (Signed)
Patient reports frustration due to NPO status and feeling very hungry. Patient reports feeling "sick to stomach." RN provided education about rationale for therapy and concerns regarding vaginal bleeding and noted contractions. Patient refusing flagyl and valtrex medications. Patient education provided by RN.

## 2020-09-06 NOTE — MAU Provider Note (Signed)
History     CSN: [id number]  Arrival date and time: 09/06/20 5364   First Provider Initiated Contact with Patient 09/06/20 1906      Chief Complaint  Patient presents with  . Abdominal Pain  . Back Pain  . Vaginal Bleeding   HPI   Ms.Laura Hernandez is a 29 y.o. female with a history of GDM & HSV,  G2P0010 @ 48w5dhere with lower abdominal pain/cramping and vaginal bleeding. She reports both symptoms started yesterday and progressed to worsening pain today. She reports soaking a panty liner with bloody discharge. No recently intercourse. No bleeding while here in MAU. Reports good fetal movement. Hx of genital herpes; not taking valtrex because she does not feel like she needs it. The pain is all across her lower abdomen and radiates around to her lower back. The pain comes and goes. She has not taken anything for the pain.   OB History    Gravida  2   Para      Term      Preterm      AB  1   Living        SAB  1   TAB      Ectopic      Multiple      Live Births              Past Medical History:  Diagnosis Date  . Bronchitis   . Chlamydia   . Gestational diabetes   . History of gonorrhea 09/18/2013   2012    . History of trichomonal vaginitis 09/18/2013   2012    . Migraines 2012  . Trichomonas     Past Surgical History:  Procedure Laterality Date  . NO PAST SURGERIES      Family History  Problem Relation Age of Onset  . Asthma Mother   . Arthritis Mother   . Diabetes Mother   . Heart disease Mother   . Hypertension Mother   . Stroke Mother   . Kidney disease Mother   . Vision loss Mother   . Diabetes Maternal Grandmother   . Hypertension Maternal Grandmother     Social History   Tobacco Use  . Smoking status: Never Smoker  . Smokeless tobacco: Never Used  Vaping Use  . Vaping Use: Never used  Substance Use Topics  . Alcohol use: No  . Drug use: No    Allergies:  Allergies  Allergen Reactions  . Latex Itching and Rash     Medications Prior to Admission  Medication Sig Dispense Refill Last Dose  . Prenatal Vit-Fe Fumarate-FA (PRENATAL VITAMIN PO) Take by mouth.   Past Month at Unknown time  . Accu-Chek Softclix Lancets lancets 1 each by Other route 4 (four) times daily. 100 each 12   . acetaminophen (TYLENOL) 500 MG tablet Take 1,000 mg by mouth 2 (two) times daily as needed for pain.      .Marland Kitchenalbuterol (PROVENTIL HFA;VENTOLIN HFA) 108 (90 Base) MCG/ACT inhaler Inhale 1-2 puffs into the lungs every 6 (six) hours as needed for wheezing or shortness of breath. (Patient not taking: Reported on 09/03/2020) 1 Inhaler 0   . Blood Glucose Monitoring Suppl (ACCU-CHEK GUIDE) w/Device KIT 1 Device by Does not apply route 4 (four) times daily. 1 kit 0   . cyclobenzaprine (FLEXERIL) 10 MG tablet Take 1 tablet (10 mg total) by mouth 3 (three) times daily as needed for muscle spasms. 30 tablet 1 09/03/2020  .  glucose blood (ACCU-CHEK GUIDE) test strip Use to check blood sugars four times a day was instructed 50 each 12   . iron polysaccharides (NIFEREX) 150 MG capsule Take 1 capsule (150 mg total) by mouth daily. 30 capsule 1 09/03/2020  . valACYclovir (VALTREX) 1000 MG tablet Take 0.5 tablets (500 mg total) by mouth 2 (two) times daily. 75 tablet 1    Results for orders placed or performed during the hospital encounter of 09/06/20 (from the past 48 hour(s))  Urinalysis, Routine w reflex microscopic Urine, Clean Catch     Status: Abnormal   Collection Time: 09/06/20  7:00 PM  Result Value Ref Range   Color, Urine YELLOW YELLOW   APPearance HAZY (A) CLEAR   Specific Gravity, Urine 1.009 1.005 - 1.030   pH 7.0 5.0 - 8.0   Glucose, UA NEGATIVE NEGATIVE mg/dL   Hgb urine dipstick MODERATE (A) NEGATIVE   Bilirubin Urine NEGATIVE NEGATIVE   Ketones, ur NEGATIVE NEGATIVE mg/dL   Protein, ur NEGATIVE NEGATIVE mg/dL   Nitrite NEGATIVE NEGATIVE   Leukocytes,Ua LARGE (A) NEGATIVE   RBC / HPF 6-10 0 - 5 RBC/hpf   WBC, UA 6-10 0  - 5 WBC/hpf   Bacteria, UA FEW (A) NONE SEEN   Squamous Epithelial / LPF 11-20 0 - 5   Mucus PRESENT     Comment: Performed at [facility], [address], [place], [place] [address]  Wet prep, genital     Status: Abnormal   Collection Time: 09/06/20  7:24 PM  Result Value Ref Range   Yeast Wet Prep HPF POC PRESENT (A) NONE SEEN   Trich, Wet Prep NONE SEEN NONE SEEN   Clue Cells Wet Prep HPF POC PRESENT (A) NONE SEEN   WBC, Wet Prep HPF POC MANY (A) NONE SEEN   Sperm NONE SEEN     Comment: Performed at [facility], [place] [address] [place], [place] [address]   Review of Systems  Gastrointestinal: Positive for abdominal pain.  Genitourinary: Positive for pelvic pain, vaginal bleeding and vaginal discharge.   Physical Exam   Blood pressure 136/86, pulse 79, temperature 98.4 F (36.9 C), temperature source Oral, resp. rate 17, height 5' (1.524 m), weight 93.9 kg, last menstrual period 12/31/2019, SpO2 100 %, unknown if currently breastfeeding.   Patient Vitals for the past 24 hrs:  BP Temp Temp src Pulse Resp SpO2 Height Weight  09/06/20 1850 136/86 98.4 F (36.9 C) Oral 79 17 100 % 5' (1.524 m) 93.9 kg   Physical Exam Constitutional:      General: She is not in acute distress.    Appearance: She is well-developed. She is not ill-appearing, toxic-appearing or diaphoretic.  HENT:     Head: Normocephalic.  Abdominal:     Tenderness: There is generalized abdominal tenderness. There is no guarding or rebound.  Genitourinary:    Vagina: Vaginal discharge and tenderness present.     Cervix: Friability present.     Comments: Vagina - Small amount of pink vaginal discharge, difficult to examine patient d/t tolerance of exam. Thick, clumpy discharge consistent with yeast on speculum   Cervix - + contact bleeding, + active bleeding (small amount- bright red blood) tiny, small, clots Bimanual exam: Cervix closed, anterior.  GC/Chlam, wet prep done Chaperone present for  exam.  Neurological:     Mental Status: She is alert.    Fetal Tracing: Baseline: 135 bpm Variability: Moderate  Accelerations: 15x15 Decelerations: None Toco: UI  MAU Course  Procedures  None  MDM  B positive blood type.  Wet prep & GC collected Dr. Harolyn Rutherford down in MAU to see patient. Will admit for bleeding of unknown origin.   Assessment and Plan   A:  1. [redacted] weeks gestation of pregnancy   2. Vaginal bleeding during pregnancy     P:  Admit OB speciality BMZ ordered Recommend Valtrex- patient previously refused home treatment.  Yeast treatment Continuous Fetal monitoring NPO status.   Lezlie Lye, NP 09/06/2020 8:06 PM

## 2020-09-06 NOTE — Progress Notes (Signed)
Asked by [facility] to provide prenatal consultation for 29 y.o. G2 P0 who is now 35.[redacted] weeks EGA and was admitted tonight with vaginal bleeding and UCs; pregnancy complicated by gestational DM and PHx HSV.  She was given a first dose of betamethasone about 8pm.  Discussed with patient usual expectations for preterm infant at  68 - [redacted] weeks gestation, including possible need for transfer to NICU, DR resuscitation, respiratory support, IV access. Also presented usual criteria for discharge. Discussed advantages of feeding with mother's milk and possible use of donor milk as "bridge" if needed until her supply is sufficient.  Patient was attentive, had appropriate questions, and was appreciative of my input.  Thank you for consulting Neonatology.  Total time 25 minutes, face-to-face time 15 minutes.  JWimmer, MD

## 2020-09-06 NOTE — H&P (Signed)
History    CSN: [id number]   Arrival date and time: 09/06/20 8676    First Provider Initiated Contact with Patient 09/06/20 1906          Chief Complaint  Patient presents with  . Abdominal Pain  . Back Pain  . Vaginal Bleeding    HPI    Ms.Laura Hernandez is a 29 y.o. female with a history of GDM & HSV,  G2P0010 @ 24w5dhere with lower abdominal pain/cramping and vaginal bleeding. She reports both symptoms started yesterday and progressed to worsening pain today. She reports soaking a panty liner with bloody discharge. No recently intercourse. No bleeding while here in MAU. Reports good fetal movement. Hx of genital herpes; not taking valtrex because she does not feel like she needs it. The pain is all across her lower abdomen and radiates around to her lower back. The pain comes and goes. She has not taken anything for the pain.            OB History     Gravida  2   Para      Term      Preterm      AB  1   Living         SAB  1   TAB      Ectopic      Multiple      Live Births                      Past Medical History:  Diagnosis Date  . Bronchitis    . Chlamydia    . Gestational diabetes    . History of gonorrhea 09/18/2013    2012    . History of trichomonal vaginitis 09/18/2013    2012    . Migraines 2012  . Trichomonas             Past Surgical History:  Procedure Laterality Date  . NO PAST SURGERIES               Family History  Problem Relation Age of Onset  . Asthma Mother    . Arthritis Mother    . Diabetes Mother    . Heart disease Mother    . Hypertension Mother    . Stroke Mother    . Kidney disease Mother    . Vision loss Mother    . Diabetes Maternal Grandmother    . Hypertension Maternal Grandmother        Social History        Tobacco Use  . Smoking status: Never Smoker  . Smokeless tobacco: Never Used  Vaping Use  . Vaping Use: Never used  Substance Use Topics  . Alcohol use: No  . Drug use: No        Allergies:      Allergies  Allergen Reactions  . Latex Itching and Rash             Medications Prior to Admission  Medication Sig Dispense Refill Last Dose  . Prenatal Vit-Fe Fumarate-FA (PRENATAL VITAMIN PO) Take by mouth.     Past Month at Unknown time  . Accu-Chek Softclix Lancets lancets 1 each by Other route 4 (four) times daily. 100 each 12    . acetaminophen (TYLENOL) 500 MG tablet Take 1,000 mg by mouth 2 (two) times daily as needed for pain.         .Marland Kitchenalbuterol (PROVENTIL HFA;VENTOLIN HFA) 108 (  90 Base) MCG/ACT inhaler Inhale 1-2 puffs into the lungs every 6 (six) hours as needed for wheezing or shortness of breath. (Patient not taking: Reported on 09/03/2020) 1 Inhaler 0    . Blood Glucose Monitoring Suppl (ACCU-CHEK GUIDE) w/Device KIT 1 Device by Does not apply route 4 (four) times daily. 1 kit 0    . cyclobenzaprine (FLEXERIL) 10 MG tablet Take 1 tablet (10 mg total) by mouth 3 (three) times daily as needed for muscle spasms. 30 tablet 1 09/03/2020  . glucose blood (ACCU-CHEK GUIDE) test strip Use to check blood sugars four times a day was instructed 50 each 12    . iron polysaccharides (NIFEREX) 150 MG capsule Take 1 capsule (150 mg total) by mouth daily. 30 capsule 1 09/03/2020  . valACYclovir (VALTREX) 1000 MG tablet Take 0.5 tablets (500 mg total) by mouth 2 (two) times daily. 75 tablet 1      Lab Results Last 48 Hours        Results for orders placed or performed during the hospital encounter of 09/06/20 (from the past 48 hour(s))  Urinalysis, Routine w reflex microscopic Urine, Clean Catch     Status: Abnormal    Collection Time: 09/06/20  7:00 PM  Result Value Ref Range    Color, Urine YELLOW YELLOW    APPearance HAZY (A) CLEAR    Specific Gravity, Urine 1.009 1.005 - 1.030    pH 7.0 5.0 - 8.0    Glucose, UA NEGATIVE NEGATIVE mg/dL    Hgb urine dipstick MODERATE (A) NEGATIVE    Bilirubin Urine NEGATIVE NEGATIVE    Ketones, ur NEGATIVE NEGATIVE mg/dL     Protein, ur NEGATIVE NEGATIVE mg/dL    Nitrite NEGATIVE NEGATIVE    Leukocytes,Ua LARGE (A) NEGATIVE    RBC / HPF 6-10 0 - 5 RBC/hpf    WBC, UA 6-10 0 - 5 WBC/hpf    Bacteria, UA FEW (A) NONE SEEN    Squamous Epithelial / LPF 11-20 0 - 5    Mucus PRESENT        Comment: Performed at [facility], [address], [place], [place] [address]  Wet prep, genital     Status: Abnormal    Collection Time: 09/06/20  7:24 PM  Result Value Ref Range    Yeast Wet Prep HPF POC PRESENT (A) NONE SEEN    Trich, Wet Prep NONE SEEN NONE SEEN    Clue Cells Wet Prep HPF POC PRESENT (A) NONE SEEN    WBC, Wet Prep HPF POC MANY (A) NONE SEEN    Sperm NONE SEEN        Comment: Performed at [facility], [place] [address], [place], [place] [address]      Review of Systems  Gastrointestinal: Positive for abdominal pain.  Genitourinary: Positive for pelvic pain, vaginal bleeding and vaginal discharge.    Physical Exam    Blood pressure 136/86, pulse 79, temperature 98.4 F (36.9 C), temperature source Oral, resp. rate 17, height 5' (1.524 m), weight 93.9 kg, last menstrual period 12/31/2019, SpO2 100 %, unknown if currently breastfeeding.    Patient Vitals for the past 24 hrs:   BP Temp Temp src Pulse Resp SpO2 Height Weight  09/06/20 1850 136/86 98.4 F (36.9 C) Oral 79 17 100 % 5' (1.524 m) 93.9 kg    Physical Exam Constitutional:      General: She is not in acute distress.    Appearance: She is well-developed. She  is not ill-appearing, toxic-appearing or diaphoretic.  HENT:     Head: Normocephalic.  Abdominal:     Tenderness: There is generalized abdominal tenderness. There is no guarding or rebound.  Genitourinary:    Vagina: Vaginal discharge and tenderness present.     Cervix: Friability present.     Comments: Vagina - Small amount of pink vaginal discharge, difficult to examine patient d/t tolerance of exam. Thick, clumpy discharge consistent with yeast on speculum   Cervix - +  contact bleeding, + active bleeding (small amount- bright red blood) tiny, small, clots Bimanual exam: Cervix closed, anterior.  GC/Chlam, wet prep done Chaperone present for exam.  Neurological:     Mental Status: She is alert.     Fetal Tracing: Baseline: 135 bpm Variability: Moderate  Accelerations: 15x15 Decelerations: None Toco: UI   MAU Course  Procedures  None   MDM   B positive blood type.  Wet prep & GC collected Dr. Harolyn Rutherford down in MAU to see patient. Will admit for bleeding of unknown origin.    Results for orders placed or performed during the hospital encounter of 09/06/20 (from the past 24 hour(s))  Urinalysis, Routine w reflex microscopic Urine, Clean Catch     Status: Abnormal   Collection Time: 09/06/20  7:00 PM  Result Value Ref Range   Color, Urine YELLOW YELLOW   APPearance HAZY (A) CLEAR   Specific Gravity, Urine 1.009 1.005 - 1.030   pH 7.0 5.0 - 8.0   Glucose, UA NEGATIVE NEGATIVE mg/dL   Hgb urine dipstick MODERATE (A) NEGATIVE   Bilirubin Urine NEGATIVE NEGATIVE   Ketones, ur NEGATIVE NEGATIVE mg/dL   Protein, ur NEGATIVE NEGATIVE mg/dL   Nitrite NEGATIVE NEGATIVE   Leukocytes,Ua LARGE (A) NEGATIVE   RBC / HPF 6-10 0 - 5 RBC/hpf   WBC, UA 6-10 0 - 5 WBC/hpf   Bacteria, UA FEW (A) NONE SEEN   Squamous Epithelial / LPF 11-20 0 - 5   Mucus PRESENT   Wet prep, genital     Status: Abnormal   Collection Time: 09/06/20  7:24 PM  Result Value Ref Range   Yeast Wet Prep HPF POC PRESENT (A) NONE SEEN   Trich, Wet Prep NONE SEEN NONE SEEN   Clue Cells Wet Prep HPF POC PRESENT (A) NONE SEEN   WBC, Wet Prep HPF POC MANY (A) NONE SEEN   Sperm NONE SEEN   Respiratory Panel by RT PCR (Flu A&B, Covid) - Nasopharyngeal Swab     Status: None   Collection Time: 09/06/20  7:46 PM   Specimen: Nasopharyngeal Swab  Result Value Ref Range   SARS Coronavirus 2 by RT PCR NEGATIVE NEGATIVE   Influenza A by PCR NEGATIVE NEGATIVE   Influenza B by PCR NEGATIVE  NEGATIVE  Type and screen     Status: None (Preliminary result)   Collection Time: 09/06/20  8:30 PM  Result Value Ref Range   ABO/RH(D) B POS    Antibody Screen NEG    Sample Expiration 09/09/2020,2359    Unit Number [id number]    Blood Component Type RBC LR PHER2    Unit division 00    Status of Unit ALLOCATED    Transfusion Status OK TO TRANSFUSE    Crossmatch Result      Compatible Performed at [facility], [address] [place], [place] [address]    Unit Number [id number]    Blood Component Type RED CELLS,LR    Unit division 00  Status of Unit ALLOCATED    Transfusion Status OK TO TRANSFUSE    Crossmatch Result Compatible   CBC on admission     Status: Abnormal   Collection Time: 09/06/20  8:30 PM  Result Value Ref Range   WBC 11.2 (H) 4.0 - 10.5 K/uL   RBC 4.92 3.87 - 5.11 MIL/uL   Hemoglobin 10.2 (L) 12.0 - 15.0 g/dL   HCT 34.4 (L) 36 - 46 %   MCV 69.9 (L) 80.0 - 100.0 fL   MCH 20.7 (L) 26.0 - 34.0 pg   MCHC 29.7 (L) 30.0 - 36.0 g/dL   RDW 17.0 (H) 11.5 - 15.5 %   Platelets 248 150 - 400 K/uL   nRBC 0.0 0.0 - 0.2 %  Fibrinogen     Status: Abnormal   Collection Time: 09/06/20  8:30 PM  Result Value Ref Range   Fibrinogen 693 (H) 210 - 475 mg/dL  Protime-INR     Status: None   Collection Time: 09/06/20  8:30 PM  Result Value Ref Range   Prothrombin Time 12.3 11.4 - 15.2 seconds   INR 1.0 0.8 - 1.2  APTT     Status: None   Collection Time: 09/06/20  8:30 PM  Result Value Ref Range   aPTT 31 24 - 36 seconds  Comprehensive metabolic panel     Status: Abnormal   Collection Time: 09/06/20  8:30 PM  Result Value Ref Range   Sodium 135 135 - 145 mmol/L   Potassium 4.1 3.5 - 5.1 mmol/L   Chloride 106 98 - 111 mmol/L   CO2 21 (L) 22 - 32 mmol/L   Glucose, Bld 120 (H) 70 - 99 mg/dL   BUN <5 (L) 6 - 20 mg/dL   Creatinine, Ser 0.64 0.44 - 1.00 mg/dL   Calcium 9.2 8.9 - 10.3 mg/dL   Total Protein 6.2 (L) 6.5 - 8.1 g/dL   Albumin 2.4 (L) 3.5 - 5.0  g/dL   AST 16 15 - 41 U/L   ALT 15 0 - 44 U/L   Alkaline Phosphatase 134 (H) 38 - 126 U/L   Total Bilirubin 0.4 0.3 - 1.2 mg/dL   GFR, Estimated >60 >60 mL/min   Anion gap 8 5 - 15  ABO/Rh     Status: None   Collection Time: 09/06/20  8:45 PM  Result Value Ref Range   ABO/RH(D)      B POS Performed at [facility], [address], [place], [place] [address]   Glucose, capillary     Status: Abnormal   Collection Time: 09/06/20  9:09 PM  Result Value Ref Range   Glucose-Capillary 101 (H) 70 - 99 mg/dL    Assessment and Plan    A:   1. [redacted] weeks gestation of pregnancy   2. Vaginal bleeding during pregnancy       P:   Admit OB speciality BMZ ordered Recommend Valtrex- patient previously refused home treatment.  Yeast treatment Continuous Fetal monitoring NPO status.    Lezlie Lye, NP 09/06/2020 8:06 PM    Attestation of Attending Supervision of Advanced Practice Provider (PA/CNM/NP): Evaluation and management procedures were performed by the Advanced Practice Provider under my supervision and collaboration.  I have reviewed the Advanced Practice Provider's note and chart, and I agree with the management and plan. I have also made any necessary editorial changes.   Patient seen and evaluated. Vitals stable. Small amount of bloody discharge noted, closed cervix. Category 1  FHR tracing, uterine irritability noted on tocometer.  Explained to patient about need for admission given bleeding of unknown etiology, concern about abruption. Labs showed hemoglobin of 10.2, normal coagulation studies. Wet prep showed bacterial and yeast vaginitis.  There was concern about her vaginal tenderness on exam, no HSV lesions seen but patient had refused suppression therapy. Has A1GDM, glucose 101. Rh positive, she is typed and crossmatched for two units pRBCs. Other labs pending at this point.  Limited ultrasound showed no placental abruption/previa, normal anterior placenta, normal  AFI of 51.0, cephalic fetal presentation.  - Admit to OBSC  - Continuous fetal monitoring and tocometry for now - Diflucan ordered for yeast vaginitis and Metronidazole ordered for BV - Valtrex ordered for suppression given history of HSV - Betamethasone regimen ordered - Neonatology consulted, talked to Dr. Barbaraann Rondo - Will follow up [place], patient aware they will be elevated given betamethasone - Had a long discussion with patient and emphasized that if bleeding worsens, or any urgent maternal-fetal concerns, cesarean delivery could be indicated. She is very against this happening, and was not in the state of mind to sign a consent tonight for now. But emphasized that this could happen if needed. Will keep NPO for now, can reevaluate in the morning. - Continue close observation  - Routine antenatal care    Verita Schneiders, MD, [place] Attending [place], [place] for [facility], [place]

## 2020-09-06 NOTE — MAU Note (Signed)
Dr. Macon Large notified of difficult IV start. IV Team consult placed.  Laura Hernandez, on [facility] given an update on the patient status.

## 2020-09-06 NOTE — MAU Note (Signed)
Pt reports lower abd , lower back pain, spotting x 2 days.

## 2020-09-06 NOTE — MAU Note (Signed)
Attempt x 3 for iv start, iv team consult.

## 2020-09-07 ENCOUNTER — Inpatient Hospital Stay (HOSPITAL_BASED_OUTPATIENT_CLINIC_OR_DEPARTMENT_OTHER): Payer: Medicaid Other

## 2020-09-07 DIAGNOSIS — R102 Pelvic and perineal pain: Secondary | ICD-10-CM

## 2020-09-07 DIAGNOSIS — O26893 Other specified pregnancy related conditions, third trimester: Secondary | ICD-10-CM

## 2020-09-07 DIAGNOSIS — B009 Herpesviral infection, unspecified: Secondary | ICD-10-CM | POA: Diagnosis not present

## 2020-09-07 DIAGNOSIS — Z362 Encounter for other antenatal screening follow-up: Secondary | ICD-10-CM

## 2020-09-07 DIAGNOSIS — O24410 Gestational diabetes mellitus in pregnancy, diet controlled: Secondary | ICD-10-CM

## 2020-09-07 DIAGNOSIS — O24419 Gestational diabetes mellitus in pregnancy, unspecified control: Secondary | ICD-10-CM

## 2020-09-07 DIAGNOSIS — O98513 Other viral diseases complicating pregnancy, third trimester: Secondary | ICD-10-CM

## 2020-09-07 DIAGNOSIS — Z3A35 35 weeks gestation of pregnancy: Secondary | ICD-10-CM

## 2020-09-07 DIAGNOSIS — O4693 Antepartum hemorrhage, unspecified, third trimester: Secondary | ICD-10-CM | POA: Diagnosis not present

## 2020-09-07 DIAGNOSIS — D649 Anemia, unspecified: Secondary | ICD-10-CM

## 2020-09-07 DIAGNOSIS — O99213 Obesity complicating pregnancy, third trimester: Secondary | ICD-10-CM

## 2020-09-07 DIAGNOSIS — O99013 Anemia complicating pregnancy, third trimester: Secondary | ICD-10-CM

## 2020-09-07 LAB — GLUCOSE, CAPILLARY
Glucose-Capillary: 124 mg/dL — ABNORMAL HIGH (ref 70–99)
Glucose-Capillary: 105 mg/dL — ABNORMAL HIGH (ref 70–99)
Glucose-Capillary: 125 mg/dL — ABNORMAL HIGH (ref 70–99)
Glucose-Capillary: 144 mg/dL — ABNORMAL HIGH (ref 70–99)

## 2020-09-07 LAB — GC/CHLAMYDIA PROBE AMP (~~LOC~~) NOT AT ARMC
Chlamydia: NEGATIVE
Comment: NEGATIVE
Comment: NORMAL
Neisseria Gonorrhea: NEGATIVE

## 2020-09-07 LAB — RPR: RPR Ser Ql: NONREACTIVE

## 2020-09-07 MED ORDER — LACTATED RINGERS IV BOLUS
500.0000 mL | Freq: Once | INTRAVENOUS | Status: AC
  Administered 2020-09-07: 500 mL via INTRAVENOUS

## 2020-09-07 MED ORDER — DOCUSATE SODIUM 100 MG PO CAPS: 100.0000 mg | ORAL_CAPSULE | Freq: Two times a day (BID) | ORAL | Status: DC | PRN

## 2020-09-07 MED ORDER — LACTATED RINGERS IV SOLN: INTRAVENOUS | Status: DC

## 2020-09-07 NOTE — Progress Notes (Signed)
Daily Antepartum Note  Admission Date: 09/06/2020 Current Date: 09/07/2020 10:01 AM  Laura Hernandez is a 29 y.o. G2P0010 @ [redacted]w[redacted]d, HD#2, admitted for VB (pt first noticed on morning of 11/13)  Pregnancy complicated by: Patient Active Problem List   Diagnosis Date Noted  . Vaginal bleeding in pregnancy, third trimester 09/06/2020  . Vaginitis affecting pregnancy in third trimester, antepartum 09/06/2020  . [redacted] weeks gestation of pregnancy 09/06/2020  . BMI 40.0-44.9, adult (HCC) 08/25/2020  . Obesity in pregnancy 08/25/2020  . Pruritus 08/25/2020  . Gestational diabetes mellitus (GDM) in third trimester 08/04/2020  . Iron deficiency anemia 08/04/2020  . 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 Situational mixed anxiety and depressive disorder 07/01/2020  . Back pain affecting pregnancy, antepartum 07/01/2020  . Trichomonas infection 06/05/2020  . Muscle pain 06/05/2020  . Vaginal bleeding in pregnancy, second trimester 06/05/2020  . Urinary tract colonization by group B Streptococcus affecting pregnancy 03/31/2020  . Supervision of high risk pregnancy, antepartum 12/06/2013  . HSV-2 (herpes simplex virus 2) infection 09/18/2013    Overnight/24hr events:  none  Subjective:  Patient just back from u/s. No bleeding on pad or when she goes to the bathroom or wipes. Pt thinks she was having contractions during her NST  Objective:    Current Vital Signs 24h Vital Sign Ranges  T 98.2 F (36.8 C) Temp  Avg: 98.3 F (36.8 C)  Min: 98.2 F (36.8 C)  Max: 98.4 F (36.9 C)  BP (!) 111/57 BP  Min: 111/57  Max: 136/86  HR 73 Pulse  Avg: 78.5  Min: 73  Max: 81  RR 18 Resp  Avg: 17.7  Min: 17  Max: 18  SaO2 99 % Room Air SpO2  Avg: 99.7 %  Min: 99 %  Max: 100 %       24 Hour I/O Current Shift I/O  Time Ins Outs No intake/output data recorded. No intake/output data recorded.   Patient Vitals for the past 24 hrs:  BP Temp Temp src Pulse Resp SpO2  Height Weight  09/07/20 0916 (!) 111/57 98.2 F (36.8 C) Oral 73 18 99 % -- --  09/06/20 2120 119/68 -- -- 81 18 100 % -- --  09/06/20 2119 119/68 -- -- 81 -- -- -- --  09/06/20 1850 136/86 98.4 F (36.9 C) Oral 79 17 100 % 5' (1.524 m) 93.9 kg   FHT: 135 baseline, +accels, no decel, mod variability Toco: quiet  Physical exam: General: Well nourished, well developed female in no acute distress. Abdomen: gravid nttp Respiratory: No respiratory distress Extremities: no clubbing, cyanosis or edema Skin: Warm and dry.   Medications: Current Facility-Administered Medications  Medication Dose Route Frequency Provider Last Rate Last Admin  . 0.9 %  sodium chloride infusion (Manually program via Guardrails IV Fluids)   Intravenous Once Anyanwu, Ugonna A, MD      . acetaminophen (TYLENOL) tablet 650 mg  650 mg Oral Q4H PRN Anyanwu, Ugonna A, MD      . betamethasone acetate-betamethasone sodium phosphate (CELESTONE) injection 12 mg  12 mg Intramuscular Q24H Anyanwu, Ugonna A, MD   12 mg at 09/06/20 2037  . calcium carbonate (TUMS - dosed in mg elemental calcium) chewable tablet 400 mg of elemental calcium  2 tablet Oral Q4H PRN Anyanwu, Ugonna A, MD   400 mg of elemental calcium at 09/07/20 0506  . docusate sodium (COLACE) capsule 100 mg  100 mg Oral Daily Anyanwu, Jethro Bastos, MD      .  lactated ringers infusion   Intravenous Continuous Anyanwu, Jethro Bastos, MD 100 mL/hr at 09/07/20 0642 New Bag at 09/07/20 8299  . metroNIDAZOLE (FLAGYL) tablet 500 mg  500 mg Oral BID Anyanwu, Ugonna A, MD      . prenatal multivitamin tablet 1 tablet  1 tablet Oral Q1200 Anyanwu, Ugonna A, MD      . valACYclovir (VALTREX) tablet 500 mg  500 mg Oral BID Anyanwu, Ugonna A, MD      . zolpidem (AMBIEN) tablet 5 mg  5 mg Oral QHS PRN Anyanwu, Jethro Bastos, MD        Labs:  Recent Labs  Lab 09/06/20 2030  WBC 11.2*  HGB 10.2*  HCT 34.4*  PLT 248    Recent Labs  Lab 09/06/20 2030  NA 135  K 4.1  CL 106  CO2  21*  BUN <5*  CREATININE 0.64  CALCIUM 9.2  PROT 6.2*  BILITOT 0.4  ALKPHOS 134*  ALT 15  AST 16  GLUCOSE 120*     Radiology:  U/s pending  Assessment & Plan:  Pt improving *Pregnancy: reactive NST and no contractions on EFM. *VB in pregnancy: yesterday was last bleeding. F/u mfm u/s from today. Okay to come off continuous efm and do qshift NST and okay to eat. Pt told to wear a pad and let us know and save any VB *GDMa1: dm diet and qid CBGs *Preterm: bmz #2 @ 8pm today. S/p nicu consult *h/o HSV: pt declines valtrex ppx.  *PPx: scds, bedrest and bathroom privileges *FEN/GI: dm diet, sliv  *Dispo: earliest on Wednesday if she continues to have no bleeding.   Cornelia Copa MD Attending Center for [facility]ulty [facility]) GYN Consult Phone: [phone] (M-F, [phone]) & [phone] (Off hours, weekends, holidays)

## 2020-09-08 ENCOUNTER — Encounter: Payer: Medicaid Other | Admitting: Family Medicine

## 2020-09-08 ENCOUNTER — Ambulatory Visit: Payer: Medicaid Other | Attending: Obstetrics and Gynecology | Admitting: Physical Therapy

## 2020-09-08 DIAGNOSIS — O98513 Other viral diseases complicating pregnancy, third trimester: Secondary | ICD-10-CM | POA: Diagnosis not present

## 2020-09-08 DIAGNOSIS — O24410 Gestational diabetes mellitus in pregnancy, diet controlled: Secondary | ICD-10-CM | POA: Diagnosis not present

## 2020-09-08 DIAGNOSIS — B009 Herpesviral infection, unspecified: Secondary | ICD-10-CM | POA: Diagnosis not present

## 2020-09-08 DIAGNOSIS — O4693 Antepartum hemorrhage, unspecified, third trimester: Secondary | ICD-10-CM | POA: Diagnosis not present

## 2020-09-08 LAB — CULTURE, OB URINE

## 2020-09-08 LAB — GLUCOSE, CAPILLARY
Glucose-Capillary: 139 mg/dL — ABNORMAL HIGH (ref 70–99)
Glucose-Capillary: 136 mg/dL — ABNORMAL HIGH (ref 70–99)
Glucose-Capillary: 168 mg/dL — ABNORMAL HIGH (ref 70–99)
Glucose-Capillary: 175 mg/dL — ABNORMAL HIGH (ref 70–99)

## 2020-09-08 MED ORDER — METFORMIN HCL 500 MG PO TABS
500.0000 mg | ORAL_TABLET | Freq: Two times a day (BID) | ORAL | Status: DC
  Administered 2020-09-08 – 2020-09-09 (×2): 500 mg via ORAL
  Filled 2020-09-08 (×2): qty 1

## 2020-09-08 NOTE — Progress Notes (Signed)
Inpatient Diabetes Program Recommendations  Diabetes Treatment Program Recommendations  ADA Standards of Care 2018 Diabetes in Pregnancy Target Glucose Ranges:  Fasting: 60 - 90 mg/dL Preprandial: 60 - 885 mg/dL 1 hr postprandial: Less than 140mg /dL (from first bite of meal) 2 hr postprandial: Less than 120 mg/dL (from first bite of meal)    Lab Results  Component Value Date   GLUCAP 139 (H) 09/08/2020   HGBA1C 5.5 03/02/2020    Review of Glycemic Control Results for GRASIELA, JONSSON" (MRN Dulcy Fanny) as of 09/08/2020 09:03  Ref. Range 09/07/2020 13:11 09/07/2020 21:23 09/08/2020 06:41  Glucose-Capillary Latest Ref Range: 70 - 99 mg/dL 09/10/2020 (H) 867 (H) 672 (H)   Diabetes history: GDM Outpatient Diabetes medications: none Current orders for Inpatient glycemic control: none BMZ x2  Inpatient Diabetes Program Recommendations:    Glucose trends up slightly related to steroids. May want to consider adding Novolog 0-14 units TID.    Thanks, 094, MSN, RNC-OB Diabetes Coordinator [phone] (8a-5p)

## 2020-09-08 NOTE — Progress Notes (Signed)
PT requested to be removed from the IV fluid. RN educated her on the importance of the fluid in relation to her ctx and maintain hydration. PT refused the IV fluid and wanted to be removed while she was sleeping.

## 2020-09-08 NOTE — Progress Notes (Signed)
Daily Antepartum Note  Admission Date: 09/06/2020 Current Date: 09/08/2020 11:03 AM  Laura Hernandez is a 29 y.o. G2P0010 @ [redacted]w[redacted]d, HD#3, admitted for VB (pt first noticed on morning of 11/13)  Pregnancy complicated by: Patient Active Problem List   Diagnosis Date Noted  . Vaginal bleeding in pregnancy, third trimester 09/06/2020  . Vaginitis affecting pregnancy in third trimester, antepartum 09/06/2020  . [redacted] weeks gestation of pregnancy 09/06/2020  . BMI 40.0-44.9, adult (HCC) 08/25/2020  . Obesity in pregnancy 08/25/2020  . Pruritus 08/25/2020  . Gestational diabetes mellitus (GDM) in third trimester 08/04/2020  . Iron deficiency anemia 08/04/2020  . 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 Situational mixed anxiety and depressive disorder 07/01/2020  . Back pain affecting pregnancy, antepartum 07/01/2020  . Trichomonas infection 06/05/2020  . Muscle pain 06/05/2020  . Vaginal bleeding in pregnancy, second trimester 06/05/2020  . Urinary tract colonization by group B Streptococcus affecting pregnancy 03/31/2020  . Supervision of high risk pregnancy, antepartum 12/06/2013  . HSV-2 (herpes simplex virus 2) infection 09/18/2013    Overnight/24hr events:  None  Subjective:  No s/s of preterm labor.   Objective:    Current Vital Signs 24h Vital Sign Ranges  T 98.3 F (36.8 C) Temp  Avg: 98.2 F (36.8 C)  Min: 97.8 F (36.6 C)  Max: 98.5 F (36.9 C)  BP 124/63 BP  Min: 107/53  Max: 136/62  HR 76 Pulse  Avg: 83  Min: 76  Max: 91  RR 19 Resp  Avg: 18.8  Min: 18  Max: 20  SaO2 99 %  (Room Air) SpO2  Avg: 99.5 %  Min: 98 %  Max: 100 %       24 Hour I/O Current Shift I/O  Time Ins Outs 11/15 0701 - 11/16 0700 In: 239.6 [I.V.:239.6] Out: -  No intake/output data recorded.   Patient Vitals for the past 24 hrs:  BP Temp Temp src Pulse Resp SpO2  09/08/20 0755 124/63 98.3 F (36.8 C) Oral 76 19 99 %  09/08/20 0452 (!) 110/46 98.3 F (36.8  C) Oral 76 20 100 %  09/08/20 0006 119/61 -- -- 87 -- --  09/07/20 2330 (!) 112/47 98.5 F (36.9 C) Oral 80 20 98 %  09/07/20 1953 136/62 98.4 F (36.9 C) Oral 86 18 100 %  09/07/20 1555 124/62 97.8 F (36.6 C) Oral 91 18 100 %  09/07/20 1215 (!) 107/53 98.1 F (36.7 C) Oral 85 18 100 %   FHT: 150 baseline, +accels, no decel, mod variability Toco: quiet  Physical exam: General: Well nourished, well developed female in no acute distress. Abdomen: gravid nttp Respiratory: No respiratory distress Extremities: no clubbing, cyanosis or edema Skin: Warm and dry.   Medications: Current Facility-Administered Medications  Medication Dose Route Frequency Provider Last Rate Last Admin  . 0.9 %  sodium chloride infusion (Manually program via Guardrails IV Fluids)   Intravenous Once Anyanwu, Ugonna A, MD      . acetaminophen (TYLENOL) tablet 650 mg  650 mg Oral Q4H PRN Anyanwu, Jethro Bastos, MD   650 mg at 09/07/20 2003  . calcium carbonate (TUMS - dosed in mg elemental calcium) chewable tablet 400 mg of elemental calcium  2 tablet Oral Q4H PRN Anyanwu, Ugonna A, MD   400 mg of elemental calcium at 09/07/20 0506  . docusate sodium (COLACE) capsule 100 mg  100 mg Oral BID PRN [place] Bing, MD      .  lactated ringers infusion   Intravenous Continuous Hermina Staggers, MD   Stopped at 09/08/20 0020  . prenatal multivitamin tablet 1 tablet  1 tablet Oral Q1200 Anyanwu, Ugonna A, MD   1 tablet at 09/07/20 1257  . zolpidem (AMBIEN) tablet 5 mg  5 mg Oral QHS PRN Anyanwu, Jethro Bastos, MD        Labs:  Recent Labs  Lab 09/06/20 2030  WBC 11.2*  HGB 10.2*  HCT 34.4*  PLT 248    Recent Labs  Lab 09/06/20 2030  NA 135  K 4.1  CL 106  CO2 21*  BUN <5*  CREATININE 0.64  CALCIUM 9.2  PROT 6.2*  BILITOT 0.4  ALKPHOS 134*  ALT 15  AST 16  GLUCOSE 120*   Results for Laura Hernandez, Laura Hernandez" (MRN [id number]) as of 09/08/2020 11:22  Ref. Range 09/07/2020 09:13 09/07/2020 13:11 09/07/2020 21:23  09/08/2020 06:41 09/08/2020 10:20  Glucose-Capillary Latest Ref Range: 70 - 99 mg/dL 277 (H) 824 (H) 235 (H) 139 (H) 136 (H)   Radiology:  11/15: ceph, bpp 8/8, afi 15, efw 28% @ 2581gm, ac 73%, normal placenta  Assessment & Plan:  Pt improving *Pregnancy: reactive NST and no contractions on EFM. *VB in pregnancy: Last bleed was on 11/14. Pt had a few old blood spots yesterday. Continue with pad counts *GDMa1: dm diet and qid CBGs. Slightly elevated CBGs likely due to the steroids she got. I d/w her temporarily starting metformin which she is amenable to. Hopefully, will only need it for a few days *Preterm: bmz #2 @ 8pm today. S/p nicu consult *h/o HSV: pt declines valtrex ppx.  *PPx: scds, oob d lib *FEN/GI: dm diet, sliv  *Dispo: potentially tomorrow  [place] Bing, Montez Hageman MD Attending Center for [facility] (Faculty Practice) GYN Consult Phone: [phone] (M-F, [phone]) & [phone] (Off hours, weekends, holidays)

## 2020-09-09 ENCOUNTER — Other Ambulatory Visit: Payer: Self-pay | Admitting: *Deleted

## 2020-09-09 DIAGNOSIS — O24419 Gestational diabetes mellitus in pregnancy, unspecified control: Secondary | ICD-10-CM

## 2020-09-09 DIAGNOSIS — O24414 Gestational diabetes mellitus in pregnancy, insulin controlled: Secondary | ICD-10-CM

## 2020-09-09 DIAGNOSIS — B009 Herpesviral infection, unspecified: Secondary | ICD-10-CM | POA: Diagnosis not present

## 2020-09-09 DIAGNOSIS — O4693 Antepartum hemorrhage, unspecified, third trimester: Secondary | ICD-10-CM | POA: Diagnosis not present

## 2020-09-09 DIAGNOSIS — O26893 Other specified pregnancy related conditions, third trimester: Secondary | ICD-10-CM

## 2020-09-09 DIAGNOSIS — M5430 Sciatica, unspecified side: Secondary | ICD-10-CM | POA: Diagnosis present

## 2020-09-09 DIAGNOSIS — M5432 Sciatica, left side: Secondary | ICD-10-CM

## 2020-09-09 DIAGNOSIS — O24410 Gestational diabetes mellitus in pregnancy, diet controlled: Secondary | ICD-10-CM | POA: Diagnosis not present

## 2020-09-09 DIAGNOSIS — O4690 Antepartum hemorrhage, unspecified, unspecified trimester: Secondary | ICD-10-CM | POA: Diagnosis present

## 2020-09-09 DIAGNOSIS — O99820 Streptococcus B carrier state complicating pregnancy: Secondary | ICD-10-CM

## 2020-09-09 DIAGNOSIS — O98513 Other viral diseases complicating pregnancy, third trimester: Secondary | ICD-10-CM | POA: Diagnosis not present

## 2020-09-09 DIAGNOSIS — M5431 Sciatica, right side: Secondary | ICD-10-CM

## 2020-09-09 HISTORY — DX: Sciatica, unspecified side: M54.30

## 2020-09-09 LAB — GLUCOSE, CAPILLARY
Glucose-Capillary: 106 mg/dL — ABNORMAL HIGH (ref 70–99)
Glucose-Capillary: 89 mg/dL (ref 70–99)

## 2020-09-09 MED ORDER — METFORMIN HCL 1000 MG PO TABS
1000.0000 mg | ORAL_TABLET | Freq: Two times a day (BID) | ORAL | 1 refills | Status: DC
Start: 2020-09-09 — End: 2020-09-24

## 2020-09-09 MED ORDER — METFORMIN HCL 500 MG PO TABS: 1000.0000 mg | ORAL_TABLET | Freq: Two times a day (BID) | ORAL | Status: DC

## 2020-09-09 MED ORDER — CYCLOBENZAPRINE HCL 10 MG PO TABS: 10.0000 mg | ORAL_TABLET | Freq: Three times a day (TID) | ORAL | 0 refills | Status: DC | PRN

## 2020-09-09 NOTE — Progress Notes (Signed)
Daily Antepartum Note  Admission Date: 09/06/2020 Current Date: 09/09/2020 9:08 AM  Laura Hernandez is a 29 y.o. G2P0010 @ [redacted]w[redacted]d, HD#4, admitted for VB (pt first noticed on morning of 11/13)  Pregnancy complicated by: Patient Active Problem List   Diagnosis Date Noted  . Vaginal bleeding in pregnancy, third trimester 09/06/2020  . Vaginitis affecting pregnancy in third trimester, antepartum 09/06/2020  . [redacted] weeks gestation of pregnancy 09/06/2020  . BMI 40.0-44.9, adult (HCC) 08/25/2020  . Obesity in pregnancy 08/25/2020  . Pruritus 08/25/2020  . Gestational diabetes mellitus (GDM) in third trimester 08/04/2020  . Iron deficiency anemia 08/04/2020  . 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 Situational mixed anxiety and depressive disorder 07/01/2020  . Back pain affecting pregnancy, antepartum 07/01/2020  . Trichomonas infection 06/05/2020  . Muscle pain 06/05/2020  . Vaginal bleeding in pregnancy, second trimester 06/05/2020  . Urinary tract colonization by group B Streptococcus affecting pregnancy 03/31/2020  . Supervision of high risk pregnancy, antepartum 12/06/2013  . HSV-2 (herpes simplex virus 2) infection 09/18/2013    Overnight/24hr events:  None  Subjective:  No s/s of preterm labor. No VB or spotting o/n.   Continued b/l sciatic pain, stable. Able to walk and move around but difficult Objective:    Current Vital Signs 24h Vital Sign Ranges  T 98.2 F (36.8 C) Temp  Avg: 98.1 F (36.7 C)  Min: 97.5 F (36.4 C)  Max: 98.4 F (36.9 C)  BP (!) 120/42 BP  Min: 108/47  Max: 130/98  HR 66 Pulse  Avg: 78  Min: 66  Max: 94  RR 19 Resp  Avg: 18.7  Min: 18  Max: 20  SaO2 100 %  (Room AIr) SpO2  Avg: 100 %  Min: 100 %  Max: 100 %       24 Hour I/O Current Shift I/O  Time Ins Outs No intake/output data recorded. No intake/output data recorded.   Patient Vitals for the past 24 hrs:  BP Temp Temp src Pulse Resp SpO2  09/09/20 0746  (!) 120/42 98.2 F (36.8 C) Oral 66 19 100 %  09/09/20 0558 (!) 108/47 98.3 F (36.8 C) Oral 71 18 --  09/08/20 2337 (!) 110/48 98.4 F (36.9 C) Oral 73 18 100 %  09/08/20 1928 (!) 130/98 (!) 97.5 F (36.4 C) Oral 83 18 100 %  09/08/20 1611 (!) 119/51 98.2 F (36.8 C) Oral 94 19 100 %  09/08/20 1127 119/60 98 F (36.7 C) Oral 81 20 100 %   FHT: 145 baseline, +accels, no decel, mod variability Toco: quiet x 49m  Physical exam: General: Well nourished, well developed female in no acute distress. Abdomen: gravid nttp Respiratory: No respiratory distress Extremities: no clubbing, cyanosis or edema Skin: Warm and dry.   Medications: Current Facility-Administered Medications  Medication Dose Route Frequency Provider Last Rate Last Admin  . 0.9 %  sodium chloride infusion (Manually program via Guardrails IV Fluids)   Intravenous Once Anyanwu, Ugonna A, MD      . acetaminophen (TYLENOL) tablet 650 mg  650 mg Oral Q4H PRN Anyanwu, Jethro Bastos, MD   650 mg at 09/07/20 2003  . calcium carbonate (TUMS - dosed in mg elemental calcium) chewable tablet 400 mg of elemental calcium  2 tablet Oral Q4H PRN Anyanwu, Ugonna A, MD   400 mg of elemental calcium at 09/07/20 0506  . docusate sodium (COLACE) capsule 100 mg  100 mg Oral BID PRN [place] Bing,  MD      . lactated ringers infusion   Intravenous Continuous Hermina Staggers, MD   Stopped at 09/08/20 0020  . metFORMIN (GLUCOPHAGE) tablet 500 mg  500 mg Oral BID WC [place] Bing, MD   500 mg at 09/09/20 8144  . prenatal multivitamin tablet 1 tablet  1 tablet Oral Q1200 Anyanwu, Ugonna A, MD   1 tablet at 09/08/20 1152  . zolpidem (AMBIEN) tablet 5 mg  5 mg Oral QHS PRN Anyanwu, Jethro Bastos, MD        Labs:  Recent Labs  Lab 09/06/20 2030  WBC 11.2*  HGB 10.2*  HCT 34.4*  PLT 248    Recent Labs  Lab 09/06/20 2030  NA 135  K 4.1  CL 106  CO2 21*  BUN <5*  CREATININE 0.64  CALCIUM 9.2  PROT 6.2*  BILITOT 0.4  ALKPHOS 134*  ALT  15  AST 16  GLUCOSE 120*   Results for KIMLEY, APSEY" (MRN [id number]) as of 09/09/2020 09:10  Ref. Range 09/08/2020 06:41 09/08/2020 10:20 09/08/2020 16:12 09/08/2020 20:30 09/09/2020 06:00  Glucose-Capillary Latest Ref Range: 70 - 99 mg/dL 702 (H) 637 (H) 858 (H) 175 (H) 106 (H)   Radiology:  11/15: ceph, bpp 8/8, afi 15, efw 28% @ 2581gm, ac 73%, normal placenta  Assessment & Plan:  Pt improving *Pregnancy: reactive NST and no contractions on EFM. *VB in pregnancy: Last bleed was on 11/14. *GDMa1: dm diet and qid CBGs. Slightly elevated CBGs likely due to the steroids she got. I d/w her temporarily starting metformin which she is amenable to. Hopefully, will only need it for a few days but will increase to 1000 with br and dinner and have pt see dm education the next time she sees mfm for an u/s next week *PT: b/l sciatic pain. Has PT appt at [facility] later this month. Will have pt see today for any recs or needs for home equipment *Preterm: s/p bmz course. S/p nicu consult *h/o HSV: pt declines valtrex ppx.  *PPx: scds, oob d lib *FEN/GI: dm diet, sliv  *Dispo: today after PT eval today  Cornelia Copa MD Attending Center for [facility] ([facility]) GYN Consult Phone: [phone] (M-F, [phone]) & [phone] (Off hours, weekends, holidays)

## 2020-09-09 NOTE — Discharge Summary (Signed)
Physician Discharge Summary  Patient ID: Laura Hernandez MRN: [id number] DOB/AGE: 1991-02-22 29 y.o.  Admit date: 09/06/2020 Discharge date: 09/09/2020  Admission Diagnoses: Pregnancy at 35/5. Vaginal bleeding. GDMa1. H/o HSV2, GBS bacteruria.   Discharge Diagnoses: Same. Pregnancy at 36/1. GDMa2. Bilateral sciatic nerve pain  Discharged Condition: good  Hospital Course: Patient admitted for VB that started the day prior and was less but persisted on the day of presentation. Last bleed was on 11/14. She received BMZ steroid course on 11/14 and 11/15. She declined flagyl for BV and valtrex for HSV prophylaxis; GC/CT, UCx was negative. 11/15 u/s was normal with 28% efw 2581gm, AC 73%, bpp 8/8 with AFI 15, cephalic and normal placenta.  Patient was deemed stable for discharge home  PT consult ordered for b/l sciatic pain; pt can ambulate but has some pain with it. Patient had to leave in morning so consult cancelled. Pt has PT visit already has outpatient visit scheduled for next week  Patient started on metformin due to elevated CBGs. Hopefully only temporary but will reassess next week.   Consults: DM coordinator  Significant Diagnostic Studies: fetal monitoring; ultrasounds  Treatments: expectant management  Discharge Exam: Blood pressure (!) 120/42, pulse 66, temperature 98.2 F (36.8 C), temperature source Oral, resp. rate 19, height 5' (1.524 m), weight 93.9 kg, last menstrual period 12/31/2019, SpO2 100 % on RA FHT: 145 baseline, +accels, no decel, mod variability Toco: quiet x 67m Physical exam: General: Well nourished, well developed female in no acute distress. Abdomen: gravid nttp Respiratory: No respiratory distress Extremities: no clubbing, cyanosis or edema Skin: Warm and dry.   Disposition: Discharge disposition: 01-Home or Self Care       Discharge Instructions    Discharge patient   Complete by: As directed    Discharge disposition: 01-Home or Self Care    Discharge patient date: 09/09/2020     Allergies as of 09/09/2020      Reactions   Latex Itching, Rash      Medication List    TAKE these medications   Accu-Chek Guide test strip Generic drug: glucose blood Use to check blood sugars four times a day was instructed   Accu-Chek Guide w/Device Kit 1 Device by Does not apply route 4 (four) times daily.   Accu-Chek Softclix Lancets lancets 1 each by Other route 4 (four) times daily.   acetaminophen 500 MG tablet Commonly known as: TYLENOL Take 1,000 mg by mouth 2 (two) times daily as needed for pain.   albuterol 108 (90 Base) MCG/ACT inhaler Commonly known as: VENTOLIN HFA Inhale 1-2 puffs into the lungs every 6 (six) hours as needed for wheezing or shortness of breath.   cyclobenzaprine 10 MG tablet Commonly known as: FLEXERIL Take 1 tablet (10 mg total) by mouth 3 (three) times daily as needed for muscle spasms.   iron polysaccharides 150 MG capsule Commonly known as: NIFEREX Take 1 capsule (150 mg total) by mouth daily.   metFORMIN 1000 MG tablet Commonly known as: GLUCOPHAGE Take 1 tablet (1,000 mg total) by mouth 2 (two) times daily with a meal.   PRENATAL VITAMIN PO Take by mouth.   valACYclovir 1000 MG tablet Commonly known as: Valtrex Take 0.5 tablets (500 mg total) by mouth 2 (two) times daily.       [facility] Go in 2 day(s).   Specialty: Maternal and Fetal Medicine Contact information: [address] [place] [address] [phone]       Center for [facility] at [place]. Go in 5 day(s).   Specialty: Obstetrics and Gynecology Contact information: [place] [phone]              Signed: Aletha Halim 09/09/2020, 9:34 AM

## 2020-09-09 NOTE — Discharge Instructions (Signed)
Vaginal Bleeding During Pregnancy, Third Trimester  A small amount of bleeding (spotting) from the vagina is common during pregnancy. Sometimes the bleeding is normal and is not a problem, and sometimes it is a sign of something serious. Tell your doctor about any bleeding from your vagina right away. Follow these instructions at home: Activity  Follow your doctor's instructions about how active you can be. Your doctor may recommend that you: ? Stay in bed and only get up to use the bathroom. ? Continue light activity.  If needed, make plans for someone to help you with your normal activities.  Ask your doctor if it is safe for you to drive.  Do not lift anything that is heavier than 20 lb  until your doctor says that this is safe.  Do not have sex or orgasms until your doctor says that this is safe. Medicines  Take over-the-counter and prescription medicines only as told by your doctor.  Do not take aspirin. It can cause bleeding. General instructions  Watch your condition for any changes.  Write down: ? The number of pads you use each day. ? How often you change pads. ? How soaked (saturated) your pads are.  Do not use tampons.  Do not douche.  If you pass any tissue from your vagina, save the tissue to show your doctor.  Keep all follow-up visits as told by your doctor. This is important. Contact a doctor if:  You have vaginal bleeding at any time during pregnancy.  You have cramps.  You have a fever. Get help right away if:  You have very bad cramps.  You have very bad pain in your back or belly (abdomen).  You have a gush of fluid from your vagina.  You pass large clots or a lot of tissue from your vagina.  Your bleeding gets worse.  You feel light-headed or weak.  You pass out (faint).  Your baby is moving less than usual, or not moving at all. Summary  Tell your doctor about any bleeding from your vagina right away.  Follow instructions from  your doctor about how active you can be. You may need someone to help you with your normal activities. This information is not intended to replace advice given to you by your health care provider. Make sure you discuss any questions you have with your health care provider. Document Revised: 01/29/2019 Document Reviewed: 01/11/2017 Elsevier Patient Education  2020 [employer] Sciatica  Sciatica is pain, weakness, tingling, or loss of feeling (numbness) along the sciatic nerve. The sciatic nerve starts in the lower back and goes down the back of each leg. Sciatica usually goes away on its own or with treatment. Sometimes, sciatica may come back (recur). What are the causes? This condition happens when the sciatic nerve is pinched or has pressure put on it. This may be the result of:  A disk in between the bones of the spine bulging out too far (herniated disk).  Changes in the spinal disks that occur with aging.  A condition that affects a muscle in the butt.  Extra bone growth near the sciatic nerve.  A break (fracture) of the area between your hip bones (pelvis).  Pregnancy.  Tumor. This is rare. What increases the risk? You are more likely to develop this condition if you:  Play sports that put pressure or stress on the spine.  Have poor strength and ease of movement (flexibility).  Have had a back injury in the past.  Have had back surgery.  Sit for long periods of time.  Do activities that involve bending or lifting over and over again.  Are very overweight (obese). What are the signs or symptoms? Symptoms can vary from mild to very bad. They may include:  Any of these problems in the lower back, leg, hip, or butt: ? Mild tingling, loss of feeling, or dull aches. ? Burning sensations. ? Sharp pains.  Loss of feeling in the back of the calf or the sole of the foot.  Leg weakness.  Very bad back pain that makes it hard to move. These symptoms may get worse when  you cough, sneeze, or laugh. They may also get worse when you sit or stand for long periods of time. How is this treated? This condition often gets better without any treatment. However, treatment may include:  Changing or cutting back on physical activity when you have pain.  Doing exercises and stretching.  Putting ice or heat on the affected area.  Medicines that help: ? To relieve pain and swelling. ? To relax your muscles.  Shots (injections) of medicines that help to relieve pain, irritation, and swelling.  Surgery. Follow these instructions at home: Medicines  Take over-the-counter and prescription medicines only as told by your doctor.  Ask your doctor if the medicine prescribed to you: ? Requires you to avoid driving or using heavy machinery. ? Can cause trouble pooping (constipation). You may need to take these steps to prevent or treat trouble pooping:  Drink enough fluids to keep your pee (urine) pale yellow.  Take over-the-counter or prescription medicines.  Eat foods that are high in fiber. These include beans, whole grains, and fresh fruits and vegetables.  Limit foods that are high in fat and sugar. These include fried or sweet foods. Managing pain      If told, put ice on the affected area. ? Put ice in a plastic bag. ? Place a towel between your skin and the bag. ? Leave the ice on for 20 minutes, 2-3 times a day.  If told, put heat on the affected area. Use the heat source that your doctor tells you to use, such as a moist heat pack or a heating pad. ? Place a towel between your skin and the heat source. ? Leave the heat on for 20-30 minutes. ? Remove the heat if your skin turns bright red. This is very important if you are unable to feel pain, heat, or cold. You may have a greater risk of getting burned. Activity   Return to your normal activities as told by your doctor. Ask your doctor what activities are safe for you.  Avoid activities that  make your symptoms worse.  Take short rests during the day. ? When you rest for a long time, do some physical activity or stretching between periods of rest. ? Avoid sitting for a long time without moving. Get up and move around at least one time each hour.  Exercise and stretch regularly, as told by your doctor.  Do not lift anything that is heavier than 10 lb (4.5 kg) while you have symptoms of sciatica. ? Avoid lifting heavy things even when you do not have symptoms. ? Avoid lifting heavy things over and over.  When you lift objects, always lift in a way that is safe for your body. To do this, you should: ? Bend your knees. ? Keep the object close to your body. ? Avoid twisting. General instructions  Stay at a healthy weight.  Wear comfortable shoes that support your feet. Avoid wearing high heels.  Avoid sleeping on a mattress that is too soft or too hard. You might have less pain if you sleep on a mattress that is firm enough to support your back.  Keep all follow-up visits as told by your doctor. This is important. Contact a doctor if:  You have pain that: ? Wakes you up when you are sleeping. ? Gets worse when you lie down. ? Is worse than the pain you have had in the past. ? Lasts longer than 4 weeks.  You lose weight without trying. Get help right away if:  You cannot control when you pee (urinate) or poop (have a bowel movement).  You have weakness in any of these areas and it gets worse: ? Lower back. ? The area between your hip bones. ? Butt. ? Legs.  You have redness or swelling of your back.  You have a burning feeling when you pee. Summary  Sciatica is pain, weakness, tingling, or loss of feeling (numbness) along the sciatic nerve.  This condition happens when the sciatic nerve is pinched or has pressure put on it.  Sciatica can cause pain, tingling, or loss of feeling (numbness) in the lower back, legs, hips, and butt.  Treatment often includes  rest, exercise, medicines, and putting ice or heat on the affected area. This information is not intended to replace advice given to you by your health care provider. Make sure you discuss any questions you have with your health care provider. Document Revised: 10/29/2018 Document Reviewed: 10/29/2018 Elsevier Patient Education  2020 [employer].

## 2020-09-09 NOTE — Progress Notes (Signed)
Pt out with family teaching complete 

## 2020-09-10 LAB — TYPE AND SCREEN
ABO/RH(D): B POS
Antibody Screen: NEGATIVE
Unit division: 0
Unit division: 0

## 2020-09-10 LAB — BPAM RBC
Blood Product Expiration Date: 202112042359
Blood Product Expiration Date: 202112042359
ISSUE DATE / TIME: 202111111530
ISSUE DATE / TIME: 202111111530
Unit Type and Rh: 7300
Unit Type and Rh: 7300

## 2020-09-11 ENCOUNTER — Ambulatory Visit: Payer: Medicaid Other | Admitting: *Deleted

## 2020-09-11 ENCOUNTER — Encounter: Payer: Self-pay | Admitting: *Deleted

## 2020-09-11 ENCOUNTER — Other Ambulatory Visit: Payer: Self-pay

## 2020-09-11 ENCOUNTER — Ambulatory Visit: Payer: Medicaid Other | Attending: Obstetrics and Gynecology

## 2020-09-11 DIAGNOSIS — O23593 Infection of other part of genital tract in pregnancy, third trimester: Secondary | ICD-10-CM | POA: Diagnosis present

## 2020-09-11 DIAGNOSIS — Z3A35 35 weeks gestation of pregnancy: Secondary | ICD-10-CM

## 2020-09-11 DIAGNOSIS — Z3A36 36 weeks gestation of pregnancy: Secondary | ICD-10-CM

## 2020-09-11 DIAGNOSIS — O99013 Anemia complicating pregnancy, third trimester: Secondary | ICD-10-CM

## 2020-09-11 DIAGNOSIS — O98513 Other viral diseases complicating pregnancy, third trimester: Secondary | ICD-10-CM

## 2020-09-11 DIAGNOSIS — O26893 Other specified pregnancy related conditions, third trimester: Secondary | ICD-10-CM | POA: Diagnosis not present

## 2020-09-11 DIAGNOSIS — B009 Herpesviral infection, unspecified: Secondary | ICD-10-CM

## 2020-09-11 DIAGNOSIS — O4693 Antepartum hemorrhage, unspecified, third trimester: Secondary | ICD-10-CM | POA: Diagnosis not present

## 2020-09-11 DIAGNOSIS — O24410 Gestational diabetes mellitus in pregnancy, diet controlled: Secondary | ICD-10-CM

## 2020-09-11 DIAGNOSIS — Z362 Encounter for other antenatal screening follow-up: Secondary | ICD-10-CM

## 2020-09-11 DIAGNOSIS — O24419 Gestational diabetes mellitus in pregnancy, unspecified control: Secondary | ICD-10-CM | POA: Diagnosis present

## 2020-09-11 DIAGNOSIS — D649 Anemia, unspecified: Secondary | ICD-10-CM

## 2020-09-11 NOTE — Progress Notes (Signed)
States" I have been having regular contractions every 30 min x 2 days."

## 2020-09-14 ENCOUNTER — Ambulatory Visit (INDEPENDENT_AMBULATORY_CARE_PROVIDER_SITE_OTHER): Payer: Medicaid Other | Admitting: Obstetrics and Gynecology

## 2020-09-14 ENCOUNTER — Other Ambulatory Visit: Payer: Self-pay

## 2020-09-14 VITALS — BP 120/55 | HR 55 | Wt 209.0 lb

## 2020-09-14 DIAGNOSIS — O0990 Supervision of high risk pregnancy, unspecified, unspecified trimester: Secondary | ICD-10-CM

## 2020-09-14 DIAGNOSIS — O4693 Antepartum hemorrhage, unspecified, third trimester: Secondary | ICD-10-CM | POA: Diagnosis not present

## 2020-09-14 DIAGNOSIS — O0993 Supervision of high risk pregnancy, unspecified, third trimester: Secondary | ICD-10-CM | POA: Diagnosis not present

## 2020-09-14 DIAGNOSIS — O99820 Streptococcus B carrier state complicating pregnancy: Secondary | ICD-10-CM

## 2020-09-14 DIAGNOSIS — A599 Trichomoniasis, unspecified: Secondary | ICD-10-CM

## 2020-09-14 DIAGNOSIS — Z6841 Body Mass Index (BMI) 40.0 and over, adult: Secondary | ICD-10-CM

## 2020-09-14 DIAGNOSIS — D509 Iron deficiency anemia, unspecified: Secondary | ICD-10-CM

## 2020-09-14 DIAGNOSIS — B009 Herpesviral infection, unspecified: Secondary | ICD-10-CM

## 2020-09-14 DIAGNOSIS — O4692 Antepartum hemorrhage, unspecified, second trimester: Secondary | ICD-10-CM

## 2020-09-14 DIAGNOSIS — O361930 Maternal care for other isoimmunization, third trimester, not applicable or unspecified: Secondary | ICD-10-CM

## 2020-09-14 DIAGNOSIS — Z3A36 36 weeks gestation of pregnancy: Secondary | ICD-10-CM

## 2020-09-14 DIAGNOSIS — O24415 Gestational diabetes mellitus in pregnancy, controlled by oral hypoglycemic drugs: Secondary | ICD-10-CM

## 2020-09-14 MED ORDER — MICONAZOLE NITRATE 2 % VA CREA
1.0000 | TOPICAL_CREAM | Freq: Every day | VAGINAL | 0 refills | Status: DC
Start: 2020-09-14 — End: 2020-09-24

## 2020-09-14 NOTE — Progress Notes (Signed)
Prenatal Visit Note Date: 09/14/2020 Clinic: [facility]  Subjective:  Laura Hernandez is a 29 y.o. G2P0010 at [redacted]w[redacted]d being seen today for ongoing prenatal care.  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  Patient reports no complaints.   Contractions: Irregular. Vag. Bleeding: None.  Movement: Present. Denies leaking of fluid.   The following portions of the patient's history were reviewed and updated as appropriate: allergies, current medications, past family history, past medical history, past social history, past surgical history and problem list. Problem list updated.  Objective:   Vitals:   09/14/20 1307  BP: (!) 120/55  Pulse: (!) 55  Weight: 209 lb (94.8 kg)    Fetal Status: Fetal Heart Rate (bpm): 161 Fundal Height: 36 cm Movement: Present  Presentation: Vertex  General:  Alert, oriented and cooperative. Patient is in no acute distress.  Skin: Skin is warm and dry. No rash noted.   Cardiovascular: Normal heart rate noted  Respiratory: Normal respiratory effort, no problems with respiration noted  Abdomen: Soft, gravid, appropriate for gestational age. Pain/Pressure:  Present     Pelvic:  Cervical exam deferred        Extremities: Normal range of motion.  Edema: Trace  Mental Status: Normal mood and affect. Normal behavior. Normal judgment and thought content.   Urinalysis:      Assessment and Plan:  Pregnancy: G2P0010 at [redacted]w[redacted]d  1. Supervision of high risk pregnancy, antepartum Routine care. F/u with pt re: BC options on admissioin  2. HSV-2 (herpes simplex virus 2) infection Declined hsv ppx. No current s/s  3. Urinary tract colonization by group B Streptococcus affecting pregnancy tx in labor  4. Vaginal bleeding in pregnancy, second trimester None since hospital d/c.   5. Trichomonas infection Neg toc  6. Gestational diabetes mellitus (GDM) in third trimester controlled on oral hypoglycemic drug Pt on metformin 1000 with breakfast and dinner. She states with watching her diet, her AMs are in the 90s and 2h PP in the 110s. If not then, am fastings in the high 90s and in the 120s for 2h PP.  I told her to do post prandial walks and really try and watch her diet but given issues with CBG control, I told her I recommend 38wk delivery, which she is amenable to. Pt set up for 11/29 midnight IOL.   Follow up BPP for 11/24 with MFM  7. 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 Iron deficiency anemia, unspecified iron deficiency anemia type hgb 10.2 in hospital  9. BMI 40.0-44.9, adult (HCC)  10. [redacted] weeks gestation of pregnancy  11. Sciatic pain Likely to resolve with delivery. Sees PT later this week  Preterm labor symptoms and general obstetric precautions including but not limited to vaginal bleeding, contractions, leaking of fluid and fetal movement were reviewed in detail with the patient.  Please refer to After Visit Summary for other counseling recommendations.  Return in about 6 weeks (around 10/26/2020), or if symptoms worsen or fail to improve, for in person, 2hr  GTT.   [place] Bing, MD

## 2020-09-15 ENCOUNTER — Encounter (HOSPITAL_COMMUNITY): Payer: Self-pay | Admitting: *Deleted

## 2020-09-15 ENCOUNTER — Telehealth: Payer: Self-pay

## 2020-09-15 ENCOUNTER — Telehealth (HOSPITAL_COMMUNITY): Payer: Self-pay | Admitting: *Deleted

## 2020-09-15 ENCOUNTER — Ambulatory Visit: Payer: Medicaid Other | Admitting: Physical Therapy

## 2020-09-15 NOTE — Telephone Encounter (Signed)
Pt is concerned about taking Valtrex while [redacted] weeks pregnant. Informed pt it is safe to take valtrex while pregnant. Pt also asked if it is safe to take diflucan and informed pt is is safe to take as well. Pt verbalized understanding.

## 2020-09-15 NOTE — Telephone Encounter (Signed)
Preadmission screen  

## 2020-09-16 ENCOUNTER — Other Ambulatory Visit: Payer: Self-pay

## 2020-09-16 ENCOUNTER — Ambulatory Visit: Payer: Medicaid Other | Admitting: *Deleted

## 2020-09-16 ENCOUNTER — Encounter: Payer: Self-pay | Admitting: *Deleted

## 2020-09-16 ENCOUNTER — Ambulatory Visit: Payer: Medicaid Other | Attending: Obstetrics and Gynecology | Admitting: *Deleted

## 2020-09-16 DIAGNOSIS — Z3A35 35 weeks gestation of pregnancy: Secondary | ICD-10-CM

## 2020-09-16 DIAGNOSIS — O24419 Gestational diabetes mellitus in pregnancy, unspecified control: Secondary | ICD-10-CM | POA: Diagnosis present

## 2020-09-16 DIAGNOSIS — O23593 Infection of other part of genital tract in pregnancy, third trimester: Secondary | ICD-10-CM

## 2020-09-16 DIAGNOSIS — Z3A37 37 weeks gestation of pregnancy: Secondary | ICD-10-CM | POA: Diagnosis not present

## 2020-09-16 NOTE — Procedures (Signed)
Laura Hernandez 29-Apr-1991 [redacted]w[redacted]d  Fetus A Non-Stress Test Interpretation for 09/16/20  Indication: Diabetes  Fetal Heart Rate A Mode: External Baseline Rate (A): 150 bpm Variability: Moderate Accelerations: 15 x 15 Decelerations: None Multiple birth?: No  Uterine Activity Mode: Palpation, Toco Contraction Frequency (min): x2 Contraction Duration (sec): 70-120 Contraction Quality: Mild Resting Tone Palpated: Relaxed Resting Time: Adequate  Interpretation (Fetal Testing) Nonstress Test Interpretation: Reactive Comments: Reviewed tracing with Dr. Judeth Cornfield

## 2020-09-19 ENCOUNTER — Other Ambulatory Visit: Payer: Self-pay | Admitting: Advanced Practice Midwife

## 2020-09-19 ENCOUNTER — Other Ambulatory Visit (HOSPITAL_COMMUNITY)
Admission: RE | Admit: 2020-09-19 | Discharge: 2020-09-19 | Disposition: A | Discharge status: Home / Self Care | Payer: Medicaid Other | Source: Ambulatory Visit | Attending: Family Medicine | Admitting: Family Medicine

## 2020-09-19 DIAGNOSIS — Z20822 Contact with and (suspected) exposure to covid-19: Secondary | ICD-10-CM | POA: Insufficient documentation

## 2020-09-19 DIAGNOSIS — Z01812 Encounter for preprocedural laboratory examination: Secondary | ICD-10-CM | POA: Insufficient documentation

## 2020-09-20 ENCOUNTER — Encounter (HOSPITAL_COMMUNITY): Payer: Self-pay | Admitting: Family Medicine

## 2020-09-20 ENCOUNTER — Inpatient Hospital Stay (HOSPITAL_COMMUNITY)
Admission: AD | Admit: 2020-09-20 | Discharge: 2020-09-24 | DRG: 787 | Disposition: A | Discharge status: Home / Self Care | Payer: Medicaid Other | Attending: Obstetrics and Gynecology | Admitting: Obstetrics and Gynecology

## 2020-09-20 ENCOUNTER — Other Ambulatory Visit: Payer: Self-pay

## 2020-09-20 DIAGNOSIS — O99214 Obesity complicating childbirth: Secondary | ICD-10-CM | POA: Diagnosis present

## 2020-09-20 DIAGNOSIS — O99824 Streptococcus B carrier state complicating childbirth: Secondary | ICD-10-CM | POA: Diagnosis present

## 2020-09-20 DIAGNOSIS — O26893 Other specified pregnancy related conditions, third trimester: Secondary | ICD-10-CM | POA: Diagnosis present

## 2020-09-20 DIAGNOSIS — Z6841 Body Mass Index (BMI) 40.0 and over, adult: Secondary | ICD-10-CM

## 2020-09-20 DIAGNOSIS — O361930 Maternal care for other isoimmunization, third trimester, not applicable or unspecified: Secondary | ICD-10-CM | POA: Diagnosis present

## 2020-09-20 DIAGNOSIS — O24425 Gestational diabetes mellitus in childbirth, controlled by oral hypoglycemic drugs: Secondary | ICD-10-CM | POA: Diagnosis present

## 2020-09-20 DIAGNOSIS — B951 Streptococcus, group B, as the cause of diseases classified elsewhere: Secondary | ICD-10-CM | POA: Diagnosis present

## 2020-09-20 DIAGNOSIS — D62 Acute posthemorrhagic anemia: Secondary | ICD-10-CM | POA: Diagnosis not present

## 2020-09-20 DIAGNOSIS — Z20822 Contact with and (suspected) exposure to covid-19: Secondary | ICD-10-CM | POA: Diagnosis present

## 2020-09-20 DIAGNOSIS — O24419 Gestational diabetes mellitus in pregnancy, unspecified control: Secondary | ICD-10-CM | POA: Diagnosis present

## 2020-09-20 DIAGNOSIS — A6000 Herpesviral infection of urogenital system, unspecified: Secondary | ICD-10-CM | POA: Diagnosis present

## 2020-09-20 DIAGNOSIS — B009 Herpesviral infection, unspecified: Secondary | ICD-10-CM | POA: Diagnosis present

## 2020-09-20 DIAGNOSIS — O9081 Anemia of the puerperium: Secondary | ICD-10-CM | POA: Diagnosis not present

## 2020-09-20 DIAGNOSIS — O99820 Streptococcus B carrier state complicating pregnancy: Secondary | ICD-10-CM

## 2020-09-20 DIAGNOSIS — D509 Iron deficiency anemia, unspecified: Secondary | ICD-10-CM | POA: Diagnosis present

## 2020-09-20 DIAGNOSIS — O4202 Full-term premature rupture of membranes, onset of labor within 24 hours of rupture: Secondary | ICD-10-CM | POA: Diagnosis not present

## 2020-09-20 DIAGNOSIS — Z3A37 37 weeks gestation of pregnancy: Secondary | ICD-10-CM | POA: Diagnosis not present

## 2020-09-20 DIAGNOSIS — O9832 Other infections with a predominantly sexual mode of transmission complicating childbirth: Secondary | ICD-10-CM | POA: Diagnosis present

## 2020-09-20 DIAGNOSIS — O4292 Full-term premature rupture of membranes, unspecified as to length of time between rupture and onset of labor: Secondary | ICD-10-CM | POA: Diagnosis present

## 2020-09-20 DIAGNOSIS — F4323 Adjustment disorder with mixed anxiety and depressed mood: Secondary | ICD-10-CM | POA: Diagnosis present

## 2020-09-20 DIAGNOSIS — A599 Trichomoniasis, unspecified: Secondary | ICD-10-CM | POA: Diagnosis present

## 2020-09-20 DIAGNOSIS — O0990 Supervision of high risk pregnancy, unspecified, unspecified trimester: Secondary | ICD-10-CM

## 2020-09-20 LAB — CBC
HCT: 33.5 % — ABNORMAL LOW (ref 36.0–46.0)
Hemoglobin: 9.8 g/dL — ABNORMAL LOW (ref 12.0–15.0)
MCH: 20.7 pg — ABNORMAL LOW (ref 26.0–34.0)
MCHC: 29.3 g/dL — ABNORMAL LOW (ref 30.0–36.0)
MCV: 70.8 fL — ABNORMAL LOW (ref 80.0–100.0)
Platelets: 232 10*3/uL (ref 150–400)
RBC: 4.73 MIL/uL (ref 3.87–5.11)
RDW: 18.9 % — ABNORMAL HIGH (ref 11.5–15.5)
WBC: 12.1 10*3/uL — ABNORMAL HIGH (ref 4.0–10.5)
nRBC: 0 % (ref 0.0–0.2)

## 2020-09-20 LAB — TYPE AND SCREEN
ABO/RH(D): B POS
Antibody Screen: NEGATIVE

## 2020-09-20 LAB — POCT FERN TEST: POCT Fern Test: POSITIVE

## 2020-09-20 LAB — SARS CORONAVIRUS 2 (TAT 6-24 HRS): SARS Coronavirus 2: NEGATIVE

## 2020-09-20 MED ORDER — LACTATED RINGERS IV SOLN
INTRAVENOUS | Status: DC
  Administered 2020-09-20: 1000 mL via INTRAVENOUS

## 2020-09-20 MED ORDER — LIDOCAINE HCL (PF) 1 % IJ SOLN: 30.0000 mL | INTRAMUSCULAR | Status: DC | PRN

## 2020-09-20 MED ORDER — ACETAMINOPHEN 325 MG PO TABS: 650.0000 mg | ORAL_TABLET | ORAL | Status: DC | PRN

## 2020-09-20 MED ORDER — LACTATED RINGERS IV SOLN
500.0000 mL | INTRAVENOUS | Status: DC | PRN
  Administered 2020-09-21: 500 mL via INTRAVENOUS

## 2020-09-20 MED ORDER — SODIUM CHLORIDE 0.9 % IV SOLN
5.0000 10*6.[IU] | Freq: Once | INTRAVENOUS | Status: AC
  Administered 2020-09-21: 5 10*6.[IU] via INTRAVENOUS
  Filled 2020-09-20: qty 5

## 2020-09-20 MED ORDER — FENTANYL CITRATE (PF) 100 MCG/2ML IJ SOLN
50.0000 ug | INTRAMUSCULAR | Status: DC | PRN
  Administered 2020-09-21: 50 ug via INTRAVENOUS
  Filled 2020-09-20: qty 2

## 2020-09-20 MED ORDER — PENICILLIN G POT IN DEXTROSE 60000 UNIT/ML IV SOLN
3.0000 10*6.[IU] | INTRAVENOUS | Status: DC
  Administered 2020-09-21 (×3): 3 10*6.[IU] via INTRAVENOUS
  Filled 2020-09-20 (×3): qty 50

## 2020-09-20 MED ORDER — OXYTOCIN-SODIUM CHLORIDE 30-0.9 UT/500ML-% IV SOLN: 2.5000 [IU]/h | INTRAVENOUS | Status: DC

## 2020-09-20 MED ORDER — OXYTOCIN BOLUS FROM INFUSION: 333.0000 mL | Freq: Once | INTRAVENOUS | Status: DC

## 2020-09-20 MED ORDER — SOD CITRATE-CITRIC ACID 500-334 MG/5ML PO SOLN
30.0000 mL | ORAL | Status: DC | PRN
  Filled 2020-09-20: qty 15

## 2020-09-20 MED ORDER — ONDANSETRON HCL 4 MG/2ML IJ SOLN: 4.0000 mg | Freq: Four times a day (QID) | INTRAMUSCULAR | Status: DC | PRN

## 2020-09-20 NOTE — MAU Provider Note (Deleted)
Laura Hernandez is a 29 y.o. female G2P0010 with IUP at 71w5dby LMP presenting for SROM at 2045.  She reports positive fetal movement. She endorses some contractions, denies vaginal bleeding.   EFW was 5 lbs 11 oz; 28% on 11/15/   [facility]. She is Gestational Diabetic GDMA2 on metformin, history of HSV on suppression, and GBS positive[facility] discharged home on 11/17.   Pregnancy complications:  - Past Medical History: Past Medical History:  Diagnosis Date  . Bronchitis   . Chlamydia   . Gestational diabetes   . History of gonorrhea 09/18/2013   2012    . History of trichomonal vaginitis 09/18/2013   2012    . Migraines 2012  . Trichomonas     Past Surgical History: Past Surgical History:  Procedure Laterality Date  . NO PAST SURGERIES      Obstetrical History: OB History    Gravida  2   Para      Term      Preterm      AB  1   Living        SAB  1   TAB      Ectopic      Multiple      Live Births               Social History: Social History   Socioeconomic History  . Marital status: Single    Spouse name: Not on file  . Number of children: Not on file  . Years of education: Not on file  . Highest education level: Not on file  Occupational History  . Not on file  Tobacco Use  . Smoking status: Never Smoker  . Smokeless tobacco: Never Used  Vaping Use  . Vaping Use: Never used  Substance and Sexual Activity  . Alcohol use: No  . Drug use: No  . Sexual activity: Not Currently  Other Topics Concern  . Not on file  Social History Narrative  . Not on file   Social Determinants of Health   Financial Resource Strain:   . Difficulty of Paying Living Expenses: Not on file  Food Insecurity:   . Worried About RCharity fundraiserin the Last Year: Not on file  . Ran Out of Food in the Last Year: Not on file  Transportation  Needs:   . Lack of Transportation (Medical): Not on file  . Lack of Transportation (Non-Medical): Not on file  Physical Activity:   . Days of Exercise per Week: Not on file  . Minutes of Exercise per Session: Not on file  Stress:   . Feeling of Stress : Not on file  Social Connections:   . Frequency of Communication with Friends and Family: Not on file  . Frequency of Social Gatherings with Friends and Family: Not on file  . Attends Religious Services: Not on file  . Active Member of Clubs or Organizations: Not on file  . Attends CArchivistMeetings: Not on file  . Marital Status: Not on file    Family History: Family History  Problem Relation Age of Onset  . Asthma Mother   . Arthritis Mother   . Diabetes Mother   . Heart disease Mother   . Hypertension Mother   . Stroke Mother   . Kidney disease Mother   . Vision loss Mother   .  Diabetes Maternal Grandmother   . Hypertension Maternal Grandmother     Allergies: Allergies  Allergen Reactions  . Latex Itching and Rash    Medications Prior to Admission  Medication Sig Dispense Refill Last Dose  . acetaminophen (TYLENOL) 500 MG tablet Take 1,000 mg by mouth 2 (two) times daily as needed for pain.    Past Week at Unknown time  . albuterol (PROVENTIL HFA;VENTOLIN HFA) 108 (90 Base) MCG/ACT inhaler Inhale 1-2 puffs into the lungs every 6 (six) hours as needed for wheezing or shortness of breath. 1 Inhaler 0 Past Week at Unknown time  . cyclobenzaprine (FLEXERIL) 10 MG tablet Take 1 tablet (10 mg total) by mouth 3 (three) times daily as needed for muscle spasms. 30 tablet 0 Past Month at Unknown time  . metFORMIN (GLUCOPHAGE) 1000 MG tablet Take 1 tablet (1,000 mg total) by mouth 2 (two) times daily with a meal. 30 tablet 1 09/20/2020 at Unknown time  . Prenatal Vit-Fe Fumarate-FA (PRENATAL VITAMIN PO) Take by mouth.   Past Week at Unknown time  . valACYclovir (VALTREX) 1000 MG tablet Take 0.5 tablets (500 mg total) by  mouth 2 (two) times daily. 75 tablet 1 09/19/2020 at Unknown time  . Accu-Chek Softclix Lancets lancets 1 each by Other route 4 (four) times daily. 100 each 12   . Blood Glucose Monitoring Suppl (ACCU-CHEK GUIDE) w/Device KIT 1 Device by Does not apply route 4 (four) times daily. 1 kit 0   . glucose blood (ACCU-CHEK GUIDE) test strip Use to check blood sugars four times a day was instructed 50 each 12   . iron polysaccharides (NIFEREX) 150 MG capsule Take 1 capsule (150 mg total) by mouth daily. (Patient not taking: Reported on 09/16/2020) 30 capsule 1   . miconazole (MONISTAT 7) 2 % vaginal cream Place 1 Applicatorful vaginally at bedtime. Apply for seven nights (Patient not taking: Reported on 09/16/2020) 30 g 0     Review of Systems   Constitutional: Negative for fever and chills Eyes: Negative for visual disturbances Respiratory: Negative for shortness of breath, dyspnea Cardiovascular: Negative for chest pain or palpitations  Gastrointestinal: Negative for vomiting, diarrhea and constipation.  POSITIVE for abdominal pain (contractions) Genitourinary: Negative for dysuria and urgency Musculoskeletal: Negative for back pain, joint pain, myalgias  Neurological: Negative for dizziness and headaches  Blood pressure (!) 145/67, pulse (!) 123, temperature 98.7 F (37.1 C), resp. rate 18, last menstrual period 12/31/2019, unknown if currently breastfeeding. General appearance: alert, cooperative and no distress Lungs: normal respiratory effort Heart: regular rate and rhythm Abdomen: soft, non-tender; bowel sounds normal Extremities: Homans sign is negative, no sign of DVT DTR's 2+ Presentation: cephalic Fetal monitoring  Baseline: 150 bpm, mod var, present acel, no decels Uterine activity  q 5 min     Prenatal labs: ABO, Rh: --/--/B POS Performed at [facility] [address] [place], [place] [address]  [phone] 2045) Antibody: NEG (11/14 2030) Rubella: 5.61 (05/10  1117) RPR: NON REACTIVE (11/14 2030)  HBsAg: Negative (05/10 1117)  HIV: Non Reactive (05/10 1117)  GBS:    1 hr Glucola   Ref Range & Units 1 mo ago  Glucose, Fasting 65 - 94 mg/dL 99High   Glucose, GTT - 1 Hour 65 - 179 mg/dL 183High   Glucose, GTT - 2 Hour 65 - 154 mg/dL 211High   Glucose, GTT - 3 Hour 65 - 139 mg/dL 155High     Genetic screening  normal Anatomy US normal  [place] for growth  Prenatal Transfer Tool  Maternal Diabetes: Yes:  Diabetes Type:  Insulin/Medication controlled. She reports that she is taking her valtrex now.  Genetic Screening: Normal Maternal Ultrasounds/Referrals: Normal Fetal Ultrasounds or other Referrals:  None Maternal Substance Abuse:  No Significant Maternal Medications:  Meds include: Other:  Significant Maternal Lab Results: Group B Strep positive  No results found for this or any previous visit (from the past 24 hour(s)).  Assessment: AUNYA LEMLER is a 29 y.o. G2P0010 with an IUP at 89w5dpresenting for SROM at 2045. Plan to admit.   She is a GDMA2 on metformin, s/p BMZ. She has had episode of vaginal bleeding that required inpatient observation; no active bleeding at this time. She has taken her Valtrex yesterday and today, although initially did not want to take Valtrex.    Patient Vitals for the past 24 hrs:  BP Temp Pulse Resp SpO2  09/20/20 2200 -- -- -- -- 99 %  09/20/20 2141 (!) 145/67 98.7 F (37.1 C) (!) 123 18 --    Plan: #Labor: expectant management #Pain:  Per request #FWB Cat 1 #ID: GBS: positive -will start PCN. On Valtrex #MOF:  breast #MOC: unsure #Circ: yes    KStarr Lake11/28/2021, 10:20 PM     KMervyn SkeetersKooistra 09/20/2020, 10:20 PM

## 2020-09-20 NOTE — MAU Note (Signed)
Pt stated her water broke about 30 min ago. Clear fluid out. Having some ctx .

## 2020-09-20 NOTE — H&P (Signed)
Laura Hernandez is a 29 y.o. female G2P0010 with IUP at 98w5dby LMP presenting for SROM at 2045.  She reports positive fetal movement. She endorses some contractions, denies vaginal bleeding.   EFW was 5 lbs 11 oz; 28% on 11/15/   Prenatal History/Complications: [facility] She [facility] from [facility]. She is Gestational Diabetic GDMA2 on metformin, history of HSV on suppression, and GBS positive. [facility] discharged home on 11/17.   Pregnancy complications:  - Past Medical History:     Past Medical History:  Diagnosis Date  . Bronchitis   . Chlamydia   . Gestational diabetes   . History of gonorrhea 09/18/2013   2012    . History of trichomonal vaginitis 09/18/2013   2012    . Migraines 2012  . Trichomonas     Past Surgical History:      Past Surgical History:  Procedure Laterality Date  . NO PAST SURGERIES      Obstetrical History:         OB History    Gravida  2   Para      Term      Preterm      AB  1   Living        SAB  1   TAB      Ectopic      Multiple      Live Births               Social History: Social History        Socioeconomic History  . Marital status: Single    Spouse name: Not on file  . Number of children: Not on file  . Years of education: Not on file  . Highest education level: Not on file  Occupational History  . Not on file  Tobacco Use  . Smoking status: Never Smoker  . Smokeless tobacco: Never Used  Vaping Use  . Vaping Use: Never used  Substance and Sexual Activity  . Alcohol use: No  . Drug use: No  . Sexual activity: Not Currently  Other Topics Concern  . Not on file  Social History Narrative  . Not on file   Social Determinants of Health      Financial Resource Strain:   . Difficulty of Paying Living Expenses: Not on file  Food Insecurity:   . Worried About RCharity fundraiserin the Last Year: Not on file  . Ran Out  of Food in the Last Year: Not on file  Transportation Needs:   . Lack of Transportation (Medical): Not on file  . Lack of Transportation (Non-Medical): Not on file  Physical Activity:   . Days of Exercise per Week: Not on file  . Minutes of Exercise per Session: Not on file  Stress:   . Feeling of Stress : Not on file  Social Connections:   . Frequency of Communication with Friends and Family: Not on file  . Frequency of Social Gatherings with Friends and Family: Not on file  . Attends Religious Services: Not on file  . Active Member of Clubs or Organizations: Not on file  . Attends CArchivistMeetings: Not on file  . Marital Status: Not on file    Family History:      Family History  Problem Relation Age of Onset  . Asthma Mother   . Arthritis Mother   . Diabetes Mother   .  Heart disease Mother   . Hypertension Mother   . Stroke Mother   . Kidney disease Mother   . Vision loss Mother   . Diabetes Maternal Grandmother   . Hypertension Maternal Grandmother     Allergies:     Allergies  Allergen Reactions  . Latex Itching and Rash           Medications Prior to Admission  Medication Sig Dispense Refill Last Dose  . acetaminophen (TYLENOL) 500 MG tablet Take 1,000 mg by mouth 2 (two) times daily as needed for pain.    Past Week at Unknown time  . albuterol (PROVENTIL HFA;VENTOLIN HFA) 108 (90 Base) MCG/ACT inhaler Inhale 1-2 puffs into the lungs every 6 (six) hours as needed for wheezing or shortness of breath. 1 Inhaler 0 Past Week at Unknown time  . cyclobenzaprine (FLEXERIL) 10 MG tablet Take 1 tablet (10 mg total) by mouth 3 (three) times daily as needed for muscle spasms. 30 tablet 0 Past Month at Unknown time  . metFORMIN (GLUCOPHAGE) 1000 MG tablet Take 1 tablet (1,000 mg total) by mouth 2 (two) times daily with a meal. 30 tablet 1 09/20/2020 at Unknown time  . Prenatal Vit-Fe Fumarate-FA (PRENATAL VITAMIN PO) Take by mouth.   Past  Week at Unknown time  . valACYclovir (VALTREX) 1000 MG tablet Take 0.5 tablets (500 mg total) by mouth 2 (two) times daily. 75 tablet 1 09/19/2020 at Unknown time  . Accu-Chek Softclix Lancets lancets 1 each by Other route 4 (four) times daily. 100 each 12   . Blood Glucose Monitoring Suppl (ACCU-CHEK GUIDE) w/Device KIT 1 Device by Does not apply route 4 (four) times daily. 1 kit 0   . glucose blood (ACCU-CHEK GUIDE) test strip Use to check blood sugars four times a day was instructed 50 each 12   . iron polysaccharides (NIFEREX) 150 MG capsule Take 1 capsule (150 mg total) by mouth daily. (Patient not taking: Reported on 09/16/2020) 30 capsule 1   . miconazole (MONISTAT 7) 2 % vaginal cream Place 1 Applicatorful vaginally at bedtime. Apply for seven nights (Patient not taking: Reported on 09/16/2020) 30 g 0     Review of Systems   Constitutional: Negative for fever and chills Eyes: Negative for visual disturbances Respiratory: Negative for shortness of breath, dyspnea Cardiovascular: Negative for chest pain or palpitations  Gastrointestinal: Negative for vomiting, diarrhea and constipation.  POSITIVE for abdominal pain (contractions) Genitourinary: Negative for dysuria and urgency Musculoskeletal: Negative for back pain, joint pain, myalgias  Neurological: Negative for dizziness and headaches  Blood pressure (!) 145/67, pulse (!) 123, temperature 98.7 F (37.1 C), resp. rate 18, last menstrual period 12/31/2019, unknown if currently breastfeeding. General appearance: alert, cooperative and no distress Lungs: normal respiratory effort Heart: regular rate and rhythm Abdomen: soft, non-tender; bowel sounds normal Extremities: Homans sign is negative, no sign of DVT DTR's 2+ Presentation: cephalic Fetal monitoring  Baseline: 150 bpm, mod var, present acel, no decels Uterine activity  q 5 min   Prenatal labs: ABO, Rh: --/--/B POS Performed at [facility] [address], [place], [place] [address]  [phone] 2045) Antibody: NEG (11/14 2030) Rubella: 5.61 (05/10 1117) RPR: NON REACTIVE (11/14 2030)  HBsAg: Negative (05/10 1117)  HIV: Non Reactive (05/10 1117)  GBS:    1 hr Glucola   Ref Range & Units 1 mo ago  Glucose, Fasting 65 - 94 mg/dL 99High   Glucose, GTT - 1 Hour 65 - 179  mg/dL 183High   Glucose, GTT - 2 Hour 65 - 154 mg/dL 211High   Glucose, GTT - 3 Hour 65 - 139 mg/dL 155High     Genetic screening  normal Anatomy US normal [place] for growth  Prenatal Transfer Tool  Maternal Diabetes: Yes:  Diabetes Type:  Insulin/Medication controlled. She reports that she is taking her valtrex now.  Genetic Screening: Normal Maternal Ultrasounds/Referrals: Normal Fetal Ultrasounds or other Referrals:  None Maternal Substance Abuse:  No Significant Maternal Medications:  Meds include: Other:  Significant Maternal Lab Results: Group B Strep positive  No results found for this or any previous visit (from the past 24 hour(s)).  Assessment: Laura Hernandez is a 29 y.o. G2P0010 with an IUP at 46w5dpresenting for SROM at 2045. Plan to admit.   She is a GDMA2 on metformin, s/p BMZ. She has had episode of vaginal bleeding that required inpatient observation; no active bleeding at this time. She has taken her Valtrex yesterday and today, although initially did not want to take Valtrex.    Patient Vitals for the past 24 hrs:  BP Temp Pulse Resp SpO2  09/20/20 2200 -- -- -- -- 99 %  09/20/20 2141 (!) 145/67 98.7 F (37.1 C) (!) 123 18 --    Plan: #Labor: expectant management #Pain:  Per request #FWB Cat 1 #ID: GBS: positive -will start PCN. On Valtrex #MOF:  breast #MOC: unsure #Circ: yes    KStarr Lake11/28/2021, 10:20 PM

## 2020-09-21 ENCOUNTER — Inpatient Hospital Stay (HOSPITAL_COMMUNITY): Payer: Medicaid Other

## 2020-09-21 ENCOUNTER — Encounter (HOSPITAL_COMMUNITY)
Admission: AD | Disposition: A | Discharge status: Home / Self Care | Payer: Self-pay | Source: Home / Self Care | Attending: Obstetrics and Gynecology

## 2020-09-21 ENCOUNTER — Encounter (HOSPITAL_COMMUNITY): Payer: Self-pay | Admitting: Obstetrics and Gynecology

## 2020-09-21 ENCOUNTER — Other Ambulatory Visit (HOSPITAL_COMMUNITY): Payer: Medicaid Other

## 2020-09-21 ENCOUNTER — Inpatient Hospital Stay (HOSPITAL_COMMUNITY): Payer: Medicaid Other | Admitting: Anesthesiology

## 2020-09-21 ENCOUNTER — Inpatient Hospital Stay (HOSPITAL_COMMUNITY)
Admission: AD | Admit: 2020-09-21 | Payer: Medicaid Other | Source: Home / Self Care | Admitting: Obstetrics and Gynecology

## 2020-09-21 DIAGNOSIS — O4202 Full-term premature rupture of membranes, onset of labor within 24 hours of rupture: Secondary | ICD-10-CM

## 2020-09-21 DIAGNOSIS — Z3A37 37 weeks gestation of pregnancy: Secondary | ICD-10-CM

## 2020-09-21 DIAGNOSIS — O99824 Streptococcus B carrier state complicating childbirth: Secondary | ICD-10-CM

## 2020-09-21 DIAGNOSIS — B951 Streptococcus, group B, as the cause of diseases classified elsewhere: Secondary | ICD-10-CM

## 2020-09-21 DIAGNOSIS — O24425 Gestational diabetes mellitus in childbirth, controlled by oral hypoglycemic drugs: Secondary | ICD-10-CM

## 2020-09-21 LAB — GLUCOSE, CAPILLARY
Glucose-Capillary: 115 mg/dL — ABNORMAL HIGH (ref 70–99)
Glucose-Capillary: 118 mg/dL — ABNORMAL HIGH (ref 70–99)
Glucose-Capillary: 120 mg/dL — ABNORMAL HIGH (ref 70–99)
Glucose-Capillary: 123 mg/dL — ABNORMAL HIGH (ref 70–99)
Glucose-Capillary: 145 mg/dL — ABNORMAL HIGH (ref 70–99)

## 2020-09-21 LAB — RPR: RPR Ser Ql: NONREACTIVE

## 2020-09-21 SURGERY — Surgical Case
Anesthesia: Epidural

## 2020-09-21 MED ORDER — FENTANYL CITRATE (PF) 100 MCG/2ML IJ SOLN
INTRAMUSCULAR | Status: DC | PRN
  Administered 2020-09-21: 100 ug via EPIDURAL

## 2020-09-21 MED ORDER — OXYCODONE HCL 5 MG PO TABS
5.0000 mg | ORAL_TABLET | ORAL | Status: DC | PRN
  Administered 2020-09-22 – 2020-09-24 (×5): 5 mg via ORAL
  Filled 2020-09-21: qty 2
  Filled 2020-09-21 (×4): qty 1

## 2020-09-21 MED ORDER — ENOXAPARIN SODIUM 40 MG/0.4ML ~~LOC~~ SOLN
40.0000 mg | SUBCUTANEOUS | Status: DC
  Administered 2020-09-24: 40 mg via SUBCUTANEOUS
  Filled 2020-09-21 (×2): qty 0.4

## 2020-09-21 MED ORDER — KETOROLAC TROMETHAMINE 30 MG/ML IJ SOLN
INTRAMUSCULAR | Status: AC
  Filled 2020-09-21: qty 1

## 2020-09-21 MED ORDER — METOCLOPRAMIDE HCL 5 MG/ML IJ SOLN
INTRAMUSCULAR | Status: AC
  Filled 2020-09-21: qty 2

## 2020-09-21 MED ORDER — WITCH HAZEL-GLYCERIN EX PADS: 1.0000 "application " | MEDICATED_PAD | CUTANEOUS | Status: DC | PRN

## 2020-09-21 MED ORDER — MISOPROSTOL 50MCG HALF TABLET
50.0000 ug | ORAL_TABLET | ORAL | Status: DC | PRN
  Administered 2020-09-21: 50 ug via BUCCAL
  Filled 2020-09-21: qty 1

## 2020-09-21 MED ORDER — PROMETHAZINE HCL 25 MG/ML IJ SOLN
25.0000 mg | Freq: Four times a day (QID) | INTRAMUSCULAR | Status: DC | PRN
  Administered 2020-09-21: 25 mg via INTRAVENOUS
  Filled 2020-09-21: qty 1

## 2020-09-21 MED ORDER — SODIUM BICARBONATE 8.4 % IV SOLN
INTRAVENOUS | Status: DC | PRN
  Administered 2020-09-21: 5 mL via EPIDURAL

## 2020-09-21 MED ORDER — SODIUM CHLORIDE (PF) 0.9 % IJ SOLN
INTRAMUSCULAR | Status: DC | PRN
  Administered 2020-09-21: 12 mL/h via EPIDURAL

## 2020-09-21 MED ORDER — DIPHENHYDRAMINE HCL 50 MG/ML IJ SOLN: 12.5000 mg | INTRAMUSCULAR | Status: DC | PRN

## 2020-09-21 MED ORDER — TETANUS-DIPHTH-ACELL PERTUSSIS 5-2.5-18.5 LF-MCG/0.5 IM SUSY: 0.5000 mL | PREFILLED_SYRINGE | Freq: Once | INTRAMUSCULAR | Status: DC

## 2020-09-21 MED ORDER — IBUPROFEN 800 MG PO TABS
800.0000 mg | ORAL_TABLET | Freq: Three times a day (TID) | ORAL | Status: DC
  Administered 2020-09-21 – 2020-09-24 (×7): 800 mg via ORAL
  Filled 2020-09-21 (×8): qty 1

## 2020-09-21 MED ORDER — SCOPOLAMINE 1 MG/3DAYS TD PT72
1.0000 | MEDICATED_PATCH | Freq: Once | TRANSDERMAL | Status: AC
  Administered 2020-09-21: 1.5 mg via TRANSDERMAL

## 2020-09-21 MED ORDER — LACTATED RINGERS IV SOLN: INTRAVENOUS | Status: DC | PRN

## 2020-09-21 MED ORDER — ONDANSETRON HCL 4 MG/2ML IJ SOLN
INTRAMUSCULAR | Status: AC
  Filled 2020-09-21: qty 2

## 2020-09-21 MED ORDER — NALOXONE HCL 0.4 MG/ML IJ SOLN: 0.4000 mg | INTRAMUSCULAR | Status: DC | PRN

## 2020-09-21 MED ORDER — METOCLOPRAMIDE HCL 5 MG/ML IJ SOLN
INTRAMUSCULAR | Status: DC | PRN
  Administered 2020-09-21: 10 mg via INTRAVENOUS

## 2020-09-21 MED ORDER — ACETAMINOPHEN 500 MG PO TABS
1000.0000 mg | ORAL_TABLET | Freq: Four times a day (QID) | ORAL | Status: DC
  Administered 2020-09-21 – 2020-09-24 (×9): 1000 mg via ORAL
  Filled 2020-09-21 (×11): qty 2

## 2020-09-21 MED ORDER — EPHEDRINE 5 MG/ML INJ
10.0000 mg | INTRAVENOUS | Status: AC | PRN
  Administered 2020-09-21 (×3): 10 mg via INTRAVENOUS

## 2020-09-21 MED ORDER — SODIUM CHLORIDE 0.9 % IV SOLN
INTRAVENOUS | Status: AC
  Filled 2020-09-21: qty 500

## 2020-09-21 MED ORDER — LACTATED RINGERS IV SOLN
500.0000 mL | Freq: Once | INTRAVENOUS | Status: AC
  Administered 2020-09-21: 500 mL via INTRAVENOUS

## 2020-09-21 MED ORDER — SODIUM CHLORIDE 0.9 % IV SOLN
500.0000 mg | INTRAVENOUS | Status: AC
  Administered 2020-09-21: 500 mg via INTRAVENOUS

## 2020-09-21 MED ORDER — FENTANYL CITRATE (PF) 100 MCG/2ML IJ SOLN
100.0000 ug | INTRAMUSCULAR | Status: DC | PRN
  Administered 2020-09-21: 100 ug via INTRAVENOUS
  Filled 2020-09-21: qty 2

## 2020-09-21 MED ORDER — SENNOSIDES-DOCUSATE SODIUM 8.6-50 MG PO TABS
2.0000 | ORAL_TABLET | ORAL | Status: DC
  Administered 2020-09-21 – 2020-09-22 (×2): 2 via ORAL
  Filled 2020-09-21 (×2): qty 2

## 2020-09-21 MED ORDER — NALBUPHINE HCL 10 MG/ML IJ SOLN: 5.0000 mg | INTRAMUSCULAR | Status: DC | PRN

## 2020-09-21 MED ORDER — SOD CITRATE-CITRIC ACID 500-334 MG/5ML PO SOLN
30.0000 mL | ORAL | Status: AC
  Administered 2020-09-21: 30 mL via ORAL

## 2020-09-21 MED ORDER — DIBUCAINE (PERIANAL) 1 % EX OINT: 1.0000 "application " | TOPICAL_OINTMENT | CUTANEOUS | Status: DC | PRN

## 2020-09-21 MED ORDER — KETOROLAC TROMETHAMINE 30 MG/ML IJ SOLN: 30.0000 mg | Freq: Four times a day (QID) | INTRAMUSCULAR | Status: AC | PRN

## 2020-09-21 MED ORDER — DIPHENHYDRAMINE HCL 25 MG PO CAPS: 25.0000 mg | ORAL_CAPSULE | Freq: Four times a day (QID) | ORAL | Status: DC | PRN

## 2020-09-21 MED ORDER — MISOPROSTOL 25 MCG QUARTER TABLET
ORAL_TABLET | ORAL | Status: AC
  Administered 2020-09-21: 25 ug via VAGINAL
  Filled 2020-09-21: qty 1

## 2020-09-21 MED ORDER — PRENATAL MULTIVITAMIN CH
1.0000 | ORAL_TABLET | Freq: Every day | ORAL | Status: DC
  Filled 2020-09-21 (×2): qty 1

## 2020-09-21 MED ORDER — DEXAMETHASONE SODIUM PHOSPHATE 4 MG/ML IJ SOLN
INTRAMUSCULAR | Status: DC | PRN
  Administered 2020-09-21: 4 mg via INTRAVENOUS

## 2020-09-21 MED ORDER — NALBUPHINE HCL 10 MG/ML IJ SOLN
10.0000 mg | INTRAMUSCULAR | Status: DC | PRN
  Filled 2020-09-21: qty 1

## 2020-09-21 MED ORDER — NALBUPHINE HCL 10 MG/ML IJ SOLN: 5.0000 mg | Freq: Once | INTRAMUSCULAR | Status: DC | PRN

## 2020-09-21 MED ORDER — DIPHENHYDRAMINE HCL 25 MG PO CAPS
25.0000 mg | ORAL_CAPSULE | ORAL | Status: DC | PRN
  Administered 2020-09-21: 25 mg via ORAL
  Filled 2020-09-21: qty 1

## 2020-09-21 MED ORDER — OXYCODONE HCL 5 MG/5ML PO SOLN: 5.0000 mg | Freq: Once | ORAL | Status: DC | PRN

## 2020-09-21 MED ORDER — NALOXONE HCL 4 MG/10ML IJ SOLN
1.0000 ug/kg/h | INTRAVENOUS | Status: DC | PRN
  Filled 2020-09-21: qty 5

## 2020-09-21 MED ORDER — OXYCODONE HCL 5 MG PO TABS: 5.0000 mg | ORAL_TABLET | Freq: Once | ORAL | Status: DC | PRN

## 2020-09-21 MED ORDER — NALBUPHINE HCL 10 MG/ML IJ SOLN
10.0000 mg | INTRAMUSCULAR | Status: DC | PRN
  Administered 2020-09-21: 10 mg via INTRAVENOUS
  Filled 2020-09-21: qty 1

## 2020-09-21 MED ORDER — MISOPROSTOL 25 MCG QUARTER TABLET: 25.0000 ug | ORAL_TABLET | ORAL | Status: DC | PRN

## 2020-09-21 MED ORDER — LACTATED RINGERS AMNIOINFUSION: INTRAVENOUS | Status: DC

## 2020-09-21 MED ORDER — ONDANSETRON HCL 4 MG/2ML IJ SOLN: 4.0000 mg | Freq: Once | INTRAMUSCULAR | Status: DC | PRN

## 2020-09-21 MED ORDER — LIDOCAINE HCL (PF) 1 % IJ SOLN
INTRAMUSCULAR | Status: DC | PRN
  Administered 2020-09-21: 4 mL via EPIDURAL
  Administered 2020-09-21: 6 mL via EPIDURAL

## 2020-09-21 MED ORDER — MENTHOL 3 MG MT LOZG: 1.0000 | LOZENGE | OROMUCOSAL | Status: DC | PRN

## 2020-09-21 MED ORDER — PHENYLEPHRINE 40 MCG/ML (10ML) SYRINGE FOR IV PUSH (FOR BLOOD PRESSURE SUPPORT)
80.0000 ug | PREFILLED_SYRINGE | INTRAVENOUS | Status: DC | PRN
  Administered 2020-09-21 (×2): 80 ug via INTRAVENOUS

## 2020-09-21 MED ORDER — OXYTOCIN-SODIUM CHLORIDE 30-0.9 UT/500ML-% IV SOLN
INTRAVENOUS | Status: DC | PRN
  Administered 2020-09-21: 300 mL via INTRAVENOUS

## 2020-09-21 MED ORDER — MORPHINE SULFATE (PF) 10 MG/ML IV SOLN
INTRAVENOUS | Status: DC | PRN
  Administered 2020-09-21: 3 mg via EPIDURAL

## 2020-09-21 MED ORDER — FENTANYL-BUPIVACAINE-NACL 0.5-0.125-0.9 MG/250ML-% EP SOLN
12.0000 mL/h | EPIDURAL | Status: DC | PRN
  Filled 2020-09-21: qty 250

## 2020-09-21 MED ORDER — OXYTOCIN-SODIUM CHLORIDE 30-0.9 UT/500ML-% IV SOLN
INTRAVENOUS | Status: AC
  Filled 2020-09-21: qty 500

## 2020-09-21 MED ORDER — CEFAZOLIN SODIUM-DEXTROSE 2-4 GM/100ML-% IV SOLN
INTRAVENOUS | Status: AC
  Filled 2020-09-21: qty 100

## 2020-09-21 MED ORDER — DEXAMETHASONE SODIUM PHOSPHATE 4 MG/ML IJ SOLN
INTRAMUSCULAR | Status: AC
  Filled 2020-09-21: qty 1

## 2020-09-21 MED ORDER — SIMETHICONE 80 MG PO CHEW: 80.0000 mg | CHEWABLE_TABLET | ORAL | Status: DC | PRN

## 2020-09-21 MED ORDER — SODIUM CHLORIDE 0.9% FLUSH: 3.0000 mL | INTRAVENOUS | Status: DC | PRN

## 2020-09-21 MED ORDER — SIMETHICONE 80 MG PO CHEW
80.0000 mg | CHEWABLE_TABLET | Freq: Three times a day (TID) | ORAL | Status: DC
  Administered 2020-09-22 – 2020-09-24 (×4): 80 mg via ORAL
  Filled 2020-09-21 (×5): qty 1

## 2020-09-21 MED ORDER — TERBUTALINE SULFATE 1 MG/ML IJ SOLN
0.2500 mg | Freq: Once | INTRAMUSCULAR | Status: AC | PRN
  Administered 2020-09-21: 0.25 mg via SUBCUTANEOUS
  Filled 2020-09-21: qty 1

## 2020-09-21 MED ORDER — CEFAZOLIN SODIUM-DEXTROSE 2-4 GM/100ML-% IV SOLN: 2.0000 g | Freq: Once | INTRAVENOUS | Status: DC

## 2020-09-21 MED ORDER — SCOPOLAMINE 1 MG/3DAYS TD PT72
MEDICATED_PATCH | TRANSDERMAL | Status: AC
  Filled 2020-09-21: qty 1

## 2020-09-21 MED ORDER — MEPERIDINE HCL 25 MG/ML IJ SOLN: 6.2500 mg | INTRAMUSCULAR | Status: DC | PRN

## 2020-09-21 MED ORDER — MORPHINE SULFATE (PF) 0.5 MG/ML IJ SOLN
INTRAMUSCULAR | Status: AC
  Filled 2020-09-21: qty 10

## 2020-09-21 MED ORDER — CEFAZOLIN SODIUM-DEXTROSE 2-3 GM-%(50ML) IV SOLR
INTRAVENOUS | Status: DC | PRN
  Administered 2020-09-21: 2 g via INTRAVENOUS

## 2020-09-21 MED ORDER — ONDANSETRON HCL 4 MG/2ML IJ SOLN
INTRAMUSCULAR | Status: DC | PRN
  Administered 2020-09-21: 4 mg via INTRAVENOUS

## 2020-09-21 MED ORDER — OXYTOCIN-SODIUM CHLORIDE 30-0.9 UT/500ML-% IV SOLN: 2.5000 [IU]/h | INTRAVENOUS | Status: AC

## 2020-09-21 MED ORDER — COCONUT OIL OIL
1.0000 "application " | TOPICAL_OIL | Status: DC | PRN
  Administered 2020-09-22: 1 via TOPICAL

## 2020-09-21 MED ORDER — PHENYLEPHRINE 40 MCG/ML (10ML) SYRINGE FOR IV PUSH (FOR BLOOD PRESSURE SUPPORT)
80.0000 ug | PREFILLED_SYRINGE | INTRAVENOUS | Status: DC | PRN
  Filled 2020-09-21: qty 10

## 2020-09-21 MED ORDER — ONDANSETRON HCL 4 MG/2ML IJ SOLN: 4.0000 mg | Freq: Three times a day (TID) | INTRAMUSCULAR | Status: DC | PRN

## 2020-09-21 MED ORDER — FENTANYL CITRATE (PF) 100 MCG/2ML IJ SOLN
INTRAMUSCULAR | Status: AC
  Filled 2020-09-21: qty 2

## 2020-09-21 MED ORDER — KETOROLAC TROMETHAMINE 30 MG/ML IJ SOLN
30.0000 mg | Freq: Four times a day (QID) | INTRAMUSCULAR | Status: AC | PRN
  Administered 2020-09-21: 30 mg via INTRAVENOUS

## 2020-09-21 MED ORDER — FENTANYL CITRATE (PF) 100 MCG/2ML IJ SOLN: 25.0000 ug | INTRAMUSCULAR | Status: DC | PRN

## 2020-09-21 MED ORDER — SIMETHICONE 80 MG PO CHEW
80.0000 mg | CHEWABLE_TABLET | ORAL | Status: DC
  Administered 2020-09-21 – 2020-09-22 (×2): 80 mg via ORAL
  Filled 2020-09-21 (×2): qty 1

## 2020-09-21 MED ORDER — EPHEDRINE 5 MG/ML INJ
10.0000 mg | INTRAVENOUS | Status: DC | PRN
  Filled 2020-09-21: qty 10

## 2020-09-21 SURGICAL SUPPLY — 38 items
BENZOIN TINCTURE PRP APPL 2/3 (GAUZE/BANDAGES/DRESSINGS) ×3 IMPLANT
CANISTER SUCT 3000ML PPV (MISCELLANEOUS) ×3 IMPLANT
CHLORAPREP W/TINT 26ML (MISCELLANEOUS) ×3 IMPLANT
CLOSURE STERI STRIP 1/2 X4 (GAUZE/BANDAGES/DRESSINGS) ×3 IMPLANT
COVER LIGHT HANDLE  1/PK (MISCELLANEOUS) ×3
COVER LIGHT HANDLE 1/PK (MISCELLANEOUS) ×1 IMPLANT
DRSG OPSITE POSTOP 4X10 (GAUZE/BANDAGES/DRESSINGS) ×3 IMPLANT
ELECT REM PT RETURN 9FT ADLT (ELECTROSURGICAL) ×3
ELECTRODE REM PT RTRN 9FT ADLT (ELECTROSURGICAL) ×1 IMPLANT
EXTRACTOR VACUUM KIWI (MISCELLANEOUS) ×3 IMPLANT
GLOVE BIOGEL PI IND STRL 7.0 (GLOVE) ×2 IMPLANT
GLOVE BIOGEL PI IND STRL 7.5 (GLOVE) ×1 IMPLANT
GLOVE BIOGEL PI INDICATOR 7.0 (GLOVE) ×4
GLOVE BIOGEL PI INDICATOR 7.5 (GLOVE) ×2
GLOVE SKINSENSE NS SZ7.0 (GLOVE) ×2
GLOVE SKINSENSE STRL SZ7.0 (GLOVE) ×1 IMPLANT
GOWN STRL REUS W/ TWL LRG LVL3 (GOWN DISPOSABLE) ×2 IMPLANT
GOWN STRL REUS W/ TWL XL LVL3 (GOWN DISPOSABLE) ×1 IMPLANT
GOWN STRL REUS W/TWL LRG LVL3 (GOWN DISPOSABLE) ×6
GOWN STRL REUS W/TWL XL LVL3 (GOWN DISPOSABLE) ×3
NS IRRIG 1000ML POUR BTL (IV SOLUTION) ×3 IMPLANT
PACK C SECTION WH (CUSTOM PROCEDURE TRAY) ×3 IMPLANT
PAD ABD 7.5X8 STRL (GAUZE/BANDAGES/DRESSINGS) ×3 IMPLANT
PAD ABD DERMACEA PRESS 5X9 (GAUZE/BANDAGES/DRESSINGS) ×3 IMPLANT
PAD OB MATERNITY 4.3X12.25 (PERSONAL CARE ITEMS) ×3 IMPLANT
PAD PREP 24X48 CUFFED NSTRL (MISCELLANEOUS) ×3 IMPLANT
PENCIL SMOKE EVAC W/HOLSTER (ELECTROSURGICAL) ×3 IMPLANT
STRIP CLOSURE SKIN 1/2X4 (GAUZE/BANDAGES/DRESSINGS) ×2 IMPLANT
SUT MNCRL 0 VIOLET CTX 36 (SUTURE) ×2 IMPLANT
SUT MON AB 4-0 PS1 27 (SUTURE) ×3 IMPLANT
SUT MONOCRYL 0 CTX 36 (SUTURE) ×6
SUT PLAIN 2 0 XLH (SUTURE) ×3 IMPLANT
SUT VIC AB 0 CT1 36 (SUTURE) ×6 IMPLANT
SUT VIC AB 3-0 CT1 27 (SUTURE) ×3
SUT VIC AB 3-0 CT1 TAPERPNT 27 (SUTURE) ×1 IMPLANT
SUT VIC AB 4-0 KS 27 (SUTURE) ×3 IMPLANT
TOWEL OR 17X24 6PK STRL BLUE (TOWEL DISPOSABLE) ×6 IMPLANT
WATER STERILE IRR 1000ML POUR (IV SOLUTION) ×3 IMPLANT

## 2020-09-21 NOTE — Progress Notes (Signed)
LABOR PROGRESS NOTE  Laura Hernandez is a 29 y.o. G2P0010 at [redacted]w[redacted]d  admitted for PROM @2045  on 11/28.  Subjective: Pt reports increasing discomfort with contractions. Now considering epidural.  Objective: BP 134/73 (BP Location: Right Arm)   Pulse 92   Temp 98.1 F (36.7 C) (Oral)   Resp 17   Ht 5' (1.524 m)   Wt 95.3 kg   LMP 12/31/2019   SpO2 100%   BMI 41.01 kg/m  or  Vitals:   09/21/20 0615 09/21/20 0648 09/21/20 0700 09/21/20 0728  BP: 135/76 132/80 134/73   Pulse: 90 92 92   Resp:   17   Temp:   98.5 F (36.9 C) 98.1 F (36.7 C)  TempSrc:   Oral Oral  SpO2:      Weight:      Height:        Dilation: 2.5 Effacement (%): Thick Cervical Position: Posterior Station: -2 Presentation: Vertex Exam by:: Dr. [email or web address]  FHT: baseline rate 150, moderate varibility, +accel, no decels Toco: ctx q3-35min  Labs: Lab Results  Component Value Date   WBC 12.1 (H) 09/20/2020   HGB 9.8 (L) 09/20/2020   HCT 33.5 (L) 09/20/2020   MCV 70.8 (L) 09/20/2020   PLT 232 09/20/2020    Patient Active Problem List   Diagnosis Date Noted  . GDM, class A2 09/20/2020  . Positive GBS test 09/20/2020  . Sciatic leg pain 09/09/2020  . Vaginal bleeding in pregnancy 09/09/2020  . Vaginal bleeding in pregnancy, third trimester 09/06/2020  . Vaginitis affecting pregnancy in third trimester, antepartum 09/06/2020  . [redacted] weeks gestation of pregnancy 09/06/2020  . BMI 40.0-44.9, adult (HCC) 08/25/2020  . Obesity in pregnancy 08/25/2020  . Pruritus 08/25/2020  . Gestational diabetes mellitus (GDM) in third trimester 08/04/2020  . Iron deficiency anemia 08/04/2020  . 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 Situational mixed anxiety and depressive disorder 07/01/2020  . Back pain affecting pregnancy, antepartum 07/01/2020  . Trichomonas infection 06/05/2020  . Muscle pain 06/05/2020  . Vaginal bleeding in pregnancy, second trimester 06/05/2020  . Urinary  tract colonization by group B Streptococcus affecting pregnancy 03/31/2020  . Supervision of high risk pregnancy, antepartum 12/06/2013  . HSV-2 (herpes simplex virus 2) infection 09/18/2013    Assessment / Plan: 29 y.o. G2P0010 at [redacted]w[redacted]d here for PROM.  Labor: Pt now s/p cytotec x2 (most recent dose at 0925). Discussed FB placement but pt declines at this time. Will plan to recheck cervical exam in 4 hours or sooner as clinically indicated. Fetal Wellbeing:  Cat 1 strip Pain Control:   Fentanyl q1hr prn; pt now plans for epidural Anticipated MOD:  SVD A2GDM: BG 145 >123 > 120. Plan to continue checking BG every 4 hours in latent labor, then every 2 hours in active labor. GBS+: PCN started on admission HSV: on valtrex. No visible lesions on admission.   [redacted]w[redacted]d, MD 09/21/2020, 9:24 AM

## 2020-09-21 NOTE — Discharge Summary (Signed)
Postpartum Discharge Summary  Date of Service updated 09/22/20    Patient Name: Laura Hernandez DOB: 1991/08/21 MRN: [id number]  Date of admission: 09/20/2020 Delivery date:09/21/2020  Delivering provider: Aletha Halim  Date of discharge: 09/23/2020  Admitting diagnosis: GDM, class A2 [O24.419] Intrauterine pregnancy: [redacted]w[redacted]d    Secondary diagnosis:  Principal Problem:   Cesarean delivery delivered Active Problems:   HSV-2 (herpes simplex virus 2) infection   Supervision of high risk pregnancy, antepartum   Urinary tract colonization by group B Streptococcus affecting pregnancy   Trichomonas infection   Situational mixed anxiety and depressive disorder   Gestational diabetes mellitus (GDM) in third trimester   Iron deficiency anemia   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 yo G2P1011 at 386w6das admitted to the hospital 09/20/2020 for latent labor s/p PROM at 2045 on 09/20/20. Patient had a labor course significant for non-reassuring fetal heart tones remote from delivery. The patient went for cesarean section due to Non-Reassuring FHR. Delivery details are as follows: Membrane Rupture Time/Date: 8:45 PM ,09/20/2020   Delivery Method:C-Section, Low Transverse  Details of operation can be found in separate operative Note.  Patient had an uncomplicated postpartum course. She is ambulating, tolerating a regular diet, passing flatus, and urinating well.  Patient is discharged home in stable condition on 09/23/20.      Newborn Data: Birth date:09/21/2020  Birth  time:4:36 PM  Gender:Female  Living status:Living  Apgars:8 ,8  Weight:3025 g                                 Magnesium Sulfate received: No BMZ received: Yes Rhophylac:N/A MMR:N/A T-DaP:Given prenatally  Flu: No Transfusion:No  Physical exam  Vitals:   09/22/20 1254 09/22/20 1513 09/22/20 2000 09/23/20 0534  BP: (!) 109/56  132/82 114/61  Pulse: 92  84 79  Resp: _0 Temp: 100 F (37.8 C) 98.1 F (36.7 C) 98.3 F (36.8 C) 98.2 F (36.8 C)  TempSrc: Oral Oral Oral Oral  SpO2: 100%  100% 100%  Weight:      Height:       General: alert, cooperative and no distress Lochia: appropriate Uterine Fundus: firm Incision: Dressing is clean, dry, and intact DVT Evaluation: No evidence of DVT seen on physical exam. Labs: Lab Results  Component Value Date   WBC 13.5 (H) 09/22/2020   HGB 7.6 (L) 09/22/2020   HCT 25.4 (L) 09/22/2020   MCV 70.6 (L) 09/22/2020   PLT 196 09/22/2020   CMP Latest Ref Rng & Units 09/06/2020  Glucose 70 - 99 mg/dL 120(H)  BUN 6 - 20 mg/dL <5(L)  Creatinine 0.44 - 1.00 mg/dL 0.64  Sodium 135 - 145 mmol/L 135  Potassium 3.5 - 5.1 mmol/L 4.1  Chloride 98 - 111 mmol/L 106  CO2 22 - 32 mmol/L 21(L)  Calcium 8.9 - 10.3 mg/dL 9.2  Total Protein 6.5 - 8.1 g/dL 6.2(L)  Total Bilirubin 0.3 - 1.2 mg/dL 0.4  Alkaline Phos 38 - 126 U/L 134(H)  AST 15 - 41 U/L 16  ALT 0 - 44 U/L 15   Edinburgh Score: Edinburgh Postnatal Depression Scale Screening Tool 09/22/2020  I have been able to laugh and see the funny side of things. (No Data)     After visit meds:  Allergies as of 09/23/2020      Reactions   Latex Itching, Rash      Medication List    STOP taking these medications   Accu-Chek Guide test strip Generic drug: glucose blood   Accu-Chek Guide w/Device Kit   Accu-Chek Softclix Lancets lancets   cyclobenzaprine 10 MG tablet Commonly known as: FLEXERIL   iron polysaccharides 150 MG capsule Commonly known as: NIFEREX   metFORMIN  1000 MG tablet Commonly known as: GLUCOPHAGE   miconazole 2 % vaginal cream Commonly known as: MONISTAT 7   valACYclovir 1000 MG tablet Commonly known as: Valtrex     TAKE these medications   acetaminophen 500 MG tablet Commonly known as: TYLENOL Take 1,000 mg by mouth 2 (two) times daily as needed for pain.   albuterol 108 (90 Base) MCG/ACT inhaler Commonly known as: VENTOLIN HFA Inhale 1-2 puffs into the lungs every 6 (six) hours as needed for wheezing or shortness of breath.   coconut oil Oil Apply 1 application topically as needed.   ferrous sulfate 325 (65 FE) MG tablet Take 1 tablet (325 mg total) by mouth every other day.   ibuprofen 800 MG tablet Commonly known as: ADVIL Take 1 tablet (800 mg total) by mouth every 8 (eight) hours.   norethindrone 0.35 MG tablet Commonly known as: Ortho Micronor Take 1 tablet (0.35 mg total) by mouth daily.   oxyCODONE 5 MG immediate release tablet Commonly known as: Oxy IR/ROXICODONE Take 1-2 tablets (5-10 mg total) by mouth every 4 (four) hours as needed for moderate pain.   PRENATAL VITAMIN PO Take by mouth.   vitamin C 250 MG tablet Commonly known as: ASCORBIC ACID Take 1 tablet (250 mg total) by mouth every other day. Take with iron.        Discharge home in stable condition Infant Feeding: Breast Infant Disposition:home with mother Discharge instruction: per After Visit Summary and Postpartum booklet. Activity: Advance as tolerated. Pelvic rest for 6 weeks.  Diet: routine diet Future Appointments: Future Appointments  Date Time Provider [place]  09/30/2020  1:15 PM [facility] NURSE [facility]  10/29/2020  8:15 AM Aletha Halim, MD [facility]   Follow up Visit: Message sent to [facility] on 09/21/20.  Please schedule this patient for a In person postpartum visit in 4 weeks with the following provider: Any provider. Additional Postpartum F/U:2 hour GTT and Incision check  1 week, postpartum mood check 1 week High risk pregnancy complicated by: [id number] (metformin), anxiety/depression, Trichomonas s/p negative TOC, GBS positive urine culture Delivery mode:  C-Section, Low Transverse  Anticipated Birth Control:  POPs, rx sent to pharmacy  83/05/1839 Arrie Senate, MD

## 2020-09-21 NOTE — Anesthesia Procedure Notes (Signed)
Epidural Patient location during procedure: OB Start time: 09/21/2020 12:29 PM End time: 09/21/2020 12:44 PM  Staffing Anesthesiologist: Lucretia Kern, MD Performed: anesthesiologist   Preanesthetic Checklist Completed: patient identified, IV checked, risks and benefits discussed, monitors and equipment checked, pre-op evaluation and timeout performed  Epidural Patient position: sitting Prep: DuraPrep Patient monitoring: heart rate, continuous pulse ox and blood pressure Approach: midline Location: L3-L4 Injection technique: LOR air  Needle:  Needle type: Tuohy  Needle gauge: 17 G Needle length: 9 cm Needle insertion depth: 9 cm Catheter type: closed end flexible Catheter size: 19 Gauge Catheter at skin depth: 14 cm Test dose: negative  Assessment Events: blood not aspirated, injection not painful, no injection resistance, no paresthesia and negative IV test  Additional Notes Reason for block:procedure for pain

## 2020-09-21 NOTE — Progress Notes (Signed)
Inpatient Diabetes Program Recommendations  Diabetes Treatment Program Recommendations  ADA Standards of Care Diabetes in Pregnancy Target Glucose Ranges:  Fasting: 60 - 90 mg/dL Preprandial: 60 - 983 mg/dL 1 hr postprandial: Less than 140mg /dL (from first bite of meal) 2 hr postprandial: Less than 120 mg/dL (from first bite of meal)   Results for JENNFER, GASSEN" (MRN Dulcy Fanny) as of 09/21/2020 08:45  Ref. Range 09/21/2020 00:46 09/21/2020 04:47 09/21/2020 06:53  Glucose-Capillary Latest Ref Range: 70 - 99 mg/dL 09/23/2020 (H) 673 (H) 419 (H)    Review of Glycemic Control  Diabetes history: GDM Outpatient Diabetes medications: Metformin 1000 mg BID Current orders for Inpatient glycemic control: None; CBG monitoring  Inpatient Diabetes Program Recommendations:    Insulin: May want to consider ordering Novolog 0-14 units Q4H.  Thanks, 379, RN, MSN, CDE Diabetes Coordinator Inpatient Diabetes Program [phone] (Team Pager from 8am to 5pm)

## 2020-09-21 NOTE — Op Note (Addendum)
Operative Note   SURGERY DATE: 09/21/2020  PRE-OP DIAGNOSIS:  Pregnancy at [redacted]w[redacted]d s/p PROM at 2045 on 09/20/20 Non-reassuring fetal heart tones, remote from delivery  POST-OP DIAGNOSIS: same  PROCEDURE: primary low transverse cesarean section via pfannenstiel skin incision with double layer uterine closure  SURGEON: Surgeon(s) and Role:    * [place] Bing, MD - Primary    * Sheila Oats, MD - Fellow  ANESTHESIA: epidural  ESTIMATED BLOOD LOSS:  463 ml  DRAINS: 300 mL UOP via indwelling foley  TOTAL IV FLUIDS: 2300 mL crystalloid  VTE PROPHYLAXIS: SCDs to bilateral lower extremities  ANTIBIOTICS: Two grams of ancef 2g & azithromycin 500mg  were given., within 1 hour of skin incision  SPECIMENS: none  COMPLICATIONS: none  INDICATIONS: non-reassuringly fetal heart tones, remote from  delivery  FINDINGS: Filmy intra-abdominal adhesions along bilateral adnexa were noted. Grossly normal uterus, tubes and ovaries. Clear amniotic fluid, cephalic female infant, weight 3025gm APGARs 8/8, intact placenta.  PROCEDURE IN DETAIL: The patient was taken to the operating room where anesthesia was administered and normal fetal heart tones were confirmed. She was then prepped and draped in the normal fashion in the dorsal supine position with a leftward tilt.  After a time out was performed, a pfannensteil skin incision was made with the scalpel. Bovie was then used to carry through to the underlying layer of fascia. The fascia was then incised at the midline with use of scalpel and this incision was extended laterally with the mayo scissors. Attention was turned to the superior aspect of the fascial incision which was grasped with the kocher clamps x 2, tented up and the rectus muscles were dissected off with the Bovie. In a similar fashion the inferior aspect of the fascial incision was grasped with the kocher clamps, tented up and the rectus muscles dissected off with the mayo scissors. The  rectus muscles were then separated in the midline and the peritoneum was entered bluntly. The Alexis retractor was inserted in addition to the bladder blade. The vesicouterine peritoneum was identified, tented up and entered with the metzenbaum scissors. This incision was extended laterally and the bladder flap was created digitally. The bladder blade was reinserted.  A low transverse hysterotomy was made with the scalpel until the endometrial cavity was breached and the amniotic sac ruptured bluntly, yielding clear amniotic fluid. This incision was extended bluntly and the infant's head, shoulders and body were delivered atraumatically.The cord was clamped x 2 and cut, and the infant was handed to the awaiting pediatricians, after delayed cord clamping was done.  The placenta was then gradually expressed from the uterus and then the uterus was exteriorized and cleared of all clots and debris. The hysterotomy was repaired with a running suture of 0 monocryl. A second imbricating layer of 0 monocryl suture was then placed. Excellent hemostasis was achieved. The uterus and adnexa were then returned to the abdomen, and the hysterotomy and all operative sites were reinspected and excellent hemostasis was noted after irrigation and suction of the abdomen with warm saline. The peritoneum was closed with a running stitch of 3-0 Vicryl. The fascia was reapproximated with 0 Vicryl in a simple running fashion bilaterally. The subcutaneous layer was then reapproximated with interrupted sutures of 2-0 plain gut, and the skin was then closed with 4-0 monocryl, in a subcuticular fashion.  The patient  tolerated the procedure well. Sponge, lap, needle, and instrument counts were correct x 2. The patient was transferred to the recovery room awake,  alert and breathing independently in stable condition.  Sheila Oats, MD OB Fellow, Faculty Practice 09/21/2020 5:31 PM  Agree with above. I was present and scrubbed for the  entire procedure.   Cornelia Copa MD Attending Center for [employer] [facility])

## 2020-09-21 NOTE — Anesthesia Preprocedure Evaluation (Addendum)
Anesthesia Evaluation  Patient identified by MRN, date of birth, ID band Patient awake    Reviewed: Allergy & Precautions, H&P , NPO status , Patient's Chart, lab work & pertinent test results  History of Anesthesia Complications Negative for: history of anesthetic complications  Airway Mallampati: II  TM Distance: >3 FB Neck ROM: full    Dental no notable dental hx.    Pulmonary neg pulmonary ROS,    Pulmonary exam normal        Cardiovascular negative cardio ROS Normal cardiovascular exam Rhythm:regular Rate:Normal     Neuro/Psych negative neurological ROS  negative psych ROS   GI/Hepatic negative GI ROS, Neg liver ROS,   Endo/Other  diabetes, GestationalMorbid obesity  Renal/GU      Musculoskeletal   Abdominal   Peds  Hematology  (+) Blood dyscrasia, anemia ,   Anesthesia Other Findings   Reproductive/Obstetrics (+) Pregnancy                             Anesthesia Physical Anesthesia Plan  ASA: III and emergent  Anesthesia Plan: Epidural   Post-op Pain Management:    Induction:   PONV Risk Score and Plan:   Airway Management Planned:   Additional Equipment:   Intra-op Plan:   Post-operative Plan:   Informed Consent: I have reviewed the patients History and Physical, chart, labs and discussed the procedure including the risks, benefits and alternatives for the proposed anesthesia with the patient or authorized representative who has indicated his/her understanding and acceptance.       Plan Discussed with:   Anesthesia Plan Comments: ( To OR for fetal intolerance to labor. Pt with labor epidural in place, excellent pain control. Plan for epidural anesthesia for C/S.)       Anesthesia Quick Evaluation

## 2020-09-21 NOTE — Transfer of Care (Signed)
Immediate Anesthesia Transfer of Care Note  Patient: Laura Hernandez  Procedure(s) Performed: CESAREAN SECTION (N/A )  Patient Location: PACU  Anesthesia Type:Epidural  Level of Consciousness: awake, alert  and oriented  Airway & Oxygen Therapy: Patient Spontanous Breathing  Post-op Assessment: Report given to RN and Post -op Vital signs reviewed and stable  Post vital signs: Reviewed and stable  Last Vitals:  Vitals Value Taken Time  BP 113/64 09/21/20 1733  Temp    Pulse 97 09/21/20 1736  Resp 23 09/21/20 1736  SpO2 100 % 09/21/20 1736  Vitals shown include unvalidated device data.  Last Pain:  Vitals:   09/21/20 1449  TempSrc: Axillary  PainSc:       Patients Stated Pain Goal: 0 (09/21/20 0320)  Complications: No complications documented.

## 2020-09-21 NOTE — Progress Notes (Signed)
LABOR PROGRESS NOTE  Laura Hernandez is a 29 y.o. G2P0010 at [redacted]w[redacted]d  admitted for PROM @2045  on 11/28.  Subjective: Patient walking around room, breathing through contractions. Patient does not want any one to check her cervix at this time. Discussed with patient that it is time for next medication as last cytotec was given around 0030. Patient agrees to having cervical examination.   Objective: BP 123/66   Pulse 89   Temp 97.8 F (36.6 C) (Oral)   Resp 20   Ht 5' (1.524 m)   Wt 95.3 kg   LMP 12/31/2019   SpO2 99%   BMI 41.01 kg/m  or  Vitals:   09/20/20 2340 09/21/20 0027 09/21/20 0300 09/21/20 0400  BP: 131/83 126/80 110/67 123/66  Pulse: 96 92 94 89  Resp: 18 17 20 20   Temp: 98.3 F (36.8 C) 98.7 F (37.1 C) 97.8 F (36.6 C)   TempSrc: Oral Oral Oral   SpO2:      Weight: 95.3 kg     Height: 5' (1.524 m)       Dilation: 2 Effacement (%): 70 Cervical Position: Posterior Station: -3 Presentation: Vertex Exam by:: K.Vernon FHT: baseline rate 160, moderate varibility, +accel, variable decel Toco: 2 minutes   Labs: Lab Results  Component Value Date   WBC 12.1 (H) 09/20/2020   HGB 9.8 (L) 09/20/2020   HCT 33.5 (L) 09/20/2020   MCV 70.8 (L) 09/20/2020   PLT 232 09/20/2020    Patient Active Problem List   Diagnosis Date Noted  . GDM, class A2 09/20/2020  . Positive GBS test 09/20/2020  . Sciatic leg pain 09/09/2020  . Vaginal bleeding in pregnancy 09/09/2020  . Vaginal bleeding in pregnancy, third trimester 09/06/2020  . Vaginitis affecting pregnancy in third trimester, antepartum 09/06/2020  . [redacted] weeks gestation of pregnancy 09/06/2020  . BMI 40.0-44.9, adult (HCC) 08/25/2020  . Obesity in pregnancy 08/25/2020  . Pruritus 08/25/2020  . Gestational diabetes mellitus (GDM) in third trimester 08/04/2020  . Iron deficiency anemia 08/04/2020  . 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 Situational mixed anxiety and depressive  disorder 07/01/2020  . Back pain affecting pregnancy, antepartum 07/01/2020  . Trichomonas infection 06/05/2020  . Muscle pain 06/05/2020  . Vaginal bleeding in pregnancy, second trimester 06/05/2020  . Urinary tract colonization by group B Streptococcus affecting pregnancy 03/31/2020  . Supervision of high risk pregnancy, antepartum 12/06/2013  . HSV-2 (herpes simplex virus 2) infection 09/18/2013    Assessment / Plan: 29 y.o. G2P0010 at [redacted]w[redacted]d here for PROM   Labor: Will hold on next cytotec for now as patient is contracting frequently and breathing through contractions. Once contractions space then will give buccal cytotec.  Fetal Wellbeing:  Cat II  Pain Control:   Requesting IV pain medication  Anticipated MOD:  SVD  26, CNM 09/21/2020, 5:48 AM

## 2020-09-21 NOTE — Progress Notes (Signed)
LABOR PROGRESS NOTE  Laura Hernandez is a 29 y.o. G2P0010 at [redacted]w[redacted]d  admitted for PROM @2045  on 11/28.  Subjective: Pt reports increasing discomfort with contractions. Now considering epidural.  Objective: BP (!) 123/52   Pulse 82   Temp 97.9 F (36.6 C) (Axillary)   Resp 18   Ht 5' (1.524 m)   Wt 95.3 kg   LMP 12/31/2019   SpO2 100%   BMI 41.01 kg/m  or  Vitals:   09/21/20 1355 09/21/20 1400 09/21/20 1430 09/21/20 1449  BP: (!) 134/51 (!) 128/58 (!) 123/52   Pulse: 84 (!) 102 82   Resp:      Temp:    97.9 F (36.6 C)  TempSrc:    Axillary  SpO2:      Weight:      Height:        Dilation: 5 Effacement (%): 60 Cervical Position: Posterior Station: -2 Presentation: Vertex Exam by:: Dr. [email or web address] FHT: baseline rate 175, moderate varibility, no accel, recurrent late decels Toco: ctx q3-47min  Labs: Lab Results  Component Value Date   WBC 12.1 (H) 09/20/2020   HGB 9.8 (L) 09/20/2020   HCT 33.5 (L) 09/20/2020   MCV 70.8 (L) 09/20/2020   PLT 232 09/20/2020    Patient Active Problem List   Diagnosis Date Noted  . GDM, class A2 09/20/2020  . Positive GBS test 09/20/2020  . Sciatic leg pain 09/09/2020  . Vaginal bleeding in pregnancy 09/09/2020  . Vaginal bleeding in pregnancy, third trimester 09/06/2020  . Vaginitis affecting pregnancy in third trimester, antepartum 09/06/2020  . [redacted] weeks gestation of pregnancy 09/06/2020  . BMI 40.0-44.9, adult (HCC) 08/25/2020  . Obesity in pregnancy 08/25/2020  . Pruritus 08/25/2020  . Gestational diabetes mellitus (GDM) in third trimester 08/04/2020  . Iron deficiency anemia 08/04/2020  . 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 Situational mixed anxiety and depressive disorder 07/01/2020  . Back pain affecting pregnancy, antepartum 07/01/2020  . Trichomonas infection 06/05/2020  . Muscle pain 06/05/2020  . Vaginal bleeding in pregnancy, second trimester 06/05/2020  . Urinary tract  colonization by group B Streptococcus affecting pregnancy 03/31/2020  . Supervision of high risk pregnancy, antepartum 12/06/2013  . HSV-2 (herpes simplex virus 2) infection 09/18/2013    Assessment / Plan: 29 y.o. G2P0010 at [redacted]w[redacted]d here for PROM.  Labor: Pt now s/p cytotec x2 (most recent dose at 0925). Now s/p multiple doses of phenylephrine and terb given recurrent prolonged decels at 1241 s/p epidural. Pt continues to have Category 2 strip given recurrent late decels despite amnioinfusion, maternal repositioning and multiple fluid boluses. Given minimal cervical change with persistent Category 2 strip and inability to start pitocin, recommended Cesarean delivery at this time.  The risks of cesarean section were discussed with the patient including but were not limited to: bleeding which may require transfusion or reoperation; infection which may require antibiotics; injury to bowel, bladder, ureters or other surrounding organs; injury to the fetus; need for additional procedures including hysterectomy in the event of a life-threatening hemorrhage; placental abnormalities wth subsequent pregnancies, incisional problems, thromboembolic phenomenon and other postoperative/anesthesia complications.  The patient concurred with the proposed plan, giving informed written consent for the procedure.  Patient has been NPO since last night; she will remain NPO for procedure. Anesthesia and OR aware.  Preoperative prophylactic antibiotics (azithromycin, ancef) and SCDs ordered on call to the OR.  To OR when ready.  Fetal Wellbeing:  Persistent Cat 2 strip given  recurrent late decels Pain Control:   Epidural in place Anticipated MOD:  SVD A2GDM: BG 145 >123 > 120 > 118 >115. Plan for FBG on POD#1. GBS+: PCN started on admission HSV: on valtrex. No visible lesions on admission.  Sheila Oats, MD 09/21/2020, 3:25 PM

## 2020-09-22 ENCOUNTER — Inpatient Hospital Stay (HOSPITAL_COMMUNITY): Payer: Medicaid Other

## 2020-09-22 ENCOUNTER — Encounter (HOSPITAL_COMMUNITY): Payer: Self-pay | Admitting: Obstetrics and Gynecology

## 2020-09-22 ENCOUNTER — Other Ambulatory Visit: Payer: Medicaid Other

## 2020-09-22 ENCOUNTER — Inpatient Hospital Stay (HOSPITAL_COMMUNITY): Admission: AD | Admit: 2020-09-22 | Payer: Medicaid Other | Source: Home / Self Care | Admitting: Family Medicine

## 2020-09-22 ENCOUNTER — Encounter (HOSPITAL_COMMUNITY): Payer: Self-pay

## 2020-09-22 LAB — CBC
HCT: 25.4 % — ABNORMAL LOW (ref 36.0–46.0)
Hemoglobin: 7.6 g/dL — ABNORMAL LOW (ref 12.0–15.0)
MCH: 21.1 pg — ABNORMAL LOW (ref 26.0–34.0)
MCHC: 29.9 g/dL — ABNORMAL LOW (ref 30.0–36.0)
MCV: 70.6 fL — ABNORMAL LOW (ref 80.0–100.0)
Platelets: 196 10*3/uL (ref 150–400)
RBC: 3.6 MIL/uL — ABNORMAL LOW (ref 3.87–5.11)
RDW: 18.5 % — ABNORMAL HIGH (ref 11.5–15.5)
WBC: 13.5 10*3/uL — ABNORMAL HIGH (ref 4.0–10.5)
nRBC: 0 % (ref 0.0–0.2)

## 2020-09-22 LAB — GLUCOSE, CAPILLARY: Glucose-Capillary: 119 mg/dL — ABNORMAL HIGH (ref 70–99)

## 2020-09-22 MED ORDER — LACTATED RINGERS IV BOLUS
1000.0000 mL | Freq: Once | INTRAVENOUS | Status: AC
  Administered 2020-09-22: 1000 mL via INTRAVENOUS

## 2020-09-22 MED ORDER — SODIUM CHLORIDE 0.9 % IV SOLN
INTRAVENOUS | Status: DC | PRN
  Administered 2020-09-22: 250 mL via INTRAVENOUS

## 2020-09-22 MED ORDER — SODIUM CHLORIDE 0.9 % IV SOLN
500.0000 mg | Freq: Once | INTRAVENOUS | Status: AC
  Administered 2020-09-22: 500 mg via INTRAVENOUS
  Filled 2020-09-22: qty 25

## 2020-09-22 NOTE — Lactation Note (Signed)
This note was copied from a baby's chart. Lactation Consultation Note  Patient Name: Laura Hernandez [id number] Date: 09/22/2020 Reason for consult: Follow-up assessment Type of Endocrine Disorder?: Diabetes  Follow up visit to 23 hours infant with 2.64% weight loss at the moment. Mother states she has been putting infant to breast and supplemented with formula. Mother explains she has been pumping but she has not collected any breast milk yet.   Talked to mother about hand expression and demonstrated technique. LC able to observe some glistening. Mother expresses nipple sensitivity when HE.   Encouraged to pump every time she feeds infant. Showed how to use hand pump to stimulate breasts. Reinforced supplementation volumes when bottle-feeding formula.   Encouraged to contact [facility] for support when ready to breastfeed baby and recommended to request help for questions or concerns.    All questions answered at this time.    Plan:   1-Breastfeeding on demand, ensuring a deep, comfortable latch no more than 2-Undressing infant and place skin to skin when ready to breastfeed 3-Keep infant awake during breastfeeding session: massaging breast, infant's hand/shoulder/feet 4-Supplement infant as needed following guidelines, upright position and frequent burping 5-Monitor voids and stools as signs good intake.  6-Pump after breastfeeding for stimulation. 7-Contact LC as needed for feeds/support/concerns/questions   Maternal Data Formula Feeding for Exclusion: No Has patient been taught Hand Expression?: Yes Does the patient have breastfeeding experience prior to this delivery?: No  Feeding Feeding Type: Formula Nipple Type: Extra Slow Flow  Interventions Interventions: DEBP;Hand pump;Hand express;Breast massage;Skin to skin  Lactation Tools Discussed/Used Tools: Pump;Flanges Flange Size: 30 Breast pump type: Double-Electric Breast Pump   Consult Status Consult Status:  Follow-up Date: 09/23/20 Follow-up type: In-patient    Aza Dantes A Higuera Ancidey 09/22/2020, 3:50 PM

## 2020-09-22 NOTE — Lactation Note (Signed)
This note was copied from a baby's chart. Lactation Consultation Note Baby 8 hrs old Mom has GDM. Baby had low glucose after delivery. LC attempted to BF to Lt. Breast. Baby has uncoordinated suck. gags occasionally. Mom has large everted nipple. LC unable to assess it is to large for baby to obtain deep latch with yet d/t baby's tongue thrusting and un-coordinated suck. Mom didn't like that flecks of skin is on baby's mouth from her nipple. Explained that is normal. Encouraged mom to hold baby STS. Hand expression taught w/no colostrum noted. Mom's breast are tender. Mom did have breast changes during pregnancy.  Mom shown how to use DEBP & how to disassemble, clean, & reassemble parts. Mom knows to pump q3h for 15-20 min. #30 flanges given for pumping. Mom pumping w/nothing noted. Explained mom pumping for stimulation. Mom encouraged to feed baby 8-12 times/24 hours and with feeding cues.   Mom slightly upset that she isn't able to get colostrum/BM yet. Worried about baby being hungry. Wants to be able to see him getting something to eat. Mom may have to supplement if baby's glucose cont. To be low.  Newborn feeding habits, behavior, STS, I&O, breast massage, supply and demand discussed. Lactation brochure given.  Patient Name: Laura Hernandez [id number] Date: 09/22/2020 Reason for consult: Initial assessment;Primapara;Early term 37-38.6wks;Maternal endocrine disorder Type of Endocrine Disorder?: Diabetes   Maternal Data Has patient been taught Hand Expression?: Yes Does the patient have breastfeeding experience prior to this delivery?: No  Feeding Feeding Type: Breast Fed Nipple Type: Extra Slow Flow  LATCH Score Latch: Repeated attempts needed to sustain latch, nipple held in mouth throughout feeding, stimulation needed to elicit sucking reflex.  Audible Swallowing: None  Type of Nipple: Everted at rest and after stimulation  Comfort (Breast/Nipple): Soft /  non-tender  Hold (Positioning): Full assist, staff holds infant at breast  LATCH Score: 5  Interventions Interventions: Breast feeding basics reviewed;Adjust position;DEBP;Assisted with latch;Support pillows;Skin to skin;Position options;Breast massage;Hand express;Breast compression  Lactation Tools Discussed/Used Tools: Pump;Flanges Flange Size: 30 Breast pump type: Double-Electric Breast Pump WIC Program: Yes Pump Review: Setup, frequency, and cleaning;Milk Storage Initiated by:: Peri Jefferson RN IBCLC Date initiated:: 09/22/20   Consult Status Consult Status: Follow-up Date: 09/22/20 Follow-up type: In-patient    Suzann Lazaro, Diamond Nickel 09/22/2020, 1:19 AM

## 2020-09-22 NOTE — Progress Notes (Addendum)
Subjective: Postpartum Day 1: Cesarean Delivery Laura Hernandez is a 29 yo G2P1011 s/p C-section. She reports no problems overnight. She is tolerating PO intake, no nausea or vomiting. No problems voiding, no BM yet. Lactation specialist visited yesterday to aid in breastfeeding. Patient decided on POPs for [facility] after being given her options. Pain level is well tolerated. Lochia is decreasing.   Objective: Vital signs in last 24 hours: Temp:  [97.8 F (36.6 C)-98.7 F (37.1 C)] 98.4 F (36.9 C) (11/30 0522) Pulse Rate:  [70-131] 81 (11/30 0222) Resp:  [15-23] 16 (11/30 0522) BP: (92-138)/(35-84) 116/64 (11/30 0222) SpO2:  [87 %-100 %] 100 % (11/30 0222)  Physical Exam:  General: alert, appears stated age and no distress Lochia: appropriate Uterine Fundus: firm Incision: healing well DVT Evaluation: No evidence of DVT seen on physical exam. - Negative Homan's sign. - No cords or calf tenderness. - No significant calf/ankle edema.  Recent Labs    09/20/20 2230 09/22/20 0447  HGB 9.8* 7.6*  HCT 33.5* 25.4*   Capillary Blood Glucose: 119 at 09/22/20 @ 0516.   Assessment/Plan: POD#1 pLTCS- Doing well postoperatively.  - Continue current care.  - Consider discharge home tomorrow.  - Acute blood Loss Anemia  - Hgb 9.8 to 7.6, IV Venofer - Consented for circumcision.  - BC is POPs  #A2GDM: fasting cbg 119, 2hr gtt at 4-6 weeks  Colman Cater PA-S 09/22/2020, 7:48 AM   GME ATTESTATION:  I saw and evaluated the patient. I agree with the findings and the plan of care as documented in the resident's note.  [name], MD OB Fellow, [facility], [facility] 09/22/2020 8:02 AM

## 2020-09-22 NOTE — Anesthesia Postprocedure Evaluation (Signed)
Anesthesia Post Note  Patient: Laura Hernandez  Procedure(s) Performed: CESAREAN SECTION (N/A )     Patient location during evaluation: PACU Anesthesia Type: Epidural Level of consciousness: oriented and awake and alert Pain management: pain level controlled Vital Signs Assessment: post-procedure vital signs reviewed and stable Respiratory status: spontaneous breathing, respiratory function stable and nonlabored ventilation Cardiovascular status: blood pressure returned to baseline and stable Postop Assessment: no headache, no backache, no apparent nausea or vomiting and epidural receding Anesthetic complications: no   No complications documented.  Last Vitals:  Vitals:   09/22/20 0222 09/22/20 0522  BP: 116/64   Pulse: 81   Resp: 15 16  Temp: 36.9 C 36.9 C  SpO2: 100%     Last Pain:  Vitals:   09/22/20 0522  TempSrc: Oral  PainSc:    Pain Goal: Patients Stated Pain Goal: 0 (09/21/20 0320)                 Lucretia Kern

## 2020-09-23 ENCOUNTER — Telehealth: Payer: Self-pay

## 2020-09-23 ENCOUNTER — Ambulatory Visit: Payer: Medicaid Other

## 2020-09-23 LAB — SURGICAL PATHOLOGY

## 2020-09-23 MED ORDER — IBUPROFEN 800 MG PO TABS
800.0000 mg | ORAL_TABLET | Freq: Three times a day (TID) | ORAL | 0 refills | Status: DC
Start: 2020-09-23 — End: 2020-09-24

## 2020-09-23 MED ORDER — NORETHINDRONE 0.35 MG PO TABS: 1.0000 | ORAL_TABLET | Freq: Every day | ORAL | 11 refills | Status: DC

## 2020-09-23 MED ORDER — VITAMIN C 250 MG PO TABS: 250.0000 mg | ORAL_TABLET | ORAL | Status: DC

## 2020-09-23 MED ORDER — COCONUT OIL OIL
1.0000 | TOPICAL_OIL | 0 refills | Status: DC | PRN
Start: 2020-09-23 — End: 2022-07-28

## 2020-09-23 MED ORDER — OXYCODONE HCL 5 MG PO TABS
5.0000 mg | ORAL_TABLET | ORAL | 0 refills | Status: DC | PRN
Start: 2020-09-23 — End: 2020-09-24

## 2020-09-23 MED ORDER — FERROUS SULFATE 325 (65 FE) MG PO TABS: 325.0000 mg | ORAL_TABLET | ORAL | 11 refills | Status: DC

## 2020-09-23 NOTE — Telephone Encounter (Signed)
Patient called in wanting to know if the nurse had gotten her message yet. I verified that a message was sent back to the provider today and that we allow 24-72 hours for the nurse to get back in touch per our protocol. Patient stated she needs this by tomorrow, informed patient I understood and would send another message informing the nurse she was calling in again in regards to this letter she needs.  Could you please advise?

## 2020-09-23 NOTE — Telephone Encounter (Signed)
The pt called in and stated that she would like a return call from the nurse the pt is requesting a copy of the letter verifying  her reduced hours for work. The pt needs it for her unsettlement. Please advise

## 2020-09-24 ENCOUNTER — Ambulatory Visit: Payer: Self-pay

## 2020-09-24 ENCOUNTER — Other Ambulatory Visit (HOSPITAL_COMMUNITY): Payer: Self-pay | Admitting: Obstetrics and Gynecology

## 2020-09-24 MED ORDER — OXYCODONE HCL 5 MG PO TABS
5.0000 mg | ORAL_TABLET | ORAL | 0 refills | Status: DC | PRN
Start: 2020-09-24 — End: 2020-09-24

## 2020-09-24 MED ORDER — AMLODIPINE BESYLATE 5 MG PO TABS: 5.0000 mg | ORAL_TABLET | Freq: Every day | ORAL | 11 refills | Status: DC

## 2020-09-24 MED ORDER — FERROUS SULFATE 325 (65 FE) MG PO TABS: 325.0000 mg | ORAL_TABLET | ORAL | 5 refills | Status: DC

## 2020-09-24 MED ORDER — NORETHINDRONE 0.35 MG PO TABS: 1.0000 | ORAL_TABLET | Freq: Every day | ORAL | 11 refills | Status: DC

## 2020-09-24 MED ORDER — IBUPROFEN 800 MG PO TABS
800.0000 mg | ORAL_TABLET | Freq: Three times a day (TID) | ORAL | 0 refills | Status: DC
Start: 2020-09-24 — End: 2020-10-02

## 2020-09-24 MED FILL — FERROUS SULFATE 325 MG TAB: 325 (65 FE) | 60 days supply | Qty: 30 | Fill #0

## 2020-09-24 MED FILL — oxyCODONE HCL 5 MG TABS: 5 | 5 days supply | Qty: 21 | Fill #0

## 2020-09-24 MED FILL — NORETHINDRONE 0.35 MG TAB: 0.35 | 28 days supply | Qty: 28 | Fill #0

## 2020-09-24 MED FILL — AMLODIPINE BESYLATE 5 MG TA: 5 | 30 days supply | Qty: 30 | Fill #0

## 2020-09-24 NOTE — Discharge Instructions (Signed)
-take tylenol and ibuprofen for pain control, can take oxycodone for breakthrough pain -take ferrous sulfate (iron) with vitamin c every other day -ensure you are drinking plenty of water -take micronor (birth control pills) at the same time everyday -take amlodipine 5mg  (blood pressure medicine) everyday, we will check your blood pressure at your 1 week appt  Postpartum Care After Cesarean Delivery This sheet gives you information about how to care for yourself from the time you deliver your baby to up to 6-12 weeks after delivery (postpartum period). Your health care provider may also give you more specific instructions. If you have problems or questions, contact your health care provider. Follow these instructions at home: Medicines  Take over-the-counter and prescription medicines only as told by your health care provider.  If you were prescribed an antibiotic medicine, take it as told by your health care provider. Do not stop taking the antibiotic even if you start to feel better.  Ask your health care provider if the medicine prescribed to you: ? Requires you to avoid driving or using heavy machinery. ? Can cause constipation. You may need to take actions to prevent or treat constipation, such as:  Drink enough fluid to keep your urine pale yellow.  Take over-the-counter or prescription medicines.  Eat foods that are high in fiber, such as beans, whole grains, and fresh fruits and vegetables.  Limit foods that are high in fat and processed sugars, such as fried or sweet foods. Activity  Gradually return to your normal activities as told by your health care provider.  Avoid activities that take a lot of effort and energy (are strenuous) until approved by your health care provider. Walking at a slow to moderate pace is usually safe. Ask your health care provider what activities are safe for you. ? Do not lift anything that is heavier than your baby or 10 lb (4.5 kg) as told by your  health care provider. ? Do not vacuum, climb stairs, or drive a car for as long as told by your health care provider.  If possible, have someone help you at home until you are able to do your usual activities yourself.  Rest as much as possible. Try to rest or take naps while your baby is sleeping. Vaginal bleeding  It is normal to have vaginal bleeding (lochia) after delivery. Wear a sanitary pad to absorb vaginal bleeding and discharge. ? During the first week after delivery, the amount and appearance of lochia is often similar to a menstrual period. ? Over the next few weeks, it will gradually decrease to a dry, yellow-brown discharge. ? For most women, lochia stops completely by 4-6 weeks after delivery. Vaginal bleeding can vary from woman to woman.  Change your sanitary pads frequently. Watch for any changes in your flow, such as: ? A sudden increase in volume. ? A change in color. ? Large blood clots.  If you pass a blood clot, save it and call your health care provider to discuss. Do not flush blood clots down the toilet before you get instructions from your health care provider.  Do not use tampons or douches until your health care provider says this is safe.  If you are not breastfeeding, your period should return 6-8 weeks after delivery. If you are breastfeeding, your period may return anytime between 8 weeks after delivery and the time that you stop breastfeeding. Perineal care   If your C-section (Cesarean section) was unplanned, and you were allowed to labor and  push before delivery, you may have pain, swelling, and discomfort of the tissue between your vaginal opening and your anus (perineum). You may also have an incision in the tissue (episiotomy) or the tissue may have torn during delivery. Follow these instructions as told by your health care provider: ? Keep your perineum clean and dry as told by your health care provider. Use medicated pads and pain-relieving sprays  and creams as directed. ? If you have an episiotomy or vaginal tear, check the area every day for signs of infection. Check for:  Redness, swelling, or pain.  Fluid or blood.  Warmth.  Pus or a bad smell. ? You may be given a squirt bottle to use instead of wiping to clean the perineum area after you go to the bathroom. As you start healing, you may use the squirt bottle before wiping yourself. Make sure to wipe gently. ? To relieve pain caused by an episiotomy, vaginal tear, or hemorrhoids, try taking a warm sitz bath 2-3 times a day. A sitz bath is a warm water bath that is taken while you are sitting down. The water should only come up to your hips and should cover your buttocks. Breast care  Within the first few days after delivery, your breasts may feel heavy, full, and uncomfortable (breast engorgement). You may also have milk leaking from your breasts. Your health care provider can suggest ways to help relieve breast discomfort. Breast engorgement should go away within a few days.  If you are breastfeeding: ? Wear a bra that supports your breasts and fits you well. ? Keep your nipples clean and dry. Apply creams and ointments as told by your health care provider. ? You may need to use breast pads to absorb milk leakage. ? You may have uterine contractions every time you breastfeed for several weeks after delivery. Uterine contractions help your uterus return to its normal size. ? If you have any problems with breastfeeding, work with your health care provider or a Advertising copywriter.  If you are not breastfeeding: ? Avoid touching your breasts as this can make your breasts produce more milk. ? Wear a well-fitting bra and use cold packs to help with swelling. ? Do not squeeze out (express) milk. This causes you to make more milk. Intimacy and sexuality  Ask your health care provider when you can engage in sexual activity. This may depend on your: ? Risk of infection. ? Healing  rate. ? Comfort and desire to engage in sexual activity.  You are able to get pregnant after delivery, even if you have not had your period. If desired, talk with your health care provider about methods of family planning or birth control (contraception). Lifestyle  Do not use any products that contain nicotine or tobacco, such as cigarettes, e-cigarettes, and chewing tobacco. If you need help quitting, ask your health care provider.  Do not drink alcohol, especially if you are breastfeeding. Eating and drinking   Drink enough fluid to keep your urine pale yellow.  Eat high-fiber foods every day. These may help prevent or relieve constipation. High-fiber foods include: ? Whole grain cereals and breads. ? Brown rice. ? Beans. ? Fresh fruits and vegetables.  Take your prenatal vitamins until your postpartum checkup or until your health care provider tells you it is okay to stop. General instructions  Keep all follow-up visits for you and your baby as told by your health care provider. Most women visit their health care provider for a postpartum  checkup within the first 3-6 weeks after delivery. Contact a health care provider if you:  Feel unable to cope with the changes that a new baby brings to your life, and these feelings do not go away.  Feel unusually sad or worried.  Have breasts that are painful, hard, or turn red.  Have a fever.  Have trouble holding urine or keeping urine from leaking.  Have little or no interest in activities you used to enjoy.  Have not breastfed at all and you have not had a menstrual period for 12 weeks after delivery.  Have stopped breastfeeding and you have not had a menstrual period for 12 weeks after you stopped breastfeeding.  Have questions about caring for yourself or your baby.  Pass a blood clot from your vagina. Get help right away if you:  Have chest pain.  Have difficulty breathing.  Have sudden, severe leg pain.  Have  severe pain or cramping in your abdomen.  Bleed from your vagina so much that you fill more than one sanitary pad in one hour. Bleeding should not be heavier than your heaviest period.  Develop a severe headache.  Faint.  Have blurred vision or spots in your vision.  Have a bad-smelling vaginal discharge.  Have thoughts about hurting yourself or your baby. If you ever feel like you may hurt yourself or others, or have thoughts about taking your own life, get help right away. You can go to your nearest emergency department or call:  Your local emergency services (911 in the [place]  A suicide crisis helpline, such as the National Suicide Prevention Lifeline at 947-260-8423. This is open 24 hours a day. Summary  The period of time from when you deliver your baby to up to 6-12 weeks after delivery is called the postpartum period.  Gradually return to your normal activities as told by your health care provider.  Keep all follow-up visits for you and your baby as told by your health care provider. This information is not intended to replace advice given to you by your health care provider. Make sure you discuss any questions you have with your health care provider. Document Revised: 05/30/2018 Document Reviewed: 05/30/2018 Elsevier Patient Education  2020 [employer].

## 2020-09-24 NOTE — Lactation Note (Signed)
This note was copied from a baby's chart. Lactation Consultation Note  Patient Name: Laura Hernandez QDIYM'[id number] Date: 09/24/2020 Reason for consult: Follow-up assessment Type of Endocrine Disorder?: Diabetes  Follow up visit to 71 hours old infant with 5.79% weight loss currently and TC bili 18.7 at ~61HOL.  Infant is sleeping upon arrival. Mother states infant has been taking 33-36 mL of formula per feeding. Infant has been having good output (3/3).  Mother is experiencing some signs of engorgement as firmness and fullness. Mother explains she has been pumping with DEBP and manual pump but unable to collect any EBM. Reviewed hand expression technique with mother but mother verbalizes nipple tenderness when attempting to express.  Talked about using manual pump and demonstrated use. LC did not observe any colostrum.  Mother states she has not been able to rest properly. Encouraged maternal rest and hydration. Talked to mother about using moist heat and/or taking a shower before starting to pump.  Encouraged mother contact LC for any questions or support.   Feeding Feeding Type: Bottle Fed - Formula Nipple Type: Extra Slow Flow  Interventions Interventions: DEBP;Hand pump;Coconut oil;Expressed milk  Lactation Tools Discussed/Used Tools: Pump Flange Size: 30 Breast pump type: Double-Electric Breast Pump;Manual   Consult Status Consult Status: Follow-up Date: 09/25/20 Follow-up type: In-patient    Cleon Thoma A Higuera Ancidey 09/24/2020, 3:37 PM

## 2020-09-24 NOTE — Telephone Encounter (Signed)
Pt called in and stated that she wanted to know the status of the paperwork. I see the message was sent to you. I told the pt that you were in a meeting and would reach out to her later. The pt verbally understood .please advise

## 2020-09-24 NOTE — Discharge Summary (Addendum)
Postpartum Discharge Summary  Date of Service updated 09/24/20    Patient Name: Laura Hernandez DOB: 1991-10-12 MRN: [id number]  Date of admission: 09/20/2020 Delivery date:09/21/2020  Delivering provider: Aletha Halim  Date of discharge: 09/24/2020  Admitting diagnosis: GDM, class A2 [O24.419] Intrauterine pregnancy: [redacted]w[redacted]d    Secondary diagnosis:  Principal Problem:   Cesarean delivery delivered Active Problems:   HSV-2 (herpes simplex virus 2) infection   Supervision of high risk pregnancy, antepartum   Urinary tract colonization by group B Streptococcus affecting pregnancy   Trichomonas infection   Situational mixed anxiety and depressive disorder   Gestational diabetes mellitus (GDM) in third trimester   Iron deficiency anemia   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 yo G2P1011 at 346w6das admitted to the hospital 09/20/2020 for latent labor s/p PROM at 2045 on 09/20/20. Patient had a labor course significant for non-reassuring fetal heart tones remote from delivery. The patient went for cesarean section due to Non-Reassuring FHR. Delivery details are as follows: Membrane Rupture Time/Date: 8:45 PM ,09/20/2020   Delivery Method:C-Section, Low Transverse  Details of operation can be found in separate operative Note.  Patient had an uncomplicated postpartum course. She is ambulating, tolerating a regular diet, passing flatus, and urinating well.  Patient is discharged home in stable condition on 09/24/20.      Newborn Data: Birth date:09/21/2020  Birth  time:4:36 PM  Gender:Female  Living status:Living  Apgars:8 ,8  Weight:3025 g                                 Magnesium Sulfate received: No BMZ received: Yes Rhophylac:N/A MMR:N/A T-DaP:Given prenatally  Flu: No Transfusion:No  Physical exam  Vitals:   09/23/20 0534 09/23/20 1447 09/23/20 2015 09/24/20 0447  BP: 114/61 132/69 132/67 135/76  Pulse: 79 80 86 82  Resp: '15 18 18 18  ' Temp: 98.2 F (36.8 C) 99.2 F (37.3 C) 99.2 F (37.3 C) 98.9 F (37.2 C)  TempSrc: Oral Oral Axillary Oral  SpO2: 100%  98% 100%  Weight:      Height:       General: alert, cooperative and no distress Lochia: appropriate Uterine Fundus: firm Incision: Dressing is clean, dry, and intact DVT Evaluation: No evidence of DVT seen on physical exam. Labs: Lab Results  Component Value Date   WBC 13.5 (H) 09/22/2020   HGB 7.6 (L) 09/22/2020   HCT 25.4 (L) 09/22/2020   MCV 70.6 (L) 09/22/2020   PLT 196 09/22/2020   CMP Latest Ref Rng & Units 09/06/2020  Glucose 70 - 99 mg/dL 120(H)  BUN 6 - 20 mg/dL <5(L)  Creatinine 0.44 - 1.00 mg/dL 0.64  Sodium 135 - 145 mmol/L 135  Potassium 3.5 - 5.1 mmol/L 4.1  Chloride 98 - 111 mmol/L 106  CO2 22 - 32 mmol/L 21(L)  Calcium  8.9 - 10.3 mg/dL 9.2  Total Protein 6.5 - 8.1 g/dL 6.2(L)  Total Bilirubin 0.3 - 1.2 mg/dL 0.4  Alkaline Phos 38 - 126 U/L 134(H)  AST 15 - 41 U/L 16  ALT 0 - 44 U/L 15   Edinburgh Score: Edinburgh Postnatal Depression Scale Screening Tool 09/23/2020  I have been able to laugh and see the funny side of things. 0  I have looked forward with enjoyment to things. 0  I have blamed myself unnecessarily when things went wrong. 1  I have been anxious or worried for no good reason. 1  I have felt scared or panicky for no good reason. 0  Things have been getting on top of me. 0  I have been so unhappy that I have had difficulty sleeping. 0  I have felt sad or miserable. 0  I have been so unhappy that I have been crying. 0  The  thought of harming myself has occurred to me. 0  Edinburgh Postnatal Depression Scale Total 2     After visit meds:  Allergies as of 09/24/2020      Reactions   Latex Itching, Rash      Medication List    STOP taking these medications   Accu-Chek Guide test strip Generic drug: glucose blood   Accu-Chek Guide w/Device Kit   Accu-Chek Softclix Lancets lancets   cyclobenzaprine 10 MG tablet Commonly known as: FLEXERIL   iron polysaccharides 150 MG capsule Commonly known as: NIFEREX   metFORMIN 1000 MG tablet Commonly known as: GLUCOPHAGE   miconazole 2 % vaginal cream Commonly known as: MONISTAT 7   valACYclovir 1000 MG tablet Commonly known as: Valtrex     TAKE these medications   acetaminophen 500 MG tablet Commonly known as: TYLENOL Take 1,000 mg by mouth 2 (two) times daily as needed for pain.   albuterol 108 (90 Base) MCG/ACT inhaler Commonly known as: VENTOLIN HFA Inhale 1-2 puffs into the lungs every 6 (six) hours as needed for wheezing or shortness of breath.   amLODipine 5 MG tablet Commonly known as: NORVASC Take 1 tablet (5 mg total) by mouth daily.   coconut oil Oil Apply 1 application topically as needed.   ferrous sulfate 325 (65 FE) MG tablet Take 1 tablet (325 mg total) by mouth every other day.   ibuprofen 800 MG tablet Commonly known as: ADVIL Take 1 tablet (800 mg total) by mouth every 8 (eight) hours.   norethindrone 0.35 MG tablet Commonly known as: Ortho Micronor Take 1 tablet (0.35 mg total) by mouth daily.   oxyCODONE 5 MG immediate release tablet Commonly known as: Oxy IR/ROXICODONE Take 1-2 tablets (5-10 mg total) by mouth every 4 (four) hours as needed for moderate pain.   PRENATAL VITAMIN PO Take by mouth.   vitamin C 250 MG tablet Commonly known as: ASCORBIC ACID Take 1 tablet (250 mg total) by mouth every other day. Take with iron.        Discharge home in stable condition Infant Feeding: Breast Infant  Disposition:home with mother Discharge instruction: per After Visit Summary and Postpartum booklet. Activity: Advance as tolerated. Pelvic rest for 6 weeks.  Diet: routine diet Future Appointments: Future Appointments  Date Time Provider [place]  09/30/2020  1:15 PM [facility] NURSE [facility]  10/29/2020  8:15 AM Aletha Halim, MD [facility]   Follow up Visit: Message sent to [facility] on 09/21/20.  Please schedule this patient for a In person postpartum visit  in 4 weeks with the following provider: Any provider. Additional Postpartum F/U:2 hour GTT and Incision check 1 week, postpartum mood check 1 week. 1 week BP check as well. High risk pregnancy complicated by: X6DSW (metformin), anxiety/depression, Trichomonas s/p negative TOC, GBS positive urine culture Delivery mode:  C-Section, Low Transverse  Anticipated Birth Control:  POPs, rx sent to pharmacy  97/06/1503 Arrie Senate, MD

## 2020-09-24 NOTE — Lactation Note (Signed)
This note was copied from a baby's chart. Lactation Consultation Note  Patient Name: Laura Hernandez [id number] Date: 09/24/2020 Reason for consult: Follow-up assessment;Primapara;1st time breastfeeding;Early term 37-38.6wks;Other (Comment);Mother's request (mom paged to have flanges checked and to check on pumping with DEBP) Type of Endocrine Disorder?: Diabetes  Baby is 64 hours old  2nd LC visit today, mom requested to help with the DEBP.  Tools: Flanges;Pump Flange Size: 30 (started out on the left with #30 Flange ( and stayed with the #30 for the pumping ) , switched the right to the #27 F and ended up switching back to the #30 when the pressure increased.) LC felt the right breast with #30 F was boarder line to large and when the pump pressure increased per mom uncomfortable and more comfortable with the #9F . LC reviewed supply and demand / importance of consistent pumping after baby feeds at the breast.  Mom was still pumping for 15 -20 mins.  Per mom the baby does latch well at the breast.  Baby still on double photo tx.     Maternal Data    Feeding Feeding Type: Bottle Fed - Formula Nipple Type: Extra Slow Flow  LATCH Score                   Interventions Interventions: Breast feeding basics reviewed;DEBP  Lactation Tools Discussed/Used Tools: Flanges;Pump Flange Size: 30 (started out on the left with #30 Flange ( and stayed with the #30 for the pumping ) , switched the right to the #27 F and ended up switching back to the #30 when the pressure increased.) Breast pump type: Double-Electric Breast Pump   Consult Status Consult Status: Follow-up Date: 09/25/20 Follow-up type: In-patient    Laura Hernandez 09/24/2020, 7:07 PM

## 2020-09-24 NOTE — Telephone Encounter (Signed)
Informed pt that the only restrictions I see are no no excessive bending or stooping. Frequent breaks. Sitting if possible. Can work up to 8 hours a day.   Pt states she needs the letter or FMLA we completed that states she was on reduced hours. Advised pt that I will need to review her chart and get back in touch with her. Pt aware it may be Monday before she hears from me. Pt voiced understanding.

## 2020-09-24 NOTE — Progress Notes (Signed)
I offered support to pt who was feeling overwhelmed by her baby's length of stay being longer than expected.  She had some confusion about the phototherapy and thought her baby would be on the lights for an extended time, but was feeling much more relieved to learn it was temporary. Delivery was stressful and ended up in a cesarean delivery because baby's heart rate was up and down.  She has good support, but they aren't able to up at the hospital much and so she feels somewhat isolated from her support.  I normalized her feelings of overwhelm and encouraged her to be really gentle with herself since she is in all new territory as a mom.  She stated that she has experienced some anxiety before and that it felt similar, but this feels more acute.  I did some education about PMAD and encouraged her to keep an eye on any symptoms when she is at home.  I also offered prayer and reading of a scripture at her request.   Kathleen Argue, Bcc Pager, [phone] 4:32 PM

## 2020-09-25 ENCOUNTER — Ambulatory Visit: Payer: Self-pay

## 2020-09-25 NOTE — Lactation Note (Signed)
This note was copied from a baby's chart. Lactation Consultation Note  Patient Name: Laura Hernandez IEPPI'R Date: 09/25/2020 Reason for consult: 1st time breastfeeding;Early term 37-38.6wks;Hyperbilirubinemia;Infant weight loss  Mom reports they are going home.  LC filled out pump loaner form.  Waiting on her brother to get here to pick her up with the 30.00.  Discussed engorgement protocol again.  Urged mom to get home and ice and pump.  Discussed cabbage leaves as well to help with sever engorgement. Discussed being home may help with pumping some and mom letting down to the pump.   Infant alert and cuing on arrival.  Asked mom if she would like to do some STS and see if he could latch she reported she was too painful. Urged mom to let her RN know when she is ready to leave to issue [facility] loaner pump.    Maternal Data    Feeding Feeding Type: Bottle Fed - Formula  LATCH Score                   Interventions Interventions: DEBP;Breast massage  Lactation Tools Discussed/Used Tools: Pump Flange Size: 36 Breast pump type: Double-Electric Breast Pump   Consult Status Consult Status: Follow-up Date: 09/26/20 Follow-up type: In-patient    Jerrell Hart [name] 09/25/2020, 10:40 PM

## 2020-09-25 NOTE — Lactation Note (Signed)
This note was copied from a baby's chart. Lactation Consultation Note  Patient Name: Laura Hernandez [id number]'Z Date: 09/25/2020 Reason for consult: 1st time breastfeeding;Early term 37-38.6wks;Hyperbilirubinemia;Infant weight loss Mom requested LC stay with her duration of pumping and LC did so as requested.  Mom teary with now engorged breasts. Breasts are now engorged and red and extremely painful mom reports. Mom Has not used DEBP since last visit.  LC gave mom Engorgement Protocol handout and reviewed again with mom importance of pumping every 2-3 hours and sooner if needed. Mom does not have a DEBP for home use.  Mom reports she spoke to [facility] earlier who told her to get a [facility] loaner from the hospital when you are d/c until you can get a pump from [place]. Discussed loaner pump with mom. Mom does not have thirty dollars but hoping a family member will bring it to her whe nd/c.  Mom hoping to be d/c this pm.  [facility] referral filled out and faxed to [facility] office.  LC left mom laying flat.  Mom has no family coming this pm to stay with her. But someone may be able to bring her some ibuprofen to help with swelling.  RN reports mom had none ordered.  Mom asked if LC would follow up with her later in case they got to go home.          Maternal Data    Feeding Feeding Type: Bottle Fed - Formula  LATCH Score                   Interventions Interventions: DEBP;Breast massage  Lactation Tools Discussed/Used Tools: Pump Flange Size: 36 Breast pump type: Double-Electric Breast Pump   Consult Status Consult Status: Follow-up Date: 09/26/20 Follow-up type: In-patient    [facility] [name] 09/25/2020, 10:29 PM

## 2020-09-25 NOTE — Social Work (Signed)
CSW received consult for hx of Anxiety and Depression.  CSW met with MOB to offer support and complete assessment.     CSW introduced self and role. CSW observed baby [name] in bassinet sleeping and MOB in bed. CSW informed MOB of the reason for consult. MOB expressed understanding and shared she is doing well since giving birth. MOB reported she does not have a diagnosis of anxiety or depression. MOB stated she experienced situational anxiety related to work. MOB reported she is no longer with that job and the symptoms subsided. MOB stated she has never taken medications or attended therapy for symptoms. MOB reported she has a strong support system, identifying siblings and FOB as supports. MOB stated she is not currently experiencing any SI, HI or DV.   CSW provided education regarding the baby blues period vs. perinatal mood disorders, discussed treatment and gave resources for mental health follow up if concerns arise.  CSW recommends self-evaluation during the postpartum time period using the New Mom Checklist from Postpartum Progress and encouraged MOB to contact a medical professional if symptoms are noted at any time. MOB expressed understanding and expressed she has not experienced any PP symptoms in the days since birth.  CSW provided review of Sudden Infant Death Syndrome (SIDS) precautions.  MOB reported newborn will sleep in a crib and bassinet. MOB identified [facility] for follow-up care. MOB denies any transportation barriers. MOB expressed she has all of the essential needs for newborn to discharge home, including a new carseat. MOB declined any referrals to community resources.   CSW identifies no further need for intervention and no barriers to discharge at this time.  Laura Hernandez, LCSWA Clinical Social Work [facility] [phone]   

## 2020-09-25 NOTE — Lactation Note (Signed)
This note was copied from a baby's chart. Lactation Consultation Note  Patient Name: Boy Anu Stagner RCVEL'F Date: 09/25/2020  Baby boy Almeta Monas now 88 hours old.  Born at 37 weeks and 6 days gestation.   Infant currently under phototherapy.Mom reports her breasts are engorged and uncomfortable.  Mom teary on arrival.  Inquired about pumping. Mom reported last pumping session was earlier this morning and she didn't get anything.   LC asked mom if she could see her breasts.  Mom agreed. Mom has very full edematous breasts. Possible slight  engorgement, mostly normal fullness from milk coming to volume.  Mom very uncomfortable. Knotty in quadrants towards chest wall on both breats. Left breast much worse than the right. Mom reports she did not get anything with pumping. Discussed Reverse pressure softening, massage and ice.  Attempt to assist with Reverse pressure Softening and massage on right breast. Mom jerked back and reports painful.  LC demo on her self and mom started to do it to herself.  After some softening of nipple/areolar complex, LC assisted with pumping with DEBP with mom on right breast.  Flange appears to fit well however not getting much movement in the tunnel back and forth.  Moved mom yp to a 36 flange temporarily to see if it would help with flow. After a few minutes and mom massaging right quadrant, colostrum started to flow.  Mom removed about 10 ml from right breast.  Mom reports a little more comfortable.   LC assist with left breast.  Mom did massage and reverse pressure softening on that breasts as well.  Started out with a 30 on that breast and mom reports discomfort. Moved mom up to a 36 flange on that breast as well temporarily. Mom pumped on that breast close to 15 minutes.  Mom massaging.  Milk did not start flowing on the left breats.  Laid mom flat with ice packs.  Urged her to massage nipple/areolar complex and ice.  Urged her to try and direct fluid toward arm pit.  Urged mom  to make sure and pump on a regular schedule.  Urged her to try and pump every 2 hours for right now and even possibly at 1 and a half hours if her breats were getting uncomfortable. Mom asked to be checked on later.  Urged her to call lactation as needed.   Maternal Data    Feeding Feeding Type: Bottle Fed - Formula Nipple Type: Extra Slow Flow  LATCH Score                   Interventions    Lactation Tools Discussed/Used     Consult Status      Shakenna Herrero [name] 09/25/2020, 1:39 PM

## 2020-09-25 NOTE — Telephone Encounter (Signed)
I do not have any fmla forms for her for that date.

## 2020-09-25 NOTE — Lactation Note (Addendum)
This note was copied from a baby's chart. Lactation Consultation Note  Patient Name: Laura Hernandez YKDXI'P Date: 09/25/2020 Reason for consult: 1st time breastfeeding;Early term 37-38.6wks;Hyperbilirubinemia;Infant weight loss  P1, 4 day old ETI, female infant with -5% weight loss with hyperbilirubinemia.  Infant is currently on bili lights,  being supplemented with formula and given EBM that mom has, has not been latching at the breast,  mom has primary engorgement with pain.  Tools given: mom has 36 breast flange due to having large breast, ice due primary engorgement, using DEBP to help establish milk supply due to infant not latching at the breast and being jaundice. LC entered the room at mom request for [facility] services  due to painful breast, LC notice redness on  both breast ( laterally) , mom's breast  was warm to the touch. Mom has been discharge but  Infant is a baby patient, mom doesn't have any pain meds to help with the engorgement, she was waiting for a family member to bring (ibuprofen). Mom has not been compliant with previous pumping advice given by LC earlier today. I assisted LC ( [place]) in helping mom to alleviate engorgement and fit mom with the  correct breast flange. Mom was given ice that  was placed on top of a cloth to help reduce swelling ( edema) in breast as well as engorement, mom massage her own breast due to breast tenderness, painful touch and breast soreness. Mom put coconut oil on breast prior to massage and coconut oil on breast flanges. LC assistance mom in using hand pump at first,  that was coated with coconut oil,a  few drops were expressed and after mom's milk started flowing,  mom was switched to the DEBP on low setting that was comfortable for her.  LC ( Baer Hinton ) left room and [facility]) other LC continued to work with mom in alleviating the engorgement. Mom understands that she needs to pump when breast starts to feel full or tingle to prevent engorgement not to go 3  hours without pumping since infant is not latching at the breast at this time. Continue to use ice on breast due swelling until engorement is alleviated.  LC suggested once engorgement is alleviate to work towards latching infant at the breast. Mom knows to give infant all of her EBM from pumping first before giving the formula.  LC encouraged mom to attend [facility] after discharge (free). Mom to call for appointment with [facility] outpatient clinic to help with infant  latching at the breast.   Maternal Data    Feeding    LATCH Score                   Interventions Interventions: DEBP;Breast massage  Lactation Tools Discussed/Used Tools: Pump Flange Size: 36 Breast pump type: Double-Electric Breast Pump   Consult Status Consult Status: Follow-up Date: 09/26/20 Follow-up type: In-patient    [name] 09/25/2020, 8:37 PM

## 2020-09-29 ENCOUNTER — Telehealth: Payer: Self-pay

## 2020-09-29 ENCOUNTER — Other Ambulatory Visit: Payer: Self-pay

## 2020-09-29 MED ORDER — DICLOXACILLIN SODIUM 500 MG PO CAPS: 500.0000 mg | ORAL_CAPSULE | Freq: Four times a day (QID) | ORAL | 0 refills | Status: DC

## 2020-09-29 NOTE — Telephone Encounter (Signed)
Received message from pt stating she wanted ibuprofen for breast pain. Pt stated she has experienced off and on chills, tender breast, engorgement, and "hot" feeling on breast. Pt stated she had a temperature of 100.0 degrees farenheit yesterday. Advised pt to stand in hot shower, and pump more if able. Dicloxacillin sent in to pharmacy per Dr. Shawnie Pons. Pt verbalizes understanding.

## 2020-09-30 ENCOUNTER — Ambulatory Visit: Payer: Medicaid Other

## 2020-10-01 ENCOUNTER — Ambulatory Visit (INDEPENDENT_AMBULATORY_CARE_PROVIDER_SITE_OTHER): Payer: Medicaid Other

## 2020-10-01 ENCOUNTER — Other Ambulatory Visit: Payer: Self-pay

## 2020-10-01 NOTE — Progress Notes (Signed)
Subjective:     Laura Hernandez is a 29 y.o. female who presents to the clinic 2 weeks status post cesarean section for incision check, mood check, and breast evaluation.       Objective:    BP 132/86   Pulse 97   Temp 98.3 F (36.8 C)  General:  alert     Incision:   no drainage, no erythema, no hernia, no seroma, no swelling, no dehiscence, incision well approximated     Assessment:    Incision healing well, small area separated, advised to place Band-Aid.  PP Screen: negative  Dr. Vergie Living assessed breast and informed pt to continue to pump as advised and continue taking antibiotics.    Plan:    Continue any current medications.   Wound care discussed. Follow up: 1 week for incision check and breast evaluation.  Leola Brazil, CMA

## 2020-10-02 ENCOUNTER — Other Ambulatory Visit: Payer: Self-pay | Admitting: Obstetrics and Gynecology

## 2020-10-02 MED ORDER — OXYCODONE-ACETAMINOPHEN 5-325 MG PO TABS
1.0000 | ORAL_TABLET | Freq: Four times a day (QID) | ORAL | 0 refills | Status: DC | PRN
Start: 2020-10-02 — End: 2022-07-28

## 2020-10-02 MED ORDER — IBUPROFEN 600 MG PO TABS
600.0000 mg | ORAL_TABLET | Freq: Four times a day (QID) | ORAL | 0 refills | Status: DC | PRN
Start: 2020-10-02 — End: 2020-10-07

## 2020-10-05 NOTE — Telephone Encounter (Signed)
Pt informed we do not have any FMLA forms for reduced hours per [facility] and [facility].  PT states she found what she was looking for.

## 2020-10-05 NOTE — Telephone Encounter (Signed)
There was no FMLA paperwork filled at at that time I do believe. She only requested work notices in the beginning for the days that she missed work.

## 2020-10-06 ENCOUNTER — Other Ambulatory Visit: Payer: Self-pay

## 2020-10-06 ENCOUNTER — Other Ambulatory Visit: Payer: Self-pay | Admitting: Obstetrics and Gynecology

## 2020-10-06 ENCOUNTER — Ambulatory Visit (INDEPENDENT_AMBULATORY_CARE_PROVIDER_SITE_OTHER): Payer: Medicaid Other | Admitting: Obstetrics and Gynecology

## 2020-10-06 VITALS — BP 124/83 | HR 96 | Temp 99.1°F

## 2020-10-06 DIAGNOSIS — R519 Headache, unspecified: Secondary | ICD-10-CM

## 2020-10-06 DIAGNOSIS — O9089 Other complications of the puerperium, not elsewhere classified: Secondary | ICD-10-CM | POA: Diagnosis not present

## 2020-10-06 MED ORDER — MAGNESIUM MALATE 1250 (141.7 MG) MG PO TABS: 1.0000 | ORAL_TABLET | Freq: Every day | ORAL | 0 refills | Status: DC

## 2020-10-06 NOTE — Progress Notes (Signed)
Obstetrics and Gynecology Visit Return Patient Evaluation  Appointment Date: 10/07/2020  Primary Care Provider: Patient, No Pcp Per  OBGYN Clinic: [facility] for [facility] Complaint: work in visit for HA, shoulder and neck pain  History of Present Illness:  Laura Hernandez is a 29 y.o. with above CC.   Patient not producing much milk. Breast s/s are stable vs last week. CC is better with new bra today.   Review of Systems:  as noted in the History of Present Illness.   Patient Active Problem List   Diagnosis Date Noted  . Cesarean delivery delivered 09/21/2020  . GDM, class A2 09/20/2020  . Positive GBS test 09/20/2020  . Sciatic leg pain 09/09/2020  . Vaginal bleeding in pregnancy 09/09/2020  . BMI 40.0-44.9, adult (HCC) 08/25/2020  . Obesity in pregnancy 08/25/2020  . Pruritus 08/25/2020  . Gestational diabetes mellitus (GDM) in third trimester 08/04/2020  . Iron deficiency anemia 08/04/2020  . 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 Situational mixed anxiety and depressive disorder 07/01/2020  . Back pain affecting pregnancy, antepartum 07/01/2020  . Trichomonas infection 06/05/2020  . Urinary tract colonization by group B Streptococcus affecting pregnancy 03/31/2020  . Supervision of high risk pregnancy, antepartum 12/06/2013  . HSV-2 (herpes simplex virus 2) infection 09/18/2013   Medications:  Laura Hernandez "Laura Hernandez" had no medications administered during this visit. Current Outpatient Medications  Medication Sig Dispense Refill  . amLODipine (NORVASC) 5 MG tablet Take 1 tablet (5 mg total) by mouth daily. 30 tablet 11  . oxyCODONE (OXY IR/ROXICODONE) 5 MG immediate release tablet Take 1-2 tablets (5-10 mg total) by mouth every 4 (four) hours as needed for moderate pain. 21 tablet 0  . acetaminophen (TYLENOL) 500 MG tablet Take 1,000 mg by mouth 2 (two) times daily as needed for pain.  (Patient not taking:  Reported on 10/01/2020)    . albuterol (PROVENTIL HFA;VENTOLIN HFA) 108 (90 Base) MCG/ACT inhaler Inhale 1-2 puffs into the lungs every 6 (six) hours as needed for wheezing or shortness of breath. (Patient not taking: Reported on 10/01/2020) 1 Inhaler 0  . coconut oil OIL Apply 1 application topically as needed.  0  . dicloxacillin (DYNAPEN) 500 MG capsule Take 1 capsule (500 mg total) by mouth 4 (four) times daily. (Patient not taking: Reported on 10/06/2020) 40 capsule 0  . ferrous sulfate 325 (65 FE) MG tablet Take 1 tablet (325 mg total) by mouth every other day. (Patient not taking: Reported on 10/01/2020) 30 tablet 5  . ibuprofen (ADVIL) 600 MG tablet Take 1 tablet (600 mg total) by mouth every 6 (six) hours as needed. 30 tablet 0  . Magnesium Malate 1250 (141.7 Mg) MG TABS Take 1 tablet by mouth daily. 30 tablet 0  . norethindrone (ORTHO MICRONOR) 0.35 MG tablet Take 1 tablet (0.35 mg total) by mouth daily. 28 tablet 11  . oxyCODONE-acetaminophen (PERCOCET/ROXICET) 5-325 MG tablet Take 1 tablet by mouth every 6 (six) hours as needed. 5 tablet 0  . Prenatal Vit-Fe Fumarate-FA (PRENATAL VITAMIN PO) Take by mouth.    . vitamin C (ASCORBIC ACID) 250 MG tablet Take 1 tablet (250 mg total) by mouth every other day. Take with iron.     No current facility-administered medications for this visit.    Allergies: is allergic to latex.  Physical Exam:  BP 124/83   Pulse 96   Temp 99.1 F (37.3 C)  There is no height or weight on  file to calculate BMI. General appearance: Well nourished, well developed female in no acute distress.  CV: normal s1 and s2, no MRGs Pulm: CTAB Abdomen: diffusely non tender to palpation, non distended, and no masses, hernias Neuro/Psych:  Normal mood and affect.  Brachial 1+ b/l  Assessment: pt doing well  Plan: No s/s of pre-eclampsia. Likely musculoskeletal. Mg qday recommended and RTC later this week for regularly scheduled visit  If persistent and BPs normal,  send to [facility] triage for labs and anesthesia eval for spinal HA   Cornelia Copa MD Attending Center for [facility])

## 2020-10-06 NOTE — Progress Notes (Signed)
Has HA, neck and shoulder pain that started yesterday. States just doesn't feel well. Breasts are still engorged and tender

## 2020-10-07 ENCOUNTER — Other Ambulatory Visit: Payer: Self-pay | Admitting: Obstetrics and Gynecology

## 2020-10-07 ENCOUNTER — Encounter: Payer: Self-pay | Admitting: *Deleted

## 2020-10-07 ENCOUNTER — Other Ambulatory Visit: Payer: Self-pay | Admitting: *Deleted

## 2020-10-07 MED ORDER — IBUPROFEN 600 MG PO TABS
600.0000 mg | ORAL_TABLET | Freq: Four times a day (QID) | ORAL | 0 refills | Status: DC | PRN
Start: 2020-10-07 — End: 2022-07-28

## 2020-10-07 NOTE — Progress Notes (Signed)
Patient was assessed and managed by nursing staff during this encounter. I have reviewed the chart and agree with the documentation and plan. I have also made any necessary editorial changes.  Hayk Divis, MD 10/07/2020 11:25 AM   

## 2020-10-08 ENCOUNTER — Ambulatory Visit: Payer: Medicaid Other

## 2020-10-21 ENCOUNTER — Telehealth: Payer: Self-pay | Admitting: Radiology

## 2020-10-21 MED ORDER — FLUCONAZOLE 150 MG PO TABS: 150.0000 mg | ORAL_TABLET | Freq: Once | ORAL | 1 refills | Status: AC

## 2020-10-21 NOTE — Telephone Encounter (Signed)
RX sent in

## 2020-10-29 ENCOUNTER — Ambulatory Visit: Payer: Medicaid Other | Admitting: Obstetrics and Gynecology

## 2020-11-05 ENCOUNTER — Ambulatory Visit: Payer: Medicaid Other | Admitting: Obstetrics and Gynecology

## 2020-11-05 ENCOUNTER — Telehealth: Payer: Self-pay

## 2020-11-05 NOTE — Telephone Encounter (Signed)
Pt called asking if amoxacillin and ibuprofen were safe to take while breastfeeding. Advised pt both were safe to take while breast feeding, Dr. Vergie Living confirmed. Pt verbalizes understanding.

## 2020-11-09 ENCOUNTER — Other Ambulatory Visit: Payer: Medicaid Other

## 2020-11-10 ENCOUNTER — Other Ambulatory Visit: Payer: Medicaid Other

## 2020-11-13 ENCOUNTER — Other Ambulatory Visit: Payer: Medicaid Other

## 2020-11-17 ENCOUNTER — Telehealth: Payer: Self-pay

## 2020-11-17 NOTE — Telephone Encounter (Signed)
Pt called stating she took benadryl last night and breast fed this morning for about a minute before remembering. Advised pt baby will be okay but do not continue taking benadryl if planning to breast feed. Informed pt if she had anymore concerns with baby to call pediatrician. Pt verbalizes understanding.

## 2020-11-19 ENCOUNTER — Other Ambulatory Visit: Payer: Medicaid Other

## 2020-11-25 ENCOUNTER — Other Ambulatory Visit: Payer: Self-pay

## 2020-11-25 ENCOUNTER — Telehealth (INDEPENDENT_AMBULATORY_CARE_PROVIDER_SITE_OTHER): Payer: Medicaid Other | Admitting: Advanced Practice Midwife

## 2020-11-25 NOTE — Progress Notes (Signed)
I connected with Laura Hernandez  on 11/25/20 at  2:10 PM EST by: Mychart video and verified that I am speaking with the correct person using two identifiers.  Patient is located at home and provider is located at [employer] for [employer] at [facility] .     The purpose of this virtual visit is to provide medical care while limiting exposure to the novel coronavirus. I discussed the limitations, risks, security and privacy concerns of performing an evaluation and management service by Clayton Bibles, CNM and the availability of in person appointments. I also discussed with the patient that there may be a patient responsible charge related to this service. By engaging in this virtual visit, you consent to the provision of healthcare.  Additionally, you authorize for your insurance to be billed for the services provided during this visit.  The patient expressed understanding and agreed to proceed.  The following staff members participated in the virtual visit:  Scheryl Marten, RN and Clayton Bibles, CNM  Post Partum Visit Note Subjective:   Laura Hernandez is a 30 y.o. G40P1011 female being evaluated for postpartum followup.  She is 8 weeks postpartum following a primary cesarean section at  37.6 gestational weeks.  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    Postpartum course has been uncomplicated. Baby is doing well. Baby is feeding by both breast and bottle - Enfamil neuro pro. Bleeding no bleeding. Bowel function is normal. Bladder function is normal. Patient is not sexually active. Contraception method is oral progesterone-only contraceptive. Postpartum depression screening: negative.   The pregnancy intention screening data noted above was reviewed. Potential methods of contraception were discussed. The patient elected to proceed with Oral Contraceptive.   The following portions of the patient's history were reviewed and updated as appropriate: allergies, current medications, past family history, past medical history, past social history, past surgical history and problem list.  Review of Systems A comprehensive review of systems was negative.   Objective:  There were no vitals filed for this visit. Self-Obtained       Assessment:    Normal postpartum exam. Some sensitivity along incision. Advised massage with coconut oil or hypoallergenic lotion  Plan:  Essential components of care per ACOG recommendations:  1.  Mood and well being: Patient with negative depression screening today. Reviewed local resources for support.  - Patient does not use tobacco.   2. Infant care and feeding:  -Patient currently breastmilk feeding? Yes  Reviewed importance of draining breast regularly to support lactation. -Social determinants of health (SDOH) reviewed in EPIC. No concerns  3. Sexuality, contraception and birth spacing - Patient does not want a pregnancy in the next year.  4. Sleep and fatigue -Encouraged family/partner/community support of 4 hrs of uninterrupted sleep to help with mood and fatigue  5. Physical Recovery  - Discussed patients delivery and complications - Patient has urinary incontinence? No  - Patient is safe to resume physical and  sexual activity  6.  Health Maintenance -  Last pap smear done 2015 and was normal with negative HPV.  7. Needs postpartum GTT and Pap at patient's earliest convenience  6 minutes of non-face-to-face time spent with the patient

## 2020-12-21 ENCOUNTER — Telehealth: Payer: Self-pay | Admitting: Obstetrics and Gynecology

## 2020-12-21 NOTE — Telephone Encounter (Signed)
Patient called this afternoon requesting a letter written by Dr.Cherry regarding her care a year ago, pt stated a court session coming up about loosing her job. I made patient aware I would pass this message along to the appropriate hands. Pt states lost her job even tho she had FMLA paperwork completed

## 2020-12-22 NOTE — Telephone Encounter (Signed)
Pt is being denied unemployment. She would like you to write her a letter why you placed her out of work during her pregnancy.   According to her, her employer is saying she left work without a medical reason.   Pt aware you are in clinic and it may be several days before she gets a response.

## 2020-12-23 ENCOUNTER — Encounter: Payer: Self-pay | Admitting: Obstetrics and Gynecology

## 2020-12-24 NOTE — Telephone Encounter (Signed)
Letter placed on your desk.   Dr. Valentino Saxon

## 2020-12-25 ENCOUNTER — Telehealth: Payer: Self-pay | Admitting: Obstetrics and Gynecology

## 2020-12-25 NOTE — Telephone Encounter (Signed)
Patient called stating that she can not see the letter from doctor that was suppose to be in her mychart- also that she was told letter would be up front- states her son is sick and can not come today will try to come pick up letter tomorrow.

## 2020-12-25 NOTE — Telephone Encounter (Signed)
Noted  

## 2020-12-28 ENCOUNTER — Other Ambulatory Visit: Payer: Self-pay | Admitting: *Deleted

## 2020-12-28 MED ORDER — FLUCONAZOLE 150 MG PO TABS: 150.0000 mg | ORAL_TABLET | Freq: Once | ORAL | 1 refills | Status: AC

## 2021-03-11 ENCOUNTER — Ambulatory Visit: Payer: Medicaid Other | Admitting: Obstetrics & Gynecology

## 2021-03-17 ENCOUNTER — Encounter: Payer: Self-pay | Admitting: Advanced Practice Midwife

## 2021-03-17 ENCOUNTER — Ambulatory Visit (INDEPENDENT_AMBULATORY_CARE_PROVIDER_SITE_OTHER): Payer: Medicaid Other | Admitting: Advanced Practice Midwife

## 2021-03-17 ENCOUNTER — Other Ambulatory Visit (HOSPITAL_COMMUNITY)
Admission: RE | Admit: 2021-03-17 | Discharge: 2021-03-17 | Disposition: A | Discharge status: Home / Self Care | Payer: Medicaid Other | Source: Ambulatory Visit | Attending: Obstetrics & Gynecology | Admitting: Obstetrics & Gynecology

## 2021-03-17 ENCOUNTER — Other Ambulatory Visit: Payer: Self-pay

## 2021-03-17 VITALS — BP 132/86 | HR 94

## 2021-03-17 DIAGNOSIS — Z7251 High risk heterosexual behavior: Secondary | ICD-10-CM | POA: Diagnosis not present

## 2021-03-17 DIAGNOSIS — Z0000 Encounter for general adult medical examination without abnormal findings: Secondary | ICD-10-CM

## 2021-03-17 DIAGNOSIS — R1030 Lower abdominal pain, unspecified: Secondary | ICD-10-CM

## 2021-03-17 DIAGNOSIS — Z01419 Encounter for gynecological examination (general) (routine) without abnormal findings: Secondary | ICD-10-CM

## 2021-03-17 LAB — POCT URINALYSIS DIPSTICK
Blood, UA: NEGATIVE
Leukocytes, UA: NEGATIVE
Nitrite, UA: NEGATIVE

## 2021-03-17 LAB — POCT URINE PREGNANCY: Preg Test, Ur: NEGATIVE

## 2021-03-17 NOTE — Progress Notes (Signed)
GYNECOLOGY ANNUAL PREVENTATIVE CARE ENCOUNTER NOTE  History:     Laura Hernandez is a 30 y.o. G36P1011 female here for a routine annual gynecologic exam.  Current complaints: irregular but recurrent periumbilical abdominal pain.   Denies abnormal vaginal bleeding, discharge, pelvic pain, problems with intercourse or other gynecologic concerns.    Patient endorses prolonged period of abstinence which ended when she had unprotected sex 03/05/2021 She is very concerned for pregnancy, endorses LMP of 02/25/2021. She has taken two home pregnancy tests, both with negative results   Gynecologic History No LMP recorded. Contraception: none Last Pap: Patient endorses normal pap with negative HPV during her 2021 pregnancy. Not visible in chart, not addressed in prenatal notes.  Obstetric History OB History  Gravida Para Term Preterm AB Living  2 1 1   1 1   SAB IAB Ectopic Multiple Live Births  1     0 1    # Outcome Date GA Lbr Len/2nd Weight Sex Delivery Anes PTL Lv  2 Term 09/21/20 [redacted]w[redacted]d  6 lb 10.7 oz (3.025 kg) M CS-LTranv EPI  LIV  1 SAB 07/2013            Past Medical History:  Diagnosis Date  . Bronchitis   . Chlamydia   . Gestational diabetes   . History of gonorrhea 09/18/2013   2012    . History of trichomonal vaginitis 09/18/2013   2012    . Migraines 2012  . Trichomonas     Past Surgical History:  Procedure Laterality Date  . CESAREAN SECTION N/A 09/21/2020   Procedure: CESAREAN SECTION;  Surgeon: 09/23/2020, MD;  Location: [facility];  Service: Obstetrics;  Laterality: N/A;  . NO PAST SURGERIES      Current Outpatient Medications on File Prior to Visit  Medication Sig Dispense Refill  . acetaminophen (TYLENOL) 500 MG tablet Take 1,000 mg by mouth 2 (two) times daily as needed for pain.  (Patient not taking: No sig reported)    . albuterol (PROVENTIL HFA;VENTOLIN HFA) 108 (90 Base) MCG/ACT inhaler Inhale 1-2 puffs into the lungs every 6 (six) hours as needed  for wheezing or shortness of breath. (Patient not taking: No sig reported) 1 Inhaler 0  . amLODipine (NORVASC) 5 MG tablet TAKE 1 TABLET (5 MG TOTAL) BY MOUTH DAILY. (Patient not taking: Reported on 11/25/2020) 30 tablet 11  . coconut oil OIL Apply 1 application topically as needed. (Patient not taking: Reported on 11/25/2020)  0  . dicloxacillin (DYNAPEN) 500 MG capsule Take 1 capsule (500 mg total) by mouth 4 (four) times daily. (Patient not taking: No sig reported) 40 capsule 0  . ferrous sulfate 325 (65 FE) MG tablet TAKE 1 TABLET (325 MG TOTAL) BY MOUTH EVERY OTHER DAY. (Patient not taking: No sig reported) 30 tablet 5  . ibuprofen (ADVIL) 600 MG tablet Take 1 tablet (600 mg total) by mouth every 6 (six) hours as needed. (Patient not taking: Reported on 11/25/2020) 30 tablet 0  . Magnesium Malate 1250 (141.7 Mg) MG TABS Take 1 tablet by mouth daily. (Patient not taking: Reported on 11/25/2020) 30 tablet 0  . norethindrone (MICRONOR) 0.35 MG tablet TAKE 1 TABLET (0.35 MG TOTAL) BY MOUTH DAILY. 28 tablet 11  . oxyCODONE (OXY IR/ROXICODONE) 5 MG immediate release tablet TAKE 1-2 TABLETS (5-10 MG TOTAL) BY MOUTH EVERY 4 (FOUR) HOURS AS NEEDED FOR MODERATE PAIN. (Patient not taking: Reported on 11/25/2020) 21 tablet 0  . oxyCODONE-acetaminophen (PERCOCET/ROXICET) 5-325 MG  tablet Take 1 tablet by mouth every 6 (six) hours as needed. (Patient not taking: Reported on 11/25/2020) 5 tablet 0  . Prenatal Vit-Fe Fumarate-FA (PRENATAL VITAMIN PO) Take by mouth.    . vitamin C (ASCORBIC ACID) 250 MG tablet Take 1 tablet (250 mg total) by mouth every other day. Take with iron. (Patient not taking: Reported on 11/25/2020)     No current facility-administered medications on file prior to visit.    Allergies  Allergen Reactions  . Latex Itching and Rash    Social History:  reports that she has never smoked. She has never used smokeless tobacco. She reports that she does not drink alcohol and does not use drugs.  Family  History  Problem Relation Age of Onset  . Asthma Mother   . Arthritis Mother   . Diabetes Mother   . Heart disease Mother   . Hypertension Mother   . Stroke Mother   . Kidney disease Mother   . Vision loss Mother   . Diabetes Maternal Grandmother   . Hypertension Maternal Grandmother     The following portions of the patient's history were reviewed and updated as appropriate: allergies, current medications, past family history, past medical history, past social history, past surgical history and problem list.  Review of Systems Pertinent items noted in HPI and remainder of comprehensive ROS otherwise negative.  Physical Exam:  There were no vitals taken for this visit. CONSTITUTIONAL: Well-developed, well-nourished female in no acute distress.  HENT:  Normocephalic, atraumatic, External right and left ear normal.  EYES: Conjunctivae and EOM are normal. Pupils are equal, round, and reactive to light. No scleral icterus.  NECK: Normal range of motion, supple, no masses.  Normal thyroid.  SKIN: Skin is warm and dry. No rash noted. Not diaphoretic. No erythema. No pallor. MUSCULOSKELETAL: Normal range of motion. No tenderness.  No cyanosis, clubbing, or edema. NEUROLOGIC: Alert and oriented to person, place, and time. Normal reflexes, muscle tone coordination.  PSYCHIATRIC: Normal mood and affect. Normal behavior. Normal judgment and thought content. CARDIOVASCULAR: Normal heart rate noted, regular rhythm RESPIRATORY: Clear to auscultation bilaterally. Effort and breath sounds normal, no problems with respiration noted. BREASTS: Symmetric in size. No masses, tenderness, skin changes, nipple drainage, or lymphadenopathy bilaterally. Performed in the presence of a chaperone. ABDOMEN: Soft, no distention noted.  No tenderness, rebound or guarding.  PELVIC: Declined by patient   Assessment and Plan:    1.  Unprotected sex - Negative UPT in office - Declines pelvic, prefers self swab -  Beta hCG quant (ref lab)  2. Well woman exam without gynecological exam - No abnormal findings  3. Lower abdominal pain - Initiate treatment with 600 mg Motrin q 6 hours   Routine preventative health maintenance measures emphasized. Please refer to After Visit Summary for other counseling recommendations.      Clayton Bibles, MSN, CNM Certified Nurse Midwife, [facility] for [employer], [facility] 03/17/21 3:19 PM

## 2021-03-17 NOTE — Patient Instructions (Signed)
Preventive Care 21-30 Years Old, Female Preventive care refers to lifestyle choices and visits with your health care provider that can promote health and wellness. This includes:  A yearly physical exam. This is also called an annual wellness visit.  Regular dental and eye exams.  Immunizations.  Screening for certain conditions.  Healthy lifestyle choices, such as: ? Eating a healthy diet. ? Getting regular exercise. ? Not using drugs or products that contain nicotine and tobacco. ? Limiting alcohol use. What can I expect for my preventive care visit? Physical exam Your health care provider may check your:  Height and weight. These may be used to calculate your BMI (body mass index). BMI is a measurement that tells if you are at a healthy weight.  Heart rate and blood pressure.  Body temperature.  Skin for abnormal spots. Counseling Your health care provider may ask you questions about your:  Past medical problems.  Family's medical history.  Alcohol, tobacco, and drug use.  Emotional well-being.  Home life and relationship well-being.  Sexual activity.  Diet, exercise, and sleep habits.  Work and work environment.  Access to firearms.  Method of birth control.  Menstrual cycle.  Pregnancy history. What immunizations do I need? Vaccines are usually given at various ages, according to a schedule. Your health care provider will recommend vaccines for you based on your age, medical history, and lifestyle or other factors, such as travel or where you work.   What tests do I need? Blood tests  Lipid and cholesterol levels. These may be checked every 5 years starting at age 20.  Hepatitis C test.  Hepatitis B test. Screening  Diabetes screening. This is done by checking your blood sugar (glucose) after you have not eaten for a while (fasting).  STD (sexually transmitted disease) testing, if you are at risk.  BRCA-related cancer screening. This may be  done if you have a family history of breast, ovarian, tubal, or peritoneal cancers.  Pelvic exam and Pap test. This may be done every 3 years starting at age 21. Starting at age 30, this may be done every 5 years if you have a Pap test in combination with an HPV test. Talk with your health care provider about your test results, treatment options, and if necessary, the need for more tests.   Follow these instructions at home: Eating and drinking  Eat a healthy diet that includes fresh fruits and vegetables, whole grains, lean protein, and low-fat dairy products.  Take vitamin and mineral supplements as recommended by your health care provider.  Do not drink alcohol if: ? Your health care provider tells you not to drink. ? You are pregnant, may be pregnant, or are planning to become pregnant.  If you drink alcohol: ? Limit how much you have to 0-1 drink a day. ? Be aware of how much alcohol is in your drink. In the [place] one drink equals one 12 oz bottle of beer (355 mL), one 5 oz glass of wine (148 mL), or one 1 oz glass of hard liquor (44 mL).   Lifestyle  Take daily care of your teeth and gums. Brush your teeth every morning and night with fluoride toothpaste. Floss one time each day.  Stay active. Exercise for at least 30 minutes 5 or more days each week.  Do not use any products that contain nicotine or tobacco, such as cigarettes, e-cigarettes, and chewing tobacco. If you need help quitting, ask your health care provider.  Do not   use drugs.  If you are sexually active, practice safe sex. Use a condom or other form of protection to prevent STIs (sexually transmitted infections).  If you do not wish to become pregnant, use a form of birth control. If you plan to become pregnant, see your health care provider for a prepregnancy visit.  Find healthy ways to cope with stress, such as: ? Meditation, yoga, or listening to music. ? Journaling. ? Talking to a trusted  person. ? Spending time with friends and family. Safety  Always wear your seat belt while driving or riding in a vehicle.  Do not drive: ? If you have been drinking alcohol. Do not ride with someone who has been drinking. ? When you are tired or distracted. ? While texting.  Wear a helmet and other protective equipment during sports activities.  If you have firearms in your house, make sure you follow all gun safety procedures.  Seek help if you have been physically or sexually abused. What's next?  Go to your health care provider once a year for an annual wellness visit.  Ask your health care provider how often you should have your eyes and teeth checked.  Stay up to date on all vaccines. This information is not intended to replace advice given to you by your health care provider. Make sure you discuss any questions you have with your health care provider. Document Revised: 06/07/2020 Document Reviewed: 06/21/2018 Elsevier Patient Education  2021 Elsevier Inc.  

## 2021-03-18 LAB — BETA HCG QUANT (REF LAB): hCG Quant: <1 m[IU]/mL

## 2021-03-19 ENCOUNTER — Institutional Professional Consult (permissible substitution): Payer: Medicaid Other | Admitting: Physician Assistant

## 2021-03-19 LAB — CERVICOVAGINAL ANCILLARY ONLY
Bacterial Vaginitis (gardnerella): POSITIVE — AB
Candida Glabrata: NEGATIVE
Candida Vaginitis: POSITIVE — AB
Chlamydia: NEGATIVE
Comment: NEGATIVE
Comment: NEGATIVE
Comment: NEGATIVE
Comment: NEGATIVE
Comment: NEGATIVE
Comment: NORMAL
Neisseria Gonorrhea: NEGATIVE
Trichomonas: NEGATIVE

## 2021-03-23 ENCOUNTER — Other Ambulatory Visit: Payer: Self-pay | Admitting: *Deleted

## 2021-03-23 MED ORDER — METRONIDAZOLE 500 MG PO TABS: 500.0000 mg | ORAL_TABLET | Freq: Two times a day (BID) | ORAL | 0 refills | Status: DC

## 2021-03-23 MED ORDER — FLUCONAZOLE 150 MG PO TABS: 150.0000 mg | ORAL_TABLET | Freq: Once | ORAL | 3 refills | Status: AC

## 2021-04-12 ENCOUNTER — Encounter: Payer: Self-pay | Admitting: Obstetrics and Gynecology

## 2021-04-12 ENCOUNTER — Other Ambulatory Visit: Payer: Self-pay

## 2021-04-12 ENCOUNTER — Telehealth (INDEPENDENT_AMBULATORY_CARE_PROVIDER_SITE_OTHER): Payer: Medicaid Other | Admitting: Obstetrics and Gynecology

## 2021-04-12 DIAGNOSIS — N939 Abnormal uterine and vaginal bleeding, unspecified: Secondary | ICD-10-CM | POA: Diagnosis not present

## 2021-04-12 DIAGNOSIS — R5383 Other fatigue: Secondary | ICD-10-CM

## 2021-04-12 NOTE — Progress Notes (Signed)
TELEHEALTH GYNECOLOGY VISIT ENCOUNTER NOTE  Provider location: [facility] for [facility]   Patient location: Home  I connected with Laura Hernandez on 04/12/21 at  2:30 PM EDT by telephone and verified that I am speaking with the correct person using two identifiers. Patient was unable to do MyChart audiovisual encounter due to technical difficulties, she tried several times.    I discussed the limitations, risks, security and privacy concerns of performing an evaluation and management service by telephone and the availability of in person appointments. I also discussed with the patient that there may be a patient responsible charge related to this service. The patient expressed understanding and agreed to proceed.   History:  Laura Hernandez is a 30 y.o. G22P1011 female being evaluated today for irregular periods, lethargy, headaches  Patient had an 08/2020 pLTCS for non reassuring fetal heart tones and she states that since delivery she's had irregular periods, where she may skip a month. She also says that her periods are 3 days instead of 5 days pre pregnancy. She was on micronor but stopped them this week. LMP was earlier this month and was 3 days.      Past Medical History:  Diagnosis Date   Bronchitis    Chlamydia    Gestational diabetes    History of gonorrhea 09/18/2013   2012     History of trichomonal vaginitis 09/18/2013   2012     Migraines 2012   Trichomonas    Past Surgical History:  Procedure Laterality Date   CESAREAN SECTION N/A 09/21/2020   Procedure: CESAREAN SECTION;  Surgeon: Calpella Bing, MD;  Location: [facility] LD ORS;  Service: Obstetrics;  Laterality: N/A;   The following portions of the patient's history were reviewed and updated as appropriate: allergies, current medications, past family history, past medical history, past social history, past surgical history and problem list.    Review of Systems:  Pertinent items noted in HPI and  remainder of comprehensive ROS otherwise negative.  Physical Exam:   General:  Alert, oriented and cooperative.   Mental Status: Normal mood and affect perceived. Normal judgment and thought content.  Physical exam deferred due to nature of the encounter  Labs and Imaging No results found for this or any previous visit (from the past 336 hour(s)). No results found.    Assessment and Plan:     1. Abnormal uterine bleeding (AUB) I told her that with her regular breastfeeding that it can be normal to have some AUB especially with micronor as sometimes it will or will not suppress ovulation b/c it works by thickening the cx mucus. I told her that since her milk supply is established that if she wants to continue on OCPs that I recommend switching to combined OCPs. She states she is not sexually active and wants to be hormone free for now. I told her to call us if she would like to start that or the patch (bmi needs to be <30) or the ring and the other options to call b/c she'll need a visit.   2. Other fatigue - TSH; Future - CBC; Future       I discussed the assessment and treatment plan with the patient. The patient was provided an opportunity to ask questions and all were answered. The patient agreed with the plan and demonstrated an understanding of the instructions.   The patient was advised to call back or seek an in-person evaluation/go to the ED  if the symptoms worsen or if the condition fails to improve as anticipated.  I provided 15 minutes of non-face-to-face time during this encounter.   Cabery Bing, MD Center for [employer], [facility]

## 2021-04-12 NOTE — Progress Notes (Signed)
Irregular cycles was on meds prior to being pregnant that helped her and wanted to see if she could go back in it again  Pt is not breast feeding currently

## 2021-04-13 ENCOUNTER — Ambulatory Visit: Payer: Medicaid Other | Attending: Internal Medicine

## 2021-04-13 DIAGNOSIS — Z20822 Contact with and (suspected) exposure to covid-19: Secondary | ICD-10-CM

## 2021-04-14 LAB — NOVEL CORONAVIRUS, NAA: SARS-CoV-2, NAA: NOT DETECTED

## 2021-04-14 LAB — SARS-COV-2, NAA 2 DAY TAT

## 2021-04-28 ENCOUNTER — Institutional Professional Consult (permissible substitution): Payer: Medicaid Other | Admitting: Physician Assistant

## 2021-05-11 ENCOUNTER — Other Ambulatory Visit: Payer: Self-pay | Admitting: *Deleted

## 2021-05-11 ENCOUNTER — Other Ambulatory Visit: Payer: Medicaid Other

## 2021-05-11 ENCOUNTER — Other Ambulatory Visit: Payer: Self-pay

## 2021-05-11 DIAGNOSIS — R718 Other abnormality of red blood cells: Secondary | ICD-10-CM

## 2021-05-11 DIAGNOSIS — N939 Abnormal uterine and vaginal bleeding, unspecified: Secondary | ICD-10-CM

## 2021-05-11 DIAGNOSIS — R5383 Other fatigue: Secondary | ICD-10-CM

## 2021-05-12 LAB — CBC
Hematocrit: 38 % (ref 34.0–46.6)
Hemoglobin: 11.4 g/dL (ref 11.1–15.9)
MCH: 20.4 pg — ABNORMAL LOW (ref 26.6–33.0)
MCHC: 30 g/dL — ABNORMAL LOW (ref 31.5–35.7)
MCV: 68 fL — ABNORMAL LOW (ref 79–97)
Platelets: 294 10*3/uL (ref 150–450)
RBC: 5.6 x10E6/uL — ABNORMAL HIGH (ref 3.77–5.28)
RDW: 16.6 % — ABNORMAL HIGH (ref 11.7–15.4)
WBC: 7.8 10*3/uL (ref 3.4–10.8)

## 2021-05-12 LAB — PROLACTIN: Prolactin: 11.7 ng/mL (ref 4.8–23.3)

## 2021-05-12 LAB — TSH: TSH: 1.28 u[IU]/mL (ref 0.450–4.500)

## 2021-05-21 ENCOUNTER — Ambulatory Visit (HOSPITAL_COMMUNITY)
Admission: EM | Admit: 2021-05-21 | Discharge: 2021-05-21 | Disposition: A | Discharge status: Home / Self Care | Payer: Medicaid Other | Attending: Family Medicine | Admitting: Family Medicine

## 2021-05-21 ENCOUNTER — Encounter (HOSPITAL_COMMUNITY): Payer: Self-pay | Admitting: Emergency Medicine

## 2021-05-21 ENCOUNTER — Other Ambulatory Visit: Payer: Self-pay

## 2021-05-21 DIAGNOSIS — Z20822 Contact with and (suspected) exposure to covid-19: Secondary | ICD-10-CM | POA: Insufficient documentation

## 2021-05-21 DIAGNOSIS — R1030 Lower abdominal pain, unspecified: Secondary | ICD-10-CM | POA: Diagnosis present

## 2021-05-21 DIAGNOSIS — R300 Dysuria: Secondary | ICD-10-CM | POA: Diagnosis not present

## 2021-05-21 DIAGNOSIS — Z79899 Other long term (current) drug therapy: Secondary | ICD-10-CM | POA: Insufficient documentation

## 2021-05-21 DIAGNOSIS — Z791 Long term (current) use of non-steroidal anti-inflammatories (NSAID): Secondary | ICD-10-CM | POA: Diagnosis not present

## 2021-05-21 DIAGNOSIS — R110 Nausea: Secondary | ICD-10-CM | POA: Insufficient documentation

## 2021-05-21 DIAGNOSIS — R52 Pain, unspecified: Secondary | ICD-10-CM | POA: Diagnosis not present

## 2021-05-21 LAB — POCT URINALYSIS DIPSTICK, ED / UC
Bilirubin Urine: NEGATIVE
Glucose, UA: NEGATIVE mg/dL
Hgb urine dipstick: NEGATIVE
Ketones, ur: NEGATIVE mg/dL
Nitrite: NEGATIVE
Protein, ur: NEGATIVE mg/dL
Specific Gravity, Urine: 1.025 (ref 1.005–1.030)
Urobilinogen, UA: 0.2 mg/dL (ref 0.0–1.0)
pH: 6 (ref 5.0–8.0)

## 2021-05-21 LAB — SARS CORONAVIRUS 2 (TAT 6-24 HRS): SARS Coronavirus 2: NEGATIVE

## 2021-05-21 LAB — POC INFLUENZA A AND B ANTIGEN (URGENT CARE ONLY)
INFLUENZA A ANTIGEN, POC: NEGATIVE
INFLUENZA B ANTIGEN, POC: NEGATIVE

## 2021-05-21 LAB — POC URINE PREG, ED: Preg Test, Ur: NEGATIVE

## 2021-05-21 MED ORDER — IBUPROFEN 800 MG PO TABS: 800.0000 mg | ORAL_TABLET | Freq: Three times a day (TID) | ORAL | 0 refills | Status: DC

## 2021-05-21 MED ORDER — CYCLOBENZAPRINE HCL 5 MG PO TABS: 5.0000 mg | ORAL_TABLET | Freq: Three times a day (TID) | ORAL | 0 refills | Status: DC | PRN

## 2021-05-21 NOTE — ED Triage Notes (Addendum)
Abdominal pain she compares to intense contractions starting two days ago. Says she just finished her period on 7/25. States it radiates through to her back. Also c/o nausea and ear pain. Denies dysuria, recent sexual intercourse, new vaginal discharge. Reports normal BM

## 2021-05-22 NOTE — ED Provider Notes (Signed)
[facility]    CSN: [id number] Arrival date & time: 05/21/21  1320      History   Chief Complaint Chief Complaint  Patient presents with   Abdominal Pain    HPI Laura Hernandez is a 30 y.o. female.   Patient presenting today with several day history of significant lower abdominal cramping, body aches following ending her menstrual cycle 2 days prior. Also having some nausea, dysuria, vaginal discharge. Denies fever, chills, bowel changes, vomiting, vaginal bleeding, new medications, recent travel, sick contacts.      Past Medical History:  Diagnosis Date   Bronchitis    Chlamydia    Gestational diabetes    History of gonorrhea 09/18/2013   2012     History of trichomonal vaginitis 09/18/2013   2012     Migraines 2012   Trichomonas     Patient Active Problem List   Diagnosis Date Noted   Cesarean delivery delivered 09/21/2020   GDM, class A2 09/20/2020   Positive GBS test 09/20/2020   Sciatic leg pain 09/09/2020   Vaginal bleeding in pregnancy 09/09/2020   BMI 40.0-44.9, adult (HCC) 08/25/2020   Obesity in pregnancy 08/25/2020   Pruritus 08/25/2020   Gestational diabetes mellitus (GDM) in third trimester 08/04/2020   Iron deficiency anemia 08/04/2020   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 08/04/2020   Situational mixed anxiety and depressive disorder 07/01/2020   Back pain affecting pregnancy, antepartum 07/01/2020   Trichomonas infection 06/05/2020   Urinary tract colonization by group B Streptococcus affecting pregnancy 03/31/2020   Supervision of high risk pregnancy, antepartum 12/06/2013   HSV-2 (herpes simplex virus 2) infection 09/18/2013    Past Surgical History:  Procedure Laterality Date   CESAREAN SECTION N/A 09/21/2020   Procedure: CESAREAN SECTION;  Surgeon: Sugden Bing, MD;  Location: [facility];  Service: Obstetrics;  Laterality: N/A;    OB History     Gravida  2   Para  1   Term  1   Preterm       AB  1   Living  1      SAB  1   IAB      Ectopic      Multiple  0   Live Births  1            Home Medications    Prior to Admission medications   Medication Sig Start Date End Date Taking? Authorizing Provider  cyclobenzaprine (FLEXERIL) 5 MG tablet Take 1 tablet (5 mg total) by mouth 3 (three) times daily as needed for muscle spasms. Do not drink alcohol or drive while taking this medication. May cause drowsiness 05/21/21  Yes Particia Nearing, PA-C  ibuprofen (ADVIL) 800 MG tablet Take 1 tablet (800 mg total) by mouth 3 (three) times daily. 05/21/21  Yes Particia Nearing, PA-C  acetaminophen (TYLENOL) 500 MG tablet Take 1,000 mg by mouth 2 (two) times daily as needed for pain.  Patient not taking: No sig reported    [provider]  albuterol (PROVENTIL HFA;VENTOLIN HFA) 108 (90 Base) MCG/ACT inhaler Inhale 1-2 puffs into the lungs every 6 (six) hours as needed for wheezing or shortness of breath. Patient not taking: No sig reported 12/04/18   Cathie Hoops, Amy V, PA-C  amLODipine (NORVASC) 5 MG tablet TAKE 1 TABLET (5 MG TOTAL) BY MOUTH DAILY. Patient not taking: Reported on 11/25/2020 09/24/20 09/24/21  [name], MD  coconut oil OIL Apply 1 application topically  as needed. Patient not taking: Reported on 11/25/2020 09/23/20   [name], MD  dicloxacillin (DYNAPEN) 500 MG capsule Take 1 capsule (500 mg total) by mouth 4 (four) times daily. Patient not taking: No sig reported 09/29/20   Reva Bores, MD  ferrous sulfate 325 (65 FE) MG tablet TAKE 1 TABLET (325 MG TOTAL) BY MOUTH EVERY OTHER DAY. Patient not taking: No sig reported 09/24/20 09/24/21  Gita Kudo, MD  ibuprofen (ADVIL) 600 MG tablet Take 1 tablet (600 mg total) by mouth every 6 (six) hours as needed. Patient not taking: Reported on 11/25/2020 10/07/20   [place] Bing, MD  Magnesium Malate 1250 (141.7 Mg) MG TABS Take 1 tablet by mouth daily. Patient not taking: Reported on  11/25/2020 10/06/20   [place] Bing, MD  metroNIDAZOLE (FLAGYL) 500 MG tablet Take 1 tablet (500 mg total) by mouth 2 (two) times daily. 03/23/21   Calvert Cantor, CNM  norethindrone (MICRONOR) 0.35 MG tablet TAKE 1 TABLET (0.35 MG TOTAL) BY MOUTH DAILY. Patient not taking: Reported on 04/12/2021 09/24/20 09/24/21  Gita Kudo, MD  oxyCODONE-acetaminophen (PERCOCET/ROXICET) 5-325 MG tablet Take 1 tablet by mouth every 6 (six) hours as needed. Patient not taking: Reported on 11/25/2020 10/02/20    Bing, MD  Prenatal Vit-Fe Fumarate-FA (PRENATAL VITAMIN PO) Take by mouth.    [provider]  vitamin C (ASCORBIC ACID) 250 MG tablet Take 1 tablet (250 mg total) by mouth every other day. Take with iron. Patient not taking: Reported on 11/25/2020 09/23/20   [name], MD    Family History Family History  Problem Relation Age of Onset   Asthma Mother    Arthritis Mother    Diabetes Mother    Heart disease Mother    Hypertension Mother    Stroke Mother    Kidney disease Mother    Vision loss Mother    Diabetes Maternal Grandmother    Hypertension Maternal Grandmother     Social History Social History   Tobacco Use   Smoking status: Never   Smokeless tobacco: Never  Vaping Use   Vaping Use: Never used  Substance Use Topics   Alcohol use: No   Drug use: No     Allergies   Latex   Review of Systems Review of Systems PER HPI    Physical Exam Triage Vital Signs ED Triage Vitals  Enc Vitals Group     BP 05/21/21 1444 113/78     Pulse Rate 05/21/21 1444 78     Resp 05/21/21 1444 14     Temp 05/21/21 1444 98.3 F (36.8 C)     Temp Source 05/21/21 1444 Oral     SpO2 05/21/21 1444 98 %     Weight --      Height --      Head Circumference --      Peak Flow --      Pain Score 05/21/21 1442 10     Pain Loc --      Pain Edu? --      Excl. in GC? --    No data found.  Updated Vital Signs BP 113/78 (BP Location: Right Arm)   Pulse 78    Temp 98.3 F (36.8 C) (Oral)   Resp 14   LMP 05/17/2021   SpO2 98%   Visual Acuity Right Eye Distance:   Left Eye Distance:   Bilateral Distance:    Right Eye Near:   Left Eye Near:  Bilateral Near:     Physical Exam Vitals and nursing note reviewed.  Constitutional:      Appearance: Normal appearance. She is not ill-appearing.  HENT:     Head: Atraumatic.  Eyes:     Extraocular Movements: Extraocular movements intact.     Conjunctiva/sclera: Conjunctivae normal.  Cardiovascular:     Rate and Rhythm: Normal rate and regular rhythm.     Heart sounds: Normal heart sounds.  Pulmonary:     Effort: Pulmonary effort is normal.     Breath sounds: Normal breath sounds.  Abdominal:     General: Bowel sounds are normal. There is no distension.     Palpations: Abdomen is soft.     Tenderness: There is abdominal tenderness. There is no right CVA tenderness, left CVA tenderness or guarding.     Comments: Mild suprapubic ttp, no distention or guarding  Musculoskeletal:        General: Normal range of motion.     Cervical back: Normal range of motion and neck supple.  Skin:    General: Skin is warm and dry.  Neurological:     Mental Status: She is alert and oriented to person, place, and time.  Psychiatric:        Mood and Affect: Mood normal.        Thought Content: Thought content normal.        Judgment: Judgment normal.     UC Treatments / Results  Labs (all labs ordered are listed, but only abnormal results are displayed) Labs Reviewed  POCT URINALYSIS DIPSTICK, ED / UC - Abnormal; Notable for the following components:      Result Value   Leukocytes,Ua TRACE (*)    All other components within normal limits  SARS CORONAVIRUS 2 (TAT 6-24 HRS)  POC URINE PREG, ED  POC INFLUENZA A AND B ANTIGEN (URGENT CARE ONLY)    EKG   Radiology No results found.  Procedures Procedures (including critical care time)  Medications Ordered in UC Medications - No data to  display  Initial Impression / Assessment and Plan / UC Course  I have reviewed the triage vital signs and the nursing notes.  Pertinent labs & imaging results that were available during my care of the patient were reviewed by me and considered in my medical decision making (see chart for details).     Unclear etiology of sxs, possibly viral. Vital signs and exam benign and reassuring, U/A with trace leuks but otherwise WNL, urine preg neg. Rapid flu neg, COVID pcr pending. Discussed flexeril for body aches, supportive OTC medications and home care. Strict return precautions reviewed for worsening sxs.   Final Clinical Impressions(s) / UC Diagnoses   Final diagnoses:  Lower abdominal pain  Generalized body aches   Discharge Instructions   None    ED Prescriptions     Medication Sig Dispense Auth. Provider   cyclobenzaprine (FLEXERIL) 5 MG tablet Take 1 tablet (5 mg total) by mouth 3 (three) times daily as needed for muscle spasms. Do not drink alcohol or drive while taking this medication. May cause drowsiness 15 tablet Particia Nearing, [place]   ibuprofen (ADVIL) 800 MG tablet Take 1 tablet (800 mg total) by mouth 3 (three) times daily. 30 tablet Particia Nearing, [place]      PDMP not reviewed this encounter.   Particia Nearing, [place] 05/25/21 2202

## 2021-05-29 ENCOUNTER — Encounter (HOSPITAL_COMMUNITY): Payer: Self-pay | Admitting: General Practice

## 2021-05-29 ENCOUNTER — Ambulatory Visit (INDEPENDENT_AMBULATORY_CARE_PROVIDER_SITE_OTHER): Payer: Medicaid Other

## 2021-05-29 ENCOUNTER — Ambulatory Visit (HOSPITAL_COMMUNITY)
Admission: EM | Admit: 2021-05-29 | Discharge: 2021-05-29 | Disposition: A | Discharge status: Home / Self Care | Payer: Medicaid Other | Attending: Emergency Medicine | Admitting: Emergency Medicine

## 2021-05-29 DIAGNOSIS — W19XXXA Unspecified fall, initial encounter: Secondary | ICD-10-CM

## 2021-05-29 DIAGNOSIS — S8392XA Sprain of unspecified site of left knee, initial encounter: Secondary | ICD-10-CM | POA: Diagnosis not present

## 2021-05-29 DIAGNOSIS — M25562 Pain in left knee: Secondary | ICD-10-CM | POA: Diagnosis not present

## 2021-05-29 DIAGNOSIS — J302 Other seasonal allergic rhinitis: Secondary | ICD-10-CM

## 2021-05-29 DIAGNOSIS — S0993XA Unspecified injury of face, initial encounter: Secondary | ICD-10-CM

## 2021-05-29 NOTE — ED Triage Notes (Signed)
Addendum: patient has out of control allergies. And the inner lips is bursted

## 2021-05-29 NOTE — ED Triage Notes (Signed)
Achy knee pain, swollen, hard to walk on it, can bend it a little bit.

## 2021-05-29 NOTE — Discharge Instructions (Addendum)
Try Xyzal for your allergies.  You can take 1/2 to 1 pill at night.    Your lip is healing well and has no signs of infection.    Wear the knee brace for comfort.   You can take Tylenol and/or Ibuprofen as needed for pain.   Rest as much as possible Ice for 10-15 minutes every 4-6 hours as needed for pain and swelling Compression- use an ace bandage or splint for comfort Elevate above your hip/heart when sitting and laying down  Follow up with sports medicine or orthopedics if symptoms do not improve in the next few days.

## 2021-05-29 NOTE — ED Provider Notes (Signed)
[facility]    CSN: [id number] Arrival date & time: 05/29/21  1307      History   Chief Complaint Chief Complaint  Patient presents with   Knee Pain    HPI Laura Hernandez is a 30 y.o. female.   Patient here for evaluation of left knee pain following a fall yesterday.  Reports taking OTC medication and elevated leg but pain has not improved.  Reports pain is achy and constant and worse with movement.  Also reports allergies that are not relieved by OTC medications.  Reports having an appointment with a primary care provider but not for a few more weeks.  Reports trying Claritin, Zyrtec, Flonase, and Benadryl.  Reports difficulty sleeping at night due to symptoms.  Also with complaint of inner lip laceration.  Denies any fevers, chest pain, shortness of breath, N/V/D, numbness, tingling, weakness, abdominal pain, or headaches.    The history is provided by the patient.  Knee Pain  Past Medical History:  Diagnosis Date   Bronchitis    Chlamydia    Gestational diabetes    History of gonorrhea 09/18/2013   2012     History of trichomonal vaginitis 09/18/2013   2012     Migraines 2012   Trichomonas     Patient Active Problem List   Diagnosis Date Noted   Cesarean delivery delivered 09/21/2020   GDM, class A2 09/20/2020   Positive GBS test 09/20/2020   Sciatic leg pain 09/09/2020   Vaginal bleeding in pregnancy 09/09/2020   BMI 40.0-44.9, adult (HCC) 08/25/2020   Obesity in pregnancy 08/25/2020   Pruritus 08/25/2020   Gestational diabetes mellitus (GDM) in third trimester 08/04/2020   Iron deficiency anemia 08/04/2020   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 08/04/2020   Situational mixed anxiety and depressive disorder 07/01/2020   Back pain affecting pregnancy, antepartum 07/01/2020   Trichomonas infection 06/05/2020   Urinary tract colonization by group B Streptococcus affecting pregnancy 03/31/2020   Supervision of high risk  pregnancy, antepartum 12/06/2013   HSV-2 (herpes simplex virus 2) infection 09/18/2013    Past Surgical History:  Procedure Laterality Date   CESAREAN SECTION N/A 09/21/2020   Procedure: CESAREAN SECTION;  Surgeon: Dubberly Bing, MD;  Location: [facility];  Service: Obstetrics;  Laterality: N/A;    OB History     Gravida  2   Para  1   Term  1   Preterm      AB  1   Living  1      SAB  1   IAB      Ectopic      Multiple  0   Live Births  1            Home Medications    Prior to Admission medications   Medication Sig Start Date End Date Taking? Authorizing Provider  cyclobenzaprine (FLEXERIL) 5 MG tablet Take 1 tablet (5 mg total) by mouth 3 (three) times daily as needed for muscle spasms. Do not drink alcohol or drive while taking this medication. May cause drowsiness 05/21/21  Yes Particia Nearing, PA-C  ibuprofen (ADVIL) 800 MG tablet Take 1 tablet (800 mg total) by mouth 3 (three) times daily. 05/21/21  Yes Particia Nearing, PA-C  acetaminophen (TYLENOL) 500 MG tablet Take 1,000 mg by mouth 2 (two) times daily as needed for pain.  Patient not taking: No sig reported    [provider]  albuterol (PROVENTIL HFA;VENTOLIN HFA)  108 (90 Base) MCG/ACT inhaler Inhale 1-2 puffs into the lungs every 6 (six) hours as needed for wheezing or shortness of breath. Patient not taking: No sig reported 12/04/18   Cathie Hoops, Amy V, PA-C  amLODipine (NORVASC) 5 MG tablet TAKE 1 TABLET (5 MG TOTAL) BY MOUTH DAILY. Patient not taking: Reported on 11/25/2020 09/24/20 09/24/21  [name], MD  coconut oil OIL Apply 1 application topically as needed. Patient not taking: Reported on 11/25/2020 09/23/20   [name], MD  dicloxacillin (DYNAPEN) 500 MG capsule Take 1 capsule (500 mg total) by mouth 4 (four) times daily. Patient not taking: No sig reported 09/29/20   Reva Bores, MD  ferrous sulfate 325 (65 FE) MG tablet TAKE 1 TABLET (325 MG TOTAL) BY  MOUTH EVERY OTHER DAY. Patient not taking: No sig reported 09/24/20 09/24/21  Gita Kudo, MD  ibuprofen (ADVIL) 600 MG tablet Take 1 tablet (600 mg total) by mouth every 6 (six) hours as needed. Patient not taking: Reported on 11/25/2020 10/07/20   [place] Bing, MD  Magnesium Malate 1250 (141.7 Mg) MG TABS Take 1 tablet by mouth daily. Patient not taking: Reported on 11/25/2020 10/06/20   [place] Bing, MD  metroNIDAZOLE (FLAGYL) 500 MG tablet Take 1 tablet (500 mg total) by mouth 2 (two) times daily. 03/23/21   Calvert Cantor, CNM  norethindrone (MICRONOR) 0.35 MG tablet TAKE 1 TABLET (0.35 MG TOTAL) BY MOUTH DAILY. Patient not taking: Reported on 04/12/2021 09/24/20 09/24/21  Gita Kudo, MD  oxyCODONE-acetaminophen (PERCOCET/ROXICET) 5-325 MG tablet Take 1 tablet by mouth every 6 (six) hours as needed. Patient not taking: Reported on 11/25/2020 10/02/20   [place] Bing, MD  Prenatal Vit-Fe Fumarate-FA (PRENATAL VITAMIN PO) Take by mouth.    [provider]  vitamin C (ASCORBIC ACID) 250 MG tablet Take 1 tablet (250 mg total) by mouth every other day. Take with iron. Patient not taking: Reported on 11/25/2020 09/23/20   [name], MD    Family History Family History  Problem Relation Age of Onset   Asthma Mother    Arthritis Mother    Diabetes Mother    Heart disease Mother    Hypertension Mother    Stroke Mother    Kidney disease Mother    Vision loss Mother    Diabetes Maternal Grandmother    Hypertension Maternal Grandmother     Social History Social History   Tobacco Use   Smoking status: Never   Smokeless tobacco: Never  Vaping Use   Vaping Use: Never used  Substance Use Topics   Alcohol use: No   Drug use: No     Allergies   Latex   Review of Systems Review of Systems  HENT:  Positive for congestion, mouth sores and sneezing.   Respiratory:  Positive for cough.   Musculoskeletal:  Positive for arthralgias and joint swelling.   All other systems reviewed and are negative.   Physical Exam Triage Vital Signs ED Triage Vitals  Enc Vitals Group     BP 05/29/21 1454 117/69     Pulse Rate 05/29/21 1454 75     Resp --      Temp 05/29/21 1454 99 F (37.2 C)     Temp Source 05/29/21 1454 Oral     SpO2 05/29/21 1454 100 %     Weight --      Height --      Head Circumference --      Peak  Flow --      Pain Score 05/29/21 1455 8     Pain Loc --      Pain Edu? --      Excl. in GC? --    No data found.  Updated Vital Signs BP 117/69 (BP Location: Right Arm)   Pulse 75   Temp 99 F (37.2 C) (Oral)   LMP 05/17/2021   SpO2 100%   Visual Acuity Right Eye Distance:   Left Eye Distance:   Bilateral Distance:    Right Eye Near:   Left Eye Near:    Bilateral Near:     Physical Exam Vitals and nursing note reviewed.  Constitutional:      General: She is not in acute distress.    Appearance: Normal appearance. She is not ill-appearing, toxic-appearing or diaphoretic.  HENT:     Head: Normocephalic and atraumatic.     Nose: Congestion present. No rhinorrhea.     Mouth/Throat:     Comments: Healing laceration to inner lower and upper lip with no signs of infection Eyes:     Conjunctiva/sclera: Conjunctivae normal.  Cardiovascular:     Rate and Rhythm: Normal rate.     Pulses: Normal pulses.  Pulmonary:     Effort: Pulmonary effort is normal.  Abdominal:     General: Abdomen is flat.  Musculoskeletal:     Cervical back: Normal range of motion.     Left knee: Bony tenderness present. No effusion, erythema or crepitus. Decreased range of motion. Tenderness (diffuse tenderness) present. Normal alignment, normal meniscus and normal patellar mobility.  Skin:    General: Skin is warm and dry.  Neurological:     General: No focal deficit present.     Mental Status: She is alert and oriented to person, place, and time.  Psychiatric:        Mood and Affect: Mood normal.     UC Treatments / Results   Labs (all labs ordered are listed, but only abnormal results are displayed) Labs Reviewed - No data to display  EKG   Radiology DG Knee Complete 4 Views Left  Result Date: 05/29/2021 CLINICAL DATA:  Fall, left knee pain EXAM: LEFT KNEE - COMPLETE 4+ VIEW COMPARISON:  None. FINDINGS: No evidence of fracture, dislocation, or joint effusion. No evidence of arthropathy or other focal bone abnormality. Soft tissues are unremarkable. IMPRESSION: Negative. Electronically Signed   By: Helyn Numbers MD   On: 05/29/2021 15:40    Procedures Procedures (including critical care time)  Medications Ordered in UC Medications - No data to display  Initial Impression / Assessment and Plan / UC Course  I have reviewed the triage vital signs and the nursing notes.  Pertinent labs & imaging results that were available during my care of the patient were reviewed by me and considered in my medical decision making (see chart for details).    Assessment negative for red flags or concerns.  Xray with no acute bony abnormalities.  Knee brace applied in office and wear for comfort.  Recommend rest, ice, compression, and elevation.  May take Tylenol and Ibuprofen as needed for pain. Follow up with orthopedics or sports medicine if symptoms do not improve.  Seasonal allergies.  Recommend Xyzal allergy medication at night.  Follow up with primary care for further evaluation and long-term management of symptoms.   Lip injury with no signs of infection and healing well on its own.  Final Clinical Impressions(s) / UC Diagnoses  Final diagnoses:  Sprain of left knee, unspecified ligament, initial encounter  Seasonal allergies  Injury of skin of lip, initial encounter     Discharge Instructions      Try Xyzal for your allergies.  You can take 1/2 to 1 pill at night.    Your lip is healing well and has no signs of infection.    Wear the knee brace for comfort.   You can take Tylenol and/or Ibuprofen as  needed for pain.   Rest as much as possible Ice for 10-15 minutes every 4-6 hours as needed for pain and swelling Compression- use an ace bandage or splint for comfort Elevate above your hip/heart when sitting and laying down  Follow up with sports medicine or orthopedics if symptoms do not improve in the next few days.        ED Prescriptions   None    PDMP not reviewed this encounter.   Ivette LoyalSmith, Tensley Wery R, NP 05/29/21 1556

## 2021-06-15 DIAGNOSIS — K219 Gastro-esophageal reflux disease without esophagitis: Secondary | ICD-10-CM | POA: Insufficient documentation

## 2021-06-15 DIAGNOSIS — E782 Mixed hyperlipidemia: Secondary | ICD-10-CM | POA: Insufficient documentation

## 2021-06-17 ENCOUNTER — Other Ambulatory Visit: Payer: Self-pay | Admitting: Nurse Practitioner

## 2021-06-17 DIAGNOSIS — K829 Disease of gallbladder, unspecified: Secondary | ICD-10-CM

## 2021-06-17 DIAGNOSIS — R109 Unspecified abdominal pain: Secondary | ICD-10-CM

## 2021-07-09 ENCOUNTER — Telehealth: Payer: Self-pay | Admitting: *Deleted

## 2021-07-09 MED ORDER — FLUCONAZOLE 150 MG PO TABS: 150.0000 mg | ORAL_TABLET | Freq: Once | ORAL | 3 refills | Status: AC

## 2021-07-09 NOTE — Telephone Encounter (Signed)
-----   Message from Izora Gala sent at 07/08/2021 11:19 AM EDT ----- Regarding: yeast infection Can you call this pt in something for a yeast infection.

## 2021-07-12 ENCOUNTER — Other Ambulatory Visit: Payer: Medicaid Other

## 2021-08-06 ENCOUNTER — Emergency Department (HOSPITAL_BASED_OUTPATIENT_CLINIC_OR_DEPARTMENT_OTHER)
Admission: EM | Admit: 2021-08-06 | Discharge: 2021-08-06 | Disposition: A | Discharge status: Home / Self Care | Payer: Medicaid Other | Attending: Emergency Medicine | Admitting: Emergency Medicine

## 2021-08-06 ENCOUNTER — Ambulatory Visit (HOSPITAL_COMMUNITY)
Admission: EM | Admit: 2021-08-06 | Discharge: 2021-08-06 | Disposition: A | Discharge status: Home / Self Care | Payer: Medicaid Other

## 2021-08-06 ENCOUNTER — Emergency Department (HOSPITAL_BASED_OUTPATIENT_CLINIC_OR_DEPARTMENT_OTHER): Payer: Medicaid Other | Admitting: Radiology

## 2021-08-06 ENCOUNTER — Encounter (HOSPITAL_BASED_OUTPATIENT_CLINIC_OR_DEPARTMENT_OTHER): Payer: Self-pay

## 2021-08-06 ENCOUNTER — Other Ambulatory Visit: Payer: Self-pay

## 2021-08-06 DIAGNOSIS — R0789 Other chest pain: Secondary | ICD-10-CM | POA: Diagnosis present

## 2021-08-06 DIAGNOSIS — N9489 Other specified conditions associated with female genital organs and menstrual cycle: Secondary | ICD-10-CM | POA: Diagnosis not present

## 2021-08-06 DIAGNOSIS — R0602 Shortness of breath: Secondary | ICD-10-CM | POA: Insufficient documentation

## 2021-08-06 DIAGNOSIS — Z20822 Contact with and (suspected) exposure to covid-19: Secondary | ICD-10-CM | POA: Insufficient documentation

## 2021-08-06 DIAGNOSIS — R059 Cough, unspecified: Secondary | ICD-10-CM | POA: Diagnosis not present

## 2021-08-06 LAB — HCG, SERUM, QUALITATIVE: Preg, Serum: NEGATIVE

## 2021-08-06 LAB — BASIC METABOLIC PANEL
Anion gap: 10 (ref 5–15)
BUN: 8 mg/dL (ref 6–20)
CO2: 26 mmol/L (ref 22–32)
Calcium: 9.3 mg/dL (ref 8.9–10.3)
Chloride: 102 mmol/L (ref 98–111)
Creatinine, Ser: 0.8 mg/dL (ref 0.44–1.00)
GFR, Estimated: >60 mL/min (ref >60)
Glucose, Bld: 109 mg/dL — ABNORMAL HIGH (ref 70–99)
Potassium: 3.6 mmol/L (ref 3.5–5.1)
Sodium: 138 mmol/L (ref 135–145)

## 2021-08-06 LAB — CBC
HCT: 39.8 % (ref 36.0–46.0)
Hemoglobin: 11.7 g/dL — ABNORMAL LOW (ref 12.0–15.0)
MCH: 20.3 pg — ABNORMAL LOW (ref 26.0–34.0)
MCHC: 29.4 g/dL — ABNORMAL LOW (ref 30.0–36.0)
MCV: 69.2 fL — ABNORMAL LOW (ref 80.0–100.0)
Platelets: 335 10*3/uL (ref 150–400)
RBC: 5.75 MIL/uL — ABNORMAL HIGH (ref 3.87–5.11)
RDW: 17.4 % — ABNORMAL HIGH (ref 11.5–15.5)
WBC: 9.5 10*3/uL (ref 4.0–10.5)
nRBC: 0 % (ref 0.0–0.2)

## 2021-08-06 LAB — D-DIMER, QUANTITATIVE: D-Dimer, Quant: 0.36 ug/mL-FEU (ref 0.00–0.50)

## 2021-08-06 LAB — RESP PANEL BY RT-PCR (FLU A&B, COVID) ARPGX2
Influenza A by PCR: NEGATIVE
Influenza B by PCR: NEGATIVE
SARS Coronavirus 2 by RT PCR: NEGATIVE

## 2021-08-06 LAB — TROPONIN I (HIGH SENSITIVITY): Troponin I (High Sensitivity): <2 ng/L (ref <18)

## 2021-08-06 MED ORDER — KETOROLAC TROMETHAMINE 30 MG/ML IJ SOLN
30.0000 mg | Freq: Once | INTRAMUSCULAR | Status: DC
  Filled 2021-08-06: qty 1

## 2021-08-06 MED ORDER — BENZONATATE 100 MG PO CAPS: 100.0000 mg | ORAL_CAPSULE | Freq: Three times a day (TID) | ORAL | 0 refills | Status: AC

## 2021-08-06 MED ORDER — AEROCHAMBER PLUS FLO-VU LARGE MISC
1.0000 | Freq: Once | Status: AC
  Administered 2021-08-06: 1
  Filled 2021-08-06: qty 1

## 2021-08-06 MED ORDER — ALBUTEROL SULFATE HFA 108 (90 BASE) MCG/ACT IN AERS
2.0000 | INHALATION_SPRAY | Freq: Once | RESPIRATORY_TRACT | Status: AC
  Administered 2021-08-06: 2 via RESPIRATORY_TRACT
  Filled 2021-08-06: qty 6.7

## 2021-08-06 NOTE — ED Notes (Signed)
Pt complains of chest head and back pain, all a 9 out of 10.  S-1, S-2 heard. Bilateral Lung sounds diminished

## 2021-08-06 NOTE — ED Notes (Signed)
Pt verbalizes understanding of discharge instructions. Opportunity for questioning and answers were provided. Armand removed by staff, pt discharged from ED to home. RT educated pt on inhaler use. Instructed to pick up Rx and f/u with PCP.

## 2021-08-06 NOTE — ED Notes (Signed)
Pt called upset about her care and her son being called a "monkey and heathen". This RN did NOT call her son that whatsoever. Another employee may have. Charge RN was notified about patient's care and that she was upset.

## 2021-08-06 NOTE — ED Provider Notes (Signed)
[facility] EMERGENCY DEPT Provider Note   CSN: [id number] Arrival date & time: 08/06/21  1652     History Chief Complaint  Patient presents with   Chest Pain   Shortness of Breath    Laura Hernandez is a 30 y.o. female.  HPI  30 year old female with a history of bronchitis, chlamydia, gestational diabetes, migraines, who presents to the emergency department today for evaluation of chest, shortness of breath and cough.  States she has had a cough for the last 3 weeks.  Denies hemoptysis.  She has some chest pain and back pain as well that is worse with her cough.  She has some intermittent fevers and congestion.  Denies any vomiting or diarrhea.  She states that she is taken several tests at home for COVID and 2 of them have been positive.  Past Medical History:  Diagnosis Date   Bronchitis    Chlamydia    Gestational diabetes    History of gonorrhea 09/18/2013   2012     History of trichomonal vaginitis 09/18/2013   2012     Migraines 2012   Trichomonas     Patient Active Problem List   Diagnosis Date Noted   Cesarean delivery delivered 09/21/2020   GDM, class A2 09/20/2020   Positive GBS test 09/20/2020   Sciatic leg pain 09/09/2020   Vaginal bleeding in pregnancy 09/09/2020   BMI 40.0-44.9, adult (HCC) 08/25/2020   Obesity in pregnancy 08/25/2020   Pruritus 08/25/2020   Gestational diabetes mellitus (GDM) in third trimester 08/04/2020   Iron deficiency anemia 08/04/2020   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 08/04/2020   Situational mixed anxiety and depressive disorder 07/01/2020   Back pain affecting pregnancy, antepartum 07/01/2020   Trichomonas infection 06/05/2020   Urinary tract colonization by group B Streptococcus affecting pregnancy 03/31/2020   Supervision of high risk pregnancy, antepartum 12/06/2013   HSV-2 (herpes simplex virus 2) infection 09/18/2013    Past Surgical History:  Procedure Laterality Date    CESAREAN SECTION N/A 09/21/2020   Procedure: CESAREAN SECTION;  Surgeon: Vermillion Bing, MD;  Location: [facility] LD ORS;  Service: Obstetrics;  Laterality: N/A;     OB History     Gravida  2   Para  1   Term  1   Preterm      AB  1   Living  1      SAB  1   IAB      Ectopic      Multiple  0   Live Births  1           Family History  Problem Relation Age of Onset   Asthma Mother    Arthritis Mother    Diabetes Mother    Heart disease Mother    Hypertension Mother    Stroke Mother    Kidney disease Mother    Vision loss Mother    Diabetes Maternal Grandmother    Hypertension Maternal Grandmother     Social History   Tobacco Use   Smoking status: Never   Smokeless tobacco: Never  Vaping Use   Vaping Use: Never used  Substance Use Topics   Alcohol use: No   Drug use: No    Home Medications Prior to Admission medications   Medication Sig Start Date End Date Taking? Authorizing Provider  benzonatate (TESSALON) 100 MG capsule Take 1 capsule (100 mg total) by mouth every 8 (eight) hours for 5 days. 08/06/21 08/11/21 Yes  Alwyn Cordner S, PA-C  acetaminophen (TYLENOL) 500 MG tablet Take 1,000 mg by mouth 2 (two) times daily as needed for pain.  Patient not taking: No sig reported    [provider]  albuterol (PROVENTIL HFA;VENTOLIN HFA) 108 (90 Base) MCG/ACT inhaler Inhale 1-2 puffs into the lungs every 6 (six) hours as needed for wheezing or shortness of breath. Patient not taking: No sig reported 12/04/18   Cathie Hoops, Amy V, PA-C  amLODipine (NORVASC) 5 MG tablet TAKE 1 TABLET (5 MG TOTAL) BY MOUTH DAILY. Patient not taking: Reported on 11/25/2020 09/24/20 09/24/21  [name], MD  coconut oil OIL Apply 1 application topically as needed. Patient not taking: Reported on 11/25/2020 09/23/20   [name], MD  cyclobenzaprine (FLEXERIL) 5 MG tablet Take 1 tablet (5 mg total) by mouth 3 (three) times daily as needed for muscle spasms. Do not  drink alcohol or drive while taking this medication. May cause drowsiness 05/21/21   Particia Nearing, PA-C  dicloxacillin (DYNAPEN) 500 MG capsule Take 1 capsule (500 mg total) by mouth 4 (four) times daily. Patient not taking: No sig reported 09/29/20   Reva Bores, MD  ferrous sulfate 325 (65 FE) MG tablet TAKE 1 TABLET (325 MG TOTAL) BY MOUTH EVERY OTHER DAY. Patient not taking: No sig reported 09/24/20 09/24/21  Gita Kudo, MD  ibuprofen (ADVIL) 600 MG tablet Take 1 tablet (600 mg total) by mouth every 6 (six) hours as needed. Patient not taking: Reported on 11/25/2020 10/07/20   [place] Bing, MD  ibuprofen (ADVIL) 800 MG tablet Take 1 tablet (800 mg total) by mouth 3 (three) times daily. 05/21/21   Particia Nearing, PA-C  Magnesium Malate 1250 (141.7 Mg) MG TABS Take 1 tablet by mouth daily. Patient not taking: Reported on 11/25/2020 10/06/20   [place] Bing, MD  metroNIDAZOLE (FLAGYL) 500 MG tablet Take 1 tablet (500 mg total) by mouth 2 (two) times daily. 03/23/21   Calvert Cantor, CNM  norethindrone (MICRONOR) 0.35 MG tablet TAKE 1 TABLET (0.35 MG TOTAL) BY MOUTH DAILY. Patient not taking: Reported on 04/12/2021 09/24/20 09/24/21  Gita Kudo, MD  oxyCODONE-acetaminophen (PERCOCET/ROXICET) 5-325 MG tablet Take 1 tablet by mouth every 6 (six) hours as needed. Patient not taking: Reported on 11/25/2020 10/02/20   [place] Bing, MD  Prenatal Vit-Fe Fumarate-FA (PRENATAL VITAMIN PO) Take by mouth.    [provider]  vitamin C (ASCORBIC ACID) 250 MG tablet Take 1 tablet (250 mg total) by mouth every other day. Take with iron. Patient not taking: Reported on 11/25/2020 09/23/20   [name], MD    Allergies    Latex  Review of Systems   Review of Systems  Constitutional:  Positive for fever.  HENT:  Negative for sore throat.   Eyes:  Negative for visual disturbance.  Respiratory:  Positive for cough and shortness of breath.   Cardiovascular:   Positive for chest pain.  Gastrointestinal:  Negative for abdominal pain, diarrhea and vomiting.  Genitourinary:  Negative for pelvic pain.  Musculoskeletal:  Negative for back pain.   Physical Exam Updated Vital Signs BP 139/72   Pulse 79   Temp 98.4 F (36.9 C)   Resp (!) 21   Ht 5' (1.524 m)   Wt 91.2 kg   LMP 07/19/2021   SpO2 100%   BMI 39.26 kg/m   Physical Exam Vitals and nursing note reviewed.  Constitutional:      General: She is  not in acute distress.    Appearance: She is well-developed.  HENT:     Head: Normocephalic and atraumatic.  Eyes:     Conjunctiva/sclera: Conjunctivae normal.  Cardiovascular:     Rate and Rhythm: Normal rate and regular rhythm.     Heart sounds: No murmur heard. Pulmonary:     Effort: Pulmonary effort is normal. No respiratory distress.     Breath sounds: Normal breath sounds. No decreased breath sounds, wheezing, rhonchi or rales.  Abdominal:     Palpations: Abdomen is soft.     Tenderness: There is no abdominal tenderness.  Musculoskeletal:     Cervical back: Neck supple.     Right lower leg: No tenderness. No edema.     Left lower leg: No tenderness. No edema.  Skin:    General: Skin is warm and dry.  Neurological:     Mental Status: She is alert.    ED Results / Procedures / Treatments   Labs (all labs ordered are listed, but only abnormal results are displayed) Labs Reviewed  BASIC METABOLIC PANEL - Abnormal; Notable for the following components:      Result Value   Glucose, Bld 109 (*)    All other components within normal limits  CBC - Abnormal; Notable for the following components:   RBC 5.75 (*)    Hemoglobin 11.7 (*)    MCV 69.2 (*)    MCH 20.3 (*)    MCHC 29.4 (*)    RDW 17.4 (*)    All other components within normal limits  RESP PANEL BY RT-PCR (FLU A&B, COVID) ARPGX2  D-DIMER, QUANTITATIVE  HCG, SERUM, QUALITATIVE  PREGNANCY, URINE  TROPONIN I (HIGH SENSITIVITY)    EKG EKG  Interpretation  Date/Time:  Friday August 06 2021 17:10:17 EDT Ventricular Rate:  88 PR Interval:  132 QRS Duration: 78 QT Interval:  354 QTC Calculation: 428 R Axis:   61 Text Interpretation: Normal sinus rhythm Normal ECG Confirmed by Gwyneth Sprout ([id number]) on 08/06/2021 6:53:18 PM  Radiology DG Chest 2 View  Result Date: 08/06/2021 CLINICAL DATA:  Chest, head and back pain. EXAM: CHEST - 2 VIEW COMPARISON:  August 14, 2008 FINDINGS: The heart size and mediastinal contours are within normal limits. Both lungs are clear. The visualized skeletal structures are unremarkable. IMPRESSION: No active cardiopulmonary disease. Electronically Signed   By: Aram Candela M.D.   On: 08/06/2021 18:41    Procedures Procedures   Medications Ordered in ED Medications  ketorolac (TORADOL) 30 MG/ML injection 30 mg (30 mg Intravenous Patient Refused/Not Given 08/06/21 1853)    ED Course  I have reviewed the triage vital signs and the nursing notes.  Pertinent labs & imaging results that were available during my care of the patient were reviewed by me and considered in my medical decision making (see chart for details).    MDM Rules/Calculators/A&P                          30 y/o female presenting for cough, chest pain and sob. She had two positive covid tests at home.   Reviewed/interpreted labs CBC unremarkable BMP reassuring Trop neg Dddimer neg  EKG wnl  Reviewed/interpreted imaging CXR neg  Pt given toradol in the ED. Her w/u here is completely negative and reassuring. Sxs are likely related to viral infection. Suspect chest and back pain are msk in nature and from frequent coughing. Advised tylenol, motrin. Will give rx for  tessalon as well. Advised on pcp f/u and strict return precautions. She voices understanding of the plan and reasons to return. All questions answered, pt stable for discharge  Final Clinical Impression(s) / ED Diagnoses Final diagnoses:  Atypical  chest pain  Cough, unspecified type    Rx / DC Orders ED Discharge Orders          Ordered    benzonatate (TESSALON) 100 MG capsule  Every 8 hours        08/06/21 2053             Karrie Meres, PA-C 08/06/21 2054    Gwyneth Sprout, MD 08/06/21 2333

## 2021-08-06 NOTE — Discharge Instructions (Addendum)
Take tessalon as directed  You may alternate taking Tylenol and Ibuprofen as needed for pain control. You may take 400-600 mg of ibuprofen every 6 hours and [id number] 1956 mg of Tylenol every 6 hours. Do not exceed 4000 mg of Tylenol daily as this can lead to liver damage. Also, make sure to take Ibuprofen with meals as it can cause an upset stomach. Do not take other NSAIDs while taking Ibuprofen such as (Aleve, Naprosyn, Aspirin, Celebrex, etc) and do not take more than the prescribed dose as this can lead to ulcers and bleeding in your GI tract. You may use warm and cold compresses to help with your symptoms.   Please follow up with your primary doctor within the next 7-10 days for re-evaluation and further treatment of your symptoms.   Please return to the ER sooner if you have any new or worsening symptoms.

## 2021-08-06 NOTE — ED Notes (Signed)
RT educated pt on proper use of MDI w/aerochamber, pt also instructed to hold her breath after each inhalation for better deposition of medication. Pt able to perform w/out difficulty. Pt able to teach back proper use to RT.

## 2021-08-06 NOTE — ED Triage Notes (Signed)
Pt reports chest pain, shob, nausea, ear pain, and eye pain x3 weeks. Pt reports it started out as a cold and has become worse. Denies V/D and abdominal pain.

## 2021-08-06 NOTE — ED Notes (Signed)
Pt ambulated to x-ray.

## 2021-10-04 ENCOUNTER — Telehealth: Payer: Self-pay

## 2021-10-04 ENCOUNTER — Other Ambulatory Visit: Payer: Self-pay

## 2021-10-04 DIAGNOSIS — N898 Other specified noninflammatory disorders of vagina: Secondary | ICD-10-CM

## 2021-10-04 MED ORDER — FLUCONAZOLE 150 MG PO TABS: 150.0000 mg | ORAL_TABLET | Freq: Once | ORAL | 1 refills | Status: AC

## 2021-10-04 NOTE — Telephone Encounter (Signed)
TC to pt regarding request for yeast Rx  Pt not ava LVM for pt to return call.

## 2021-10-04 NOTE — Progress Notes (Signed)
Rx sent for sx's of yeast ok per protocol  Pt made aware.

## 2021-11-23 IMAGING — US US MFM FETAL BPP W/O NON-STRESS
1 series · 16 of 28 positions shown · non-contrast
Comparison: none

[Series 1: us mfm fetal bpp w/o non-stress · 29 acquisitions, 16 frames shown]
[im 1/29]
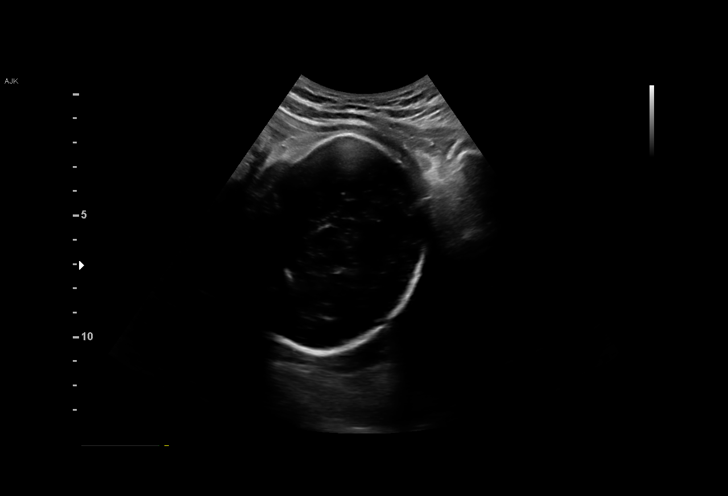
[im 3/29]
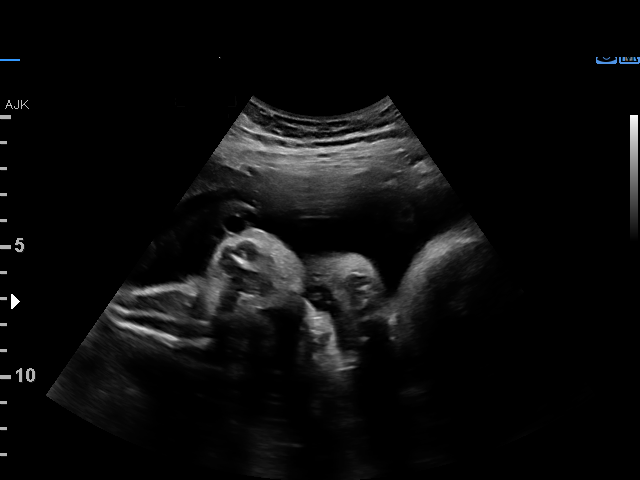
[im 5/29]
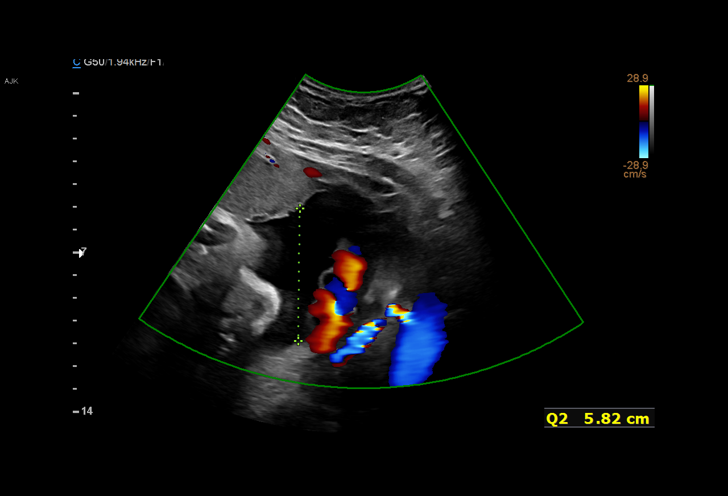
[im 7/29]
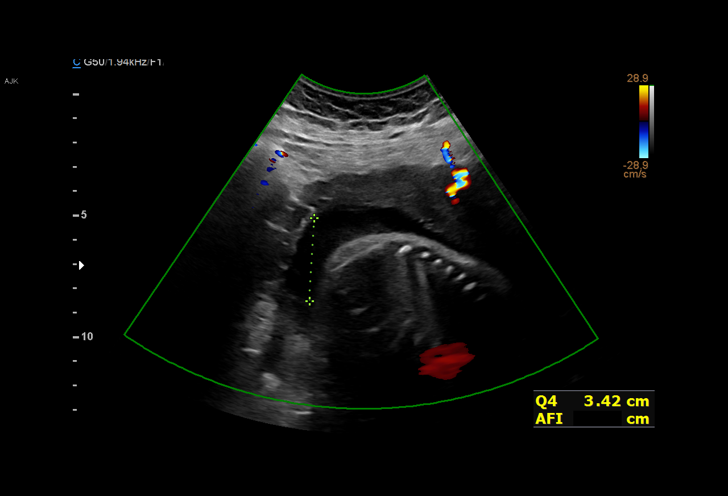
[im 8/29]
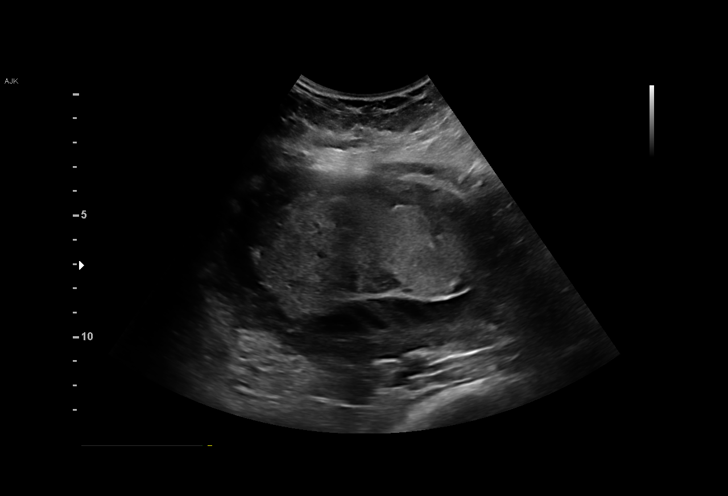
[im 10/29]
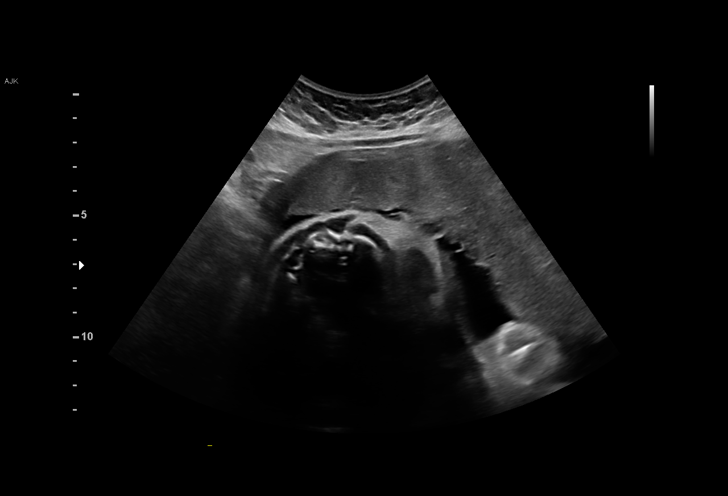
[im 12/29]
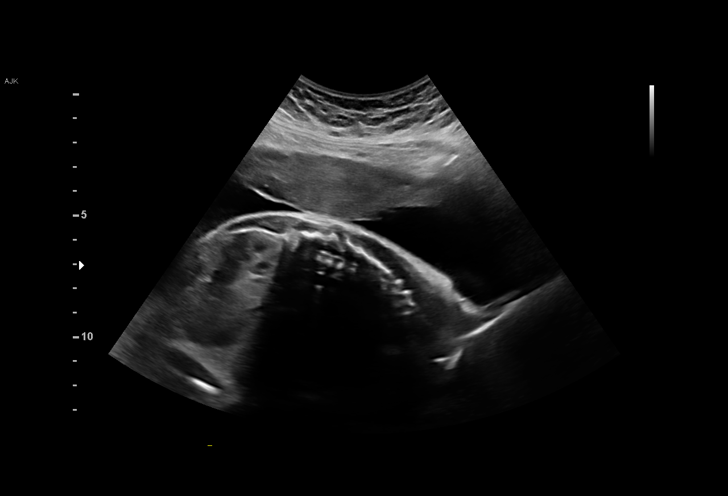
[im 14/29]
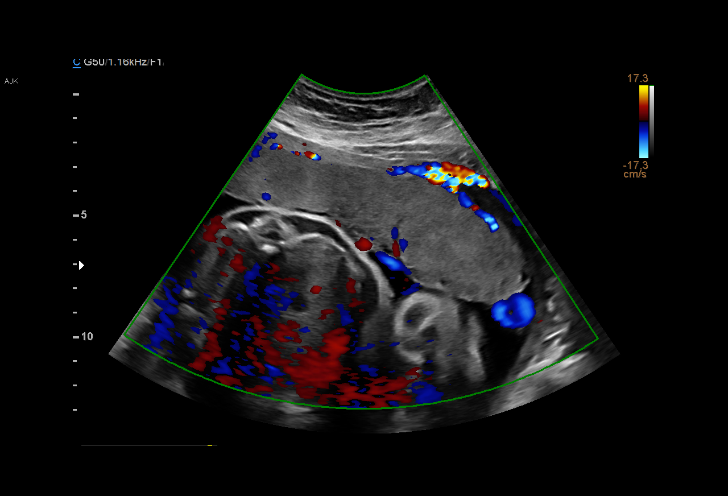
[im 15/29]
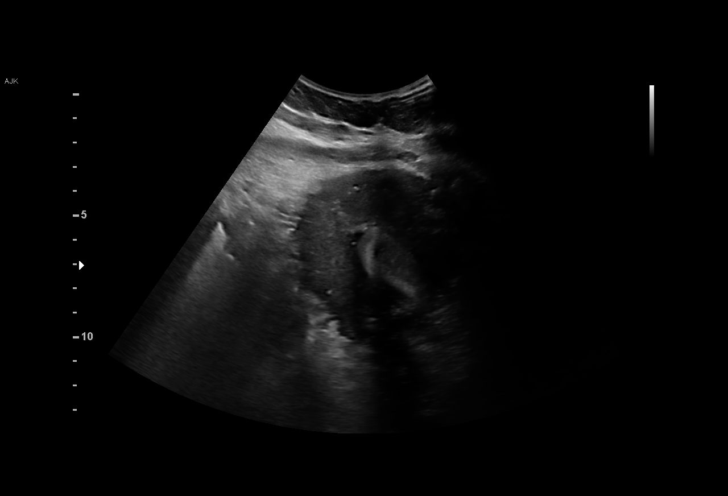
[im 17/29]
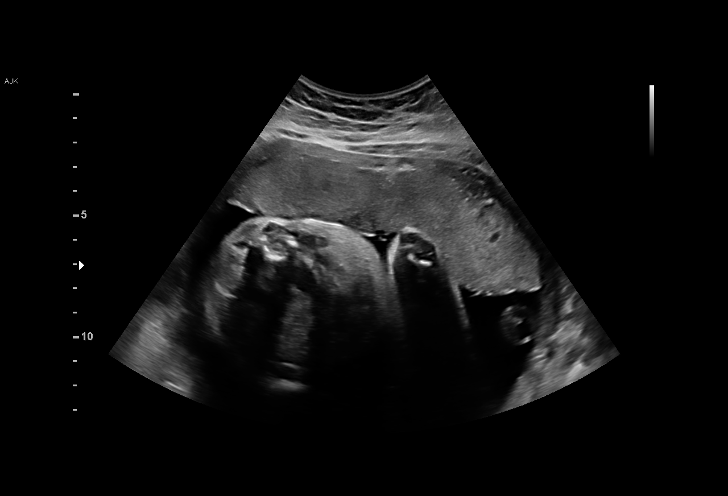
[im 19/29]
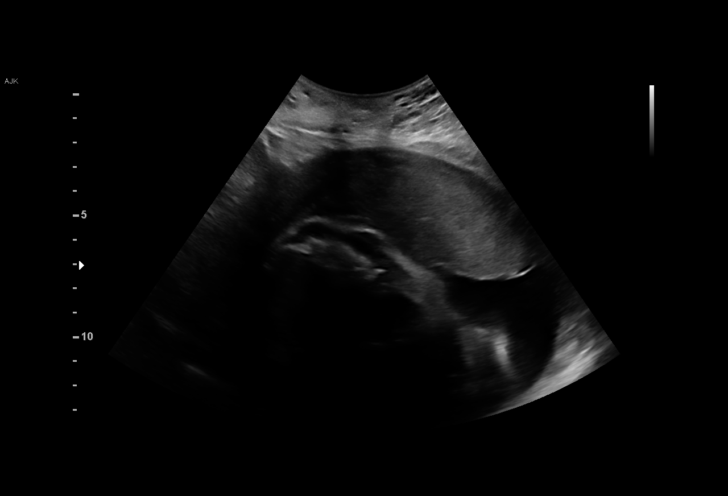
[im 21/29]
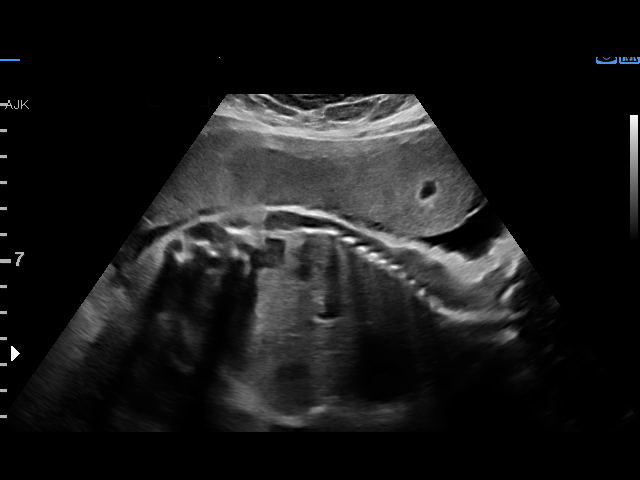
[im 22/29]
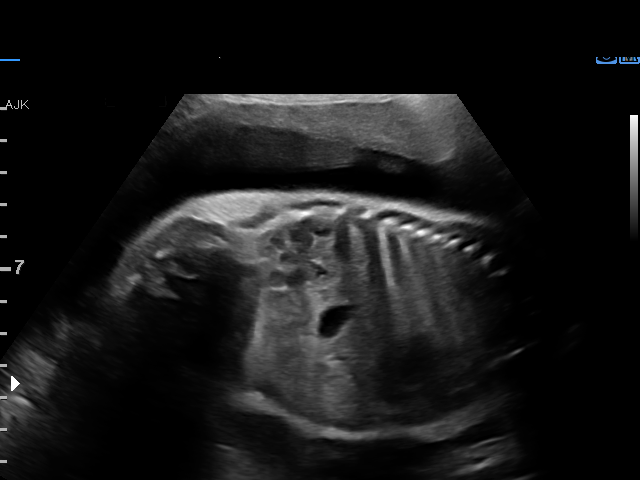
[im 24/29]
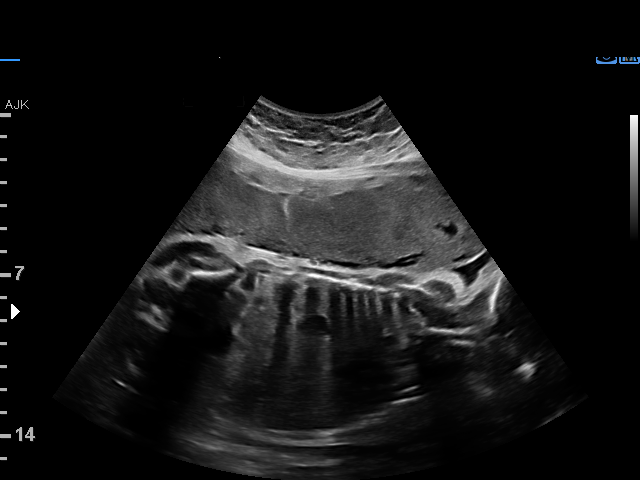
[im 26/29]
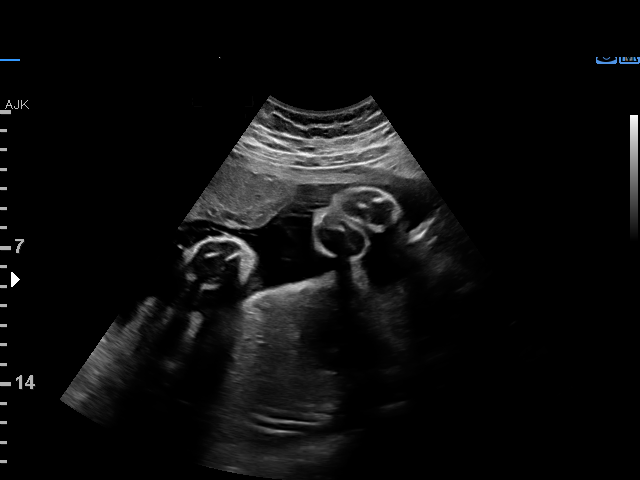
[im 29/29]
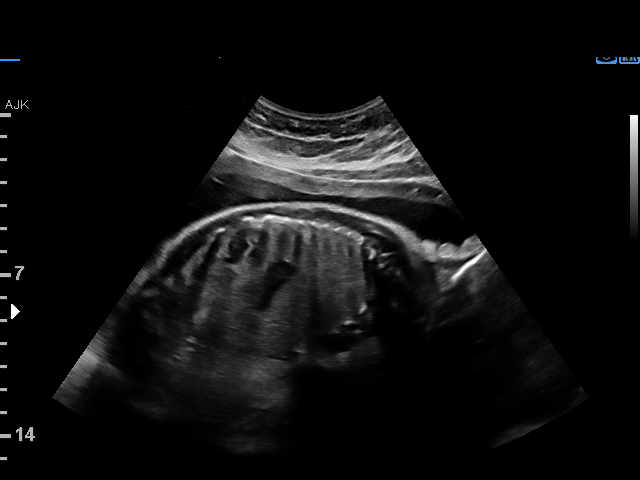

[16 of 28 positions shown; findings below may reference images not displayed]

Indications

 30 weeks gestation of pregnancy
 Decreased fetal movement
 Gestational diabetes in pregnancy,
 unspecified control
 Obesity complicating pregnancy, third
 trimester
Fetal Evaluation

 Num Of Fetuses:          1
 Fetal Heart Rate(bpm):   150
 Cardiac Activity:        Observed
 Presentation:            Cephalic
 Placenta:                Anterior
 P. Cord Insertion:       Visualized, central

 Amniotic Fluid
 AFI FV:      Within normal limits

 AFI Sum(cm)     %Tile       Largest Pocket(cm)
 17.8            67

 RUQ(cm)       RLQ(cm)       LUQ(cm)        LLQ(cm)

Biophysical Evaluation

 Amniotic F.V:   Within normal limits       F. Tone:         Observed
 F. Movement:    Observed                   Score:           [DATE]
 F. Breathing:   Observed
OB History

 Gravidity:    2          SAB:   1
Gestational Age

 LMP:           30w 2d        Date:  12/31/19                 EDD:   10/06/20
 Best:          30w 2d     Det. By:  LMP  (12/31/19)          EDD:   10/06/20
Impression

 Patient was evaluated for c/o decreased fetal movements .
 Amniotic fluid is normal and good fetal activity is seen
 .Antenatal testing is reassuring. BPP [DATE]. Cephalic
 presentation.
                 Pierro, Annamma

## 2021-12-31 IMAGING — US US MFM OB LIMITED
1 series · 11 of 11 positions shown · non-contrast
Comparison: none

[Series 1: us mfm ob limited · 11 acquisitions, 11 frames shown]
[im 1/11]
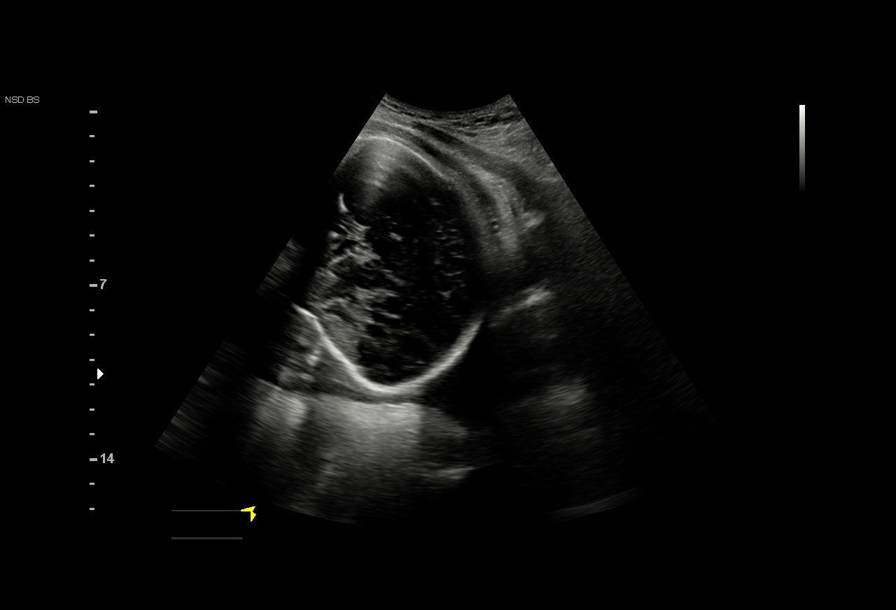
[im 2/11]
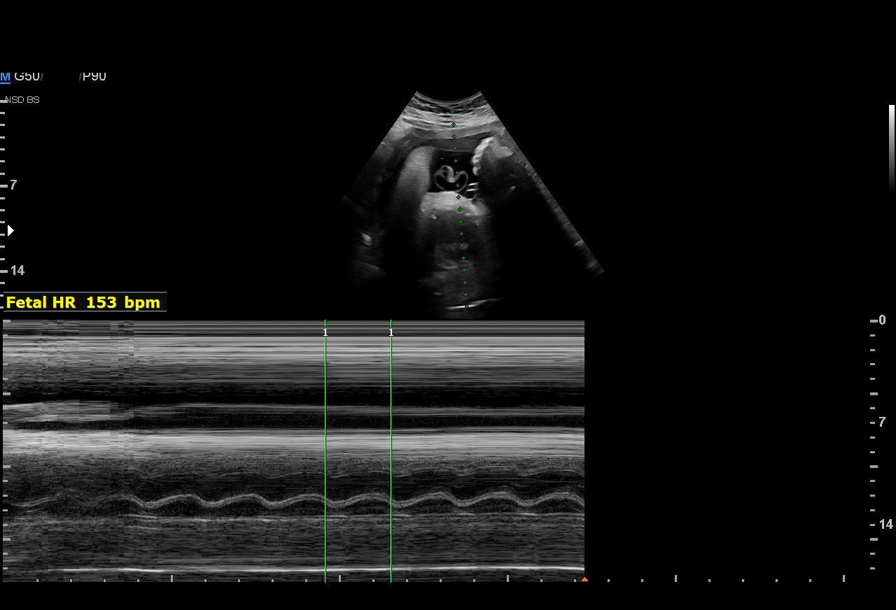
[im 3/11]
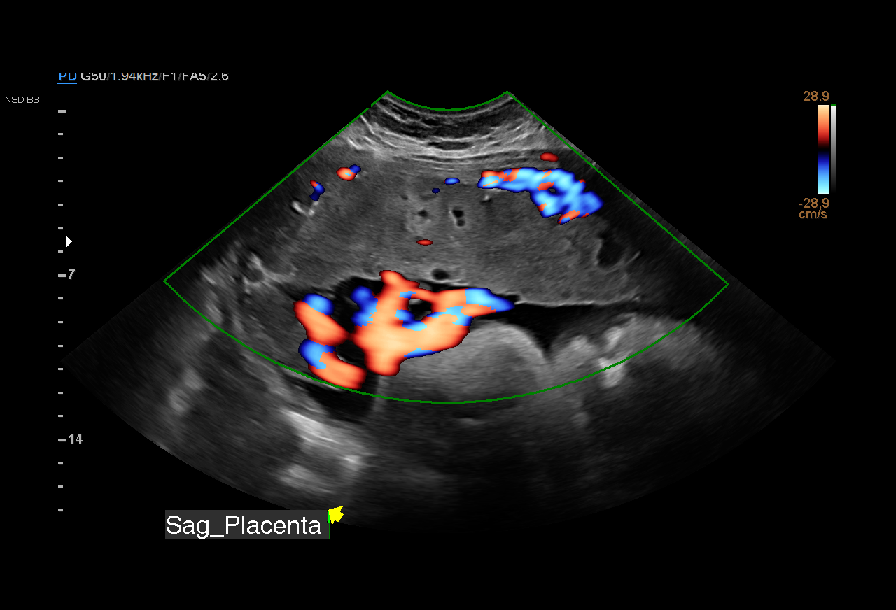
[im 4/11]
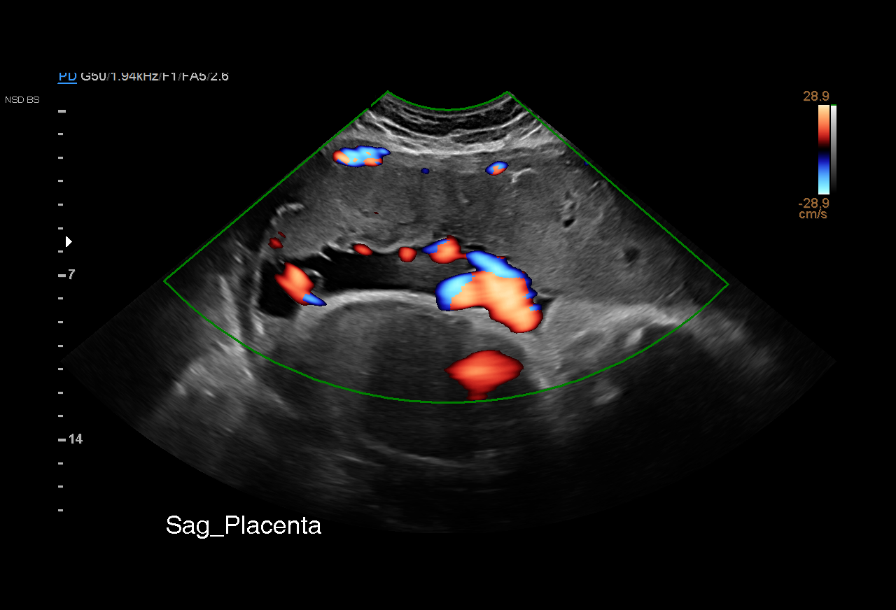
[im 5/11]
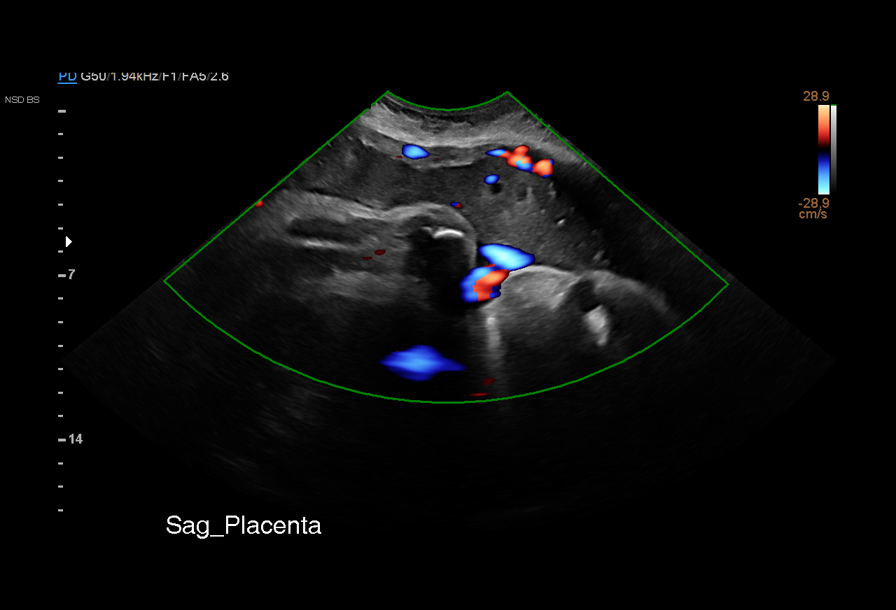
[im 6/11]
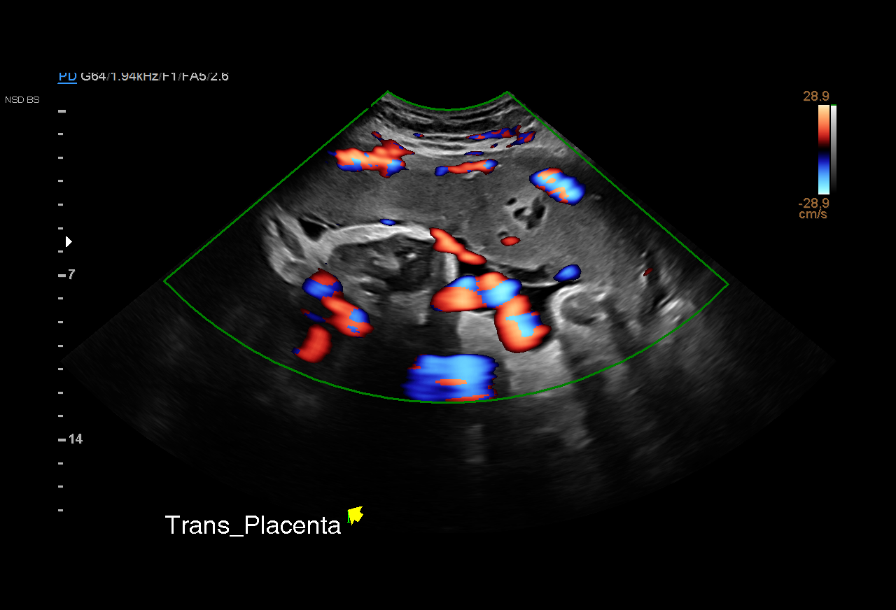
[im 7/11]
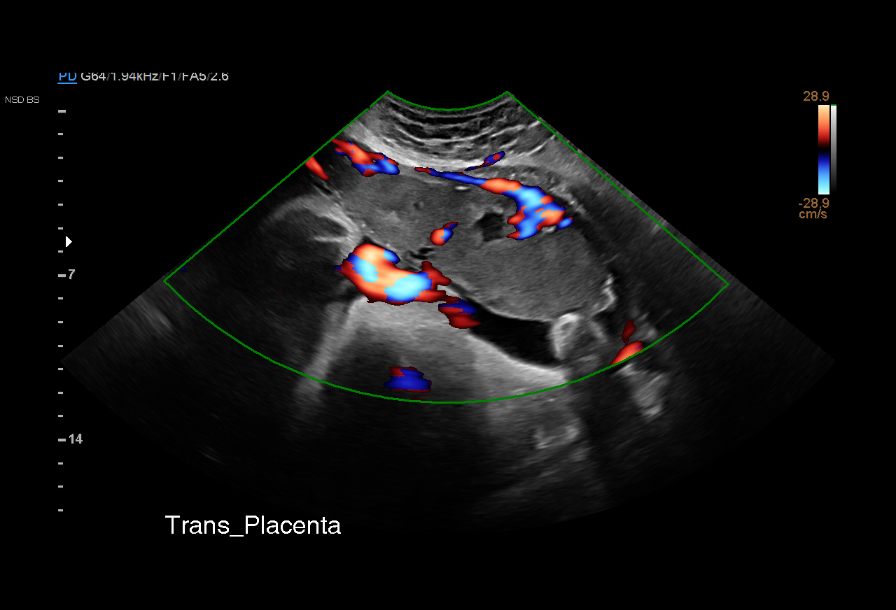
[im 8/11]
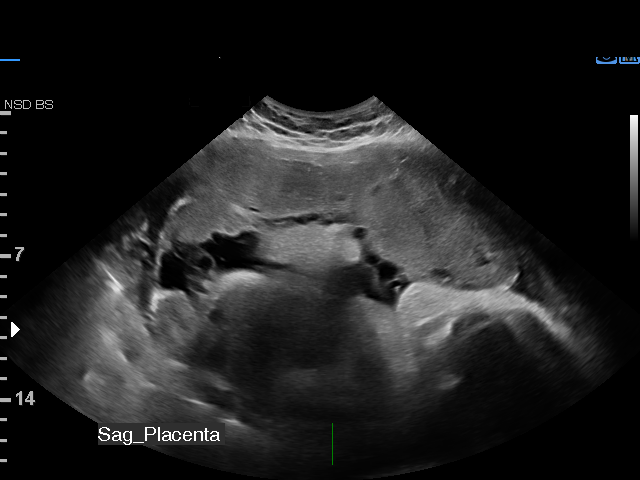
[im 9/11]
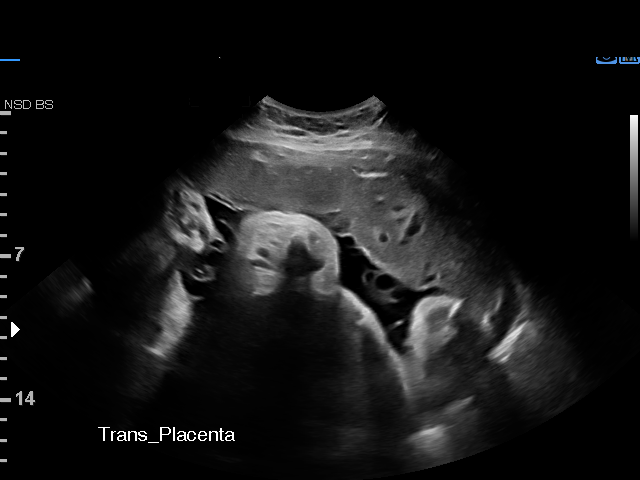
[im 10/11]
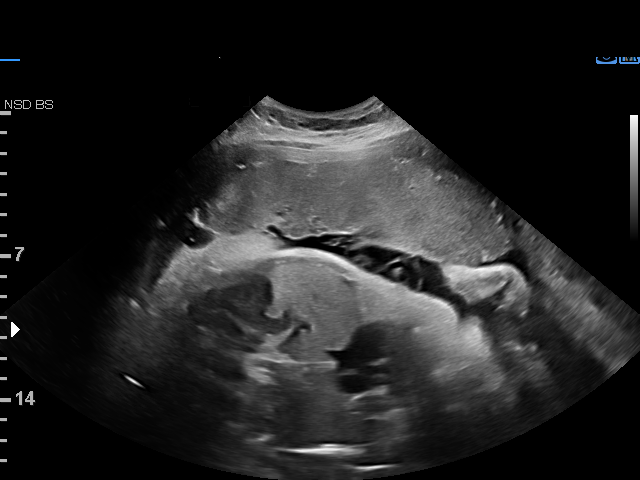
[im 11/11]
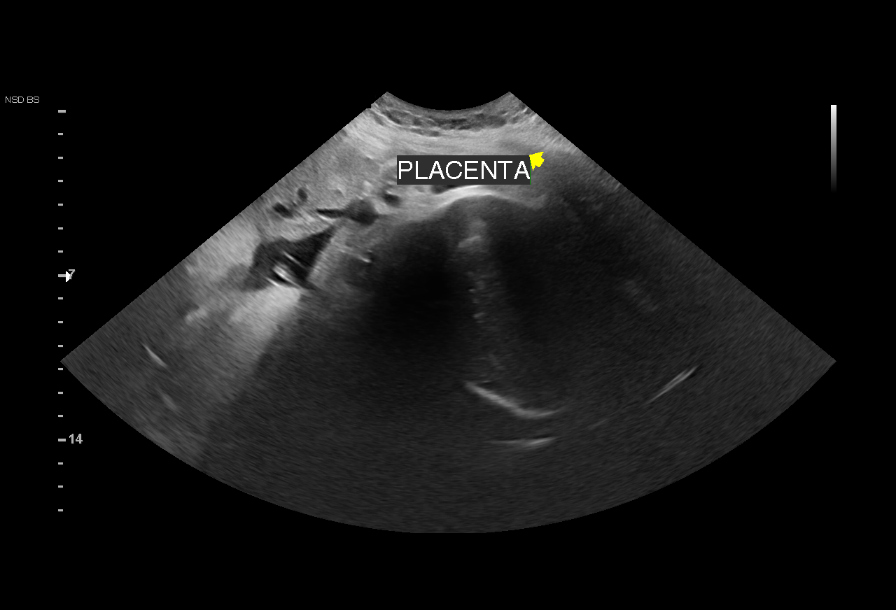

[11 of 11 positions shown; findings below may reference images not displayed]

ERRIN NP                                  [HOSPITAL] at

 1  US MFM OB LIMITED                     [address].01    [facility]

Indications

 Vaginal bleeding in pregnancy, third trimester
 35 weeks gestation of pregnancy
 Pelvic pain affecting pregnancy in third
 trimester
Fetal Evaluation

 Num Of Fetuses:          1
 Fetal Heart Rate(bpm):   153
 Cardiac Activity:        Observed
 Presentation:            Cephalic
 Placenta:                Anterior
 P. Cord Insertion:       Previously Visualized

 Amniotic Fluid
 AFI FV:      Within normal limits

 AFI Sum(cm)     %Tile       Largest Pocket(cm)
 12.4            39

 RUQ(cm)       RLQ(cm)       LUQ(cm)        LLQ(cm)


 Comment:    No placental abruption or previa identified by ultrasound.
OB History
 Gravidity:    2          SAB:   1
Gestational Age

 LMP:           35w 5d        Date:  12/31/19                 EDD:   10/06/20
 Best:          35w 5d     Det. By:  LMP  (12/31/19)          EDD:   10/06/20
Cervix Uterus Adnexa

 Cervix
 Not visualized (advanced GA >78wks)
Impression

 Limited exam to assess vaginal bleeding and uncertain dates
 There is good fetal movement and amniotic fluid
 There is no evidence of placental previa or placenta previa
Recommendations

 Management per inpatient provider

## 2022-01-05 IMAGING — US US MFM FETAL BPP W/O NON-STRESS
1 series · 14 of 28 positions shown · non-contrast
Comparison: none

[Series 1: us mfm fetal bpp w/o non-stress · 48 acquisitions, 14 frames shown]
[im 2/48]
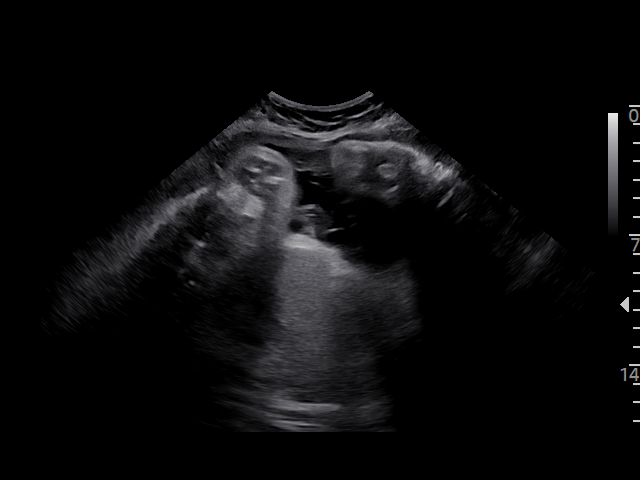
[im 6/48]
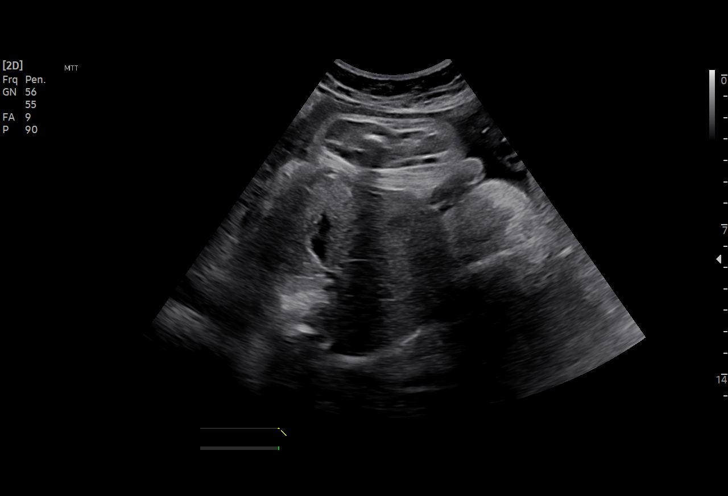
[im 9/48]
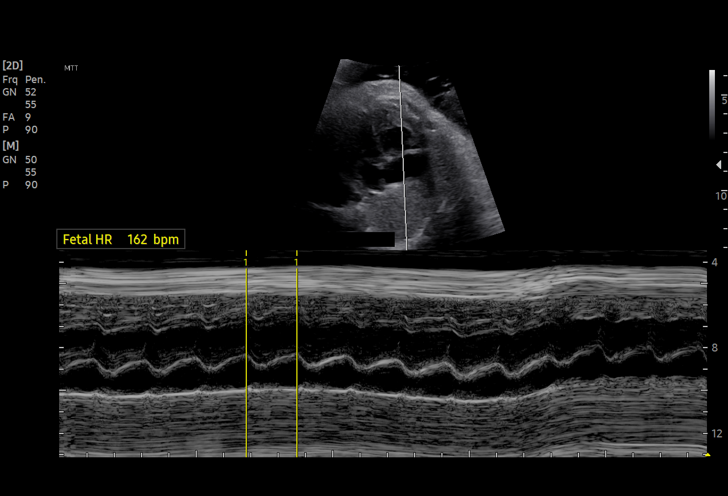
[im 13/48]
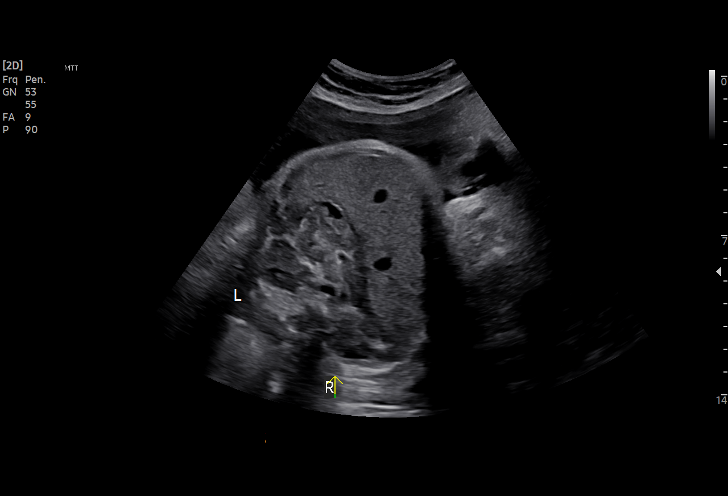
[im 16/48]
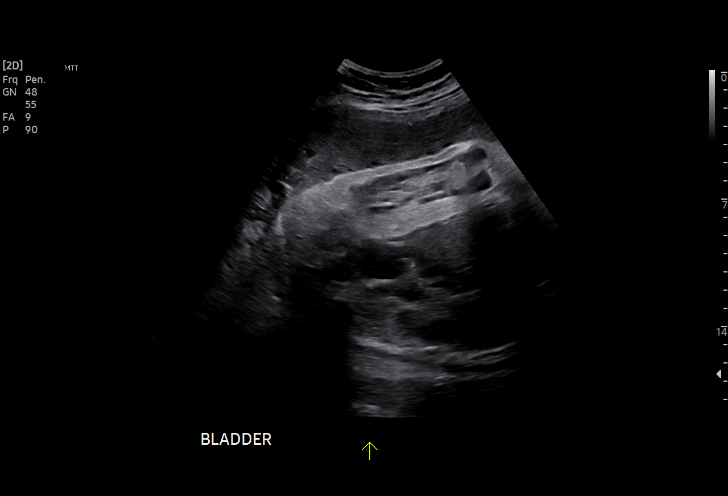
[im 20/48]
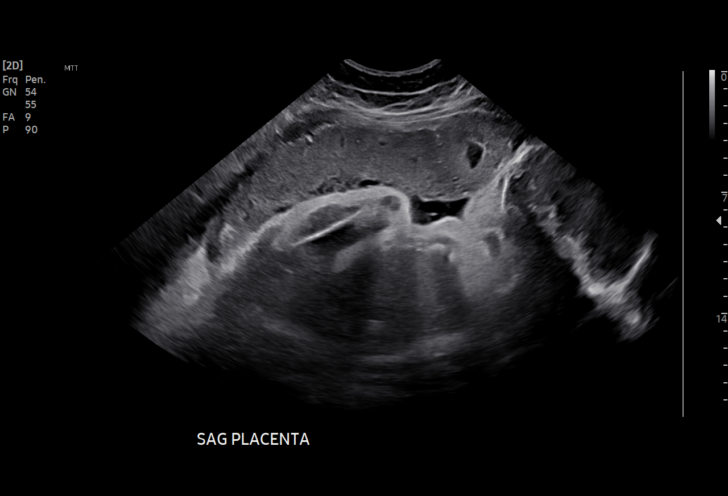
[im 23/48]
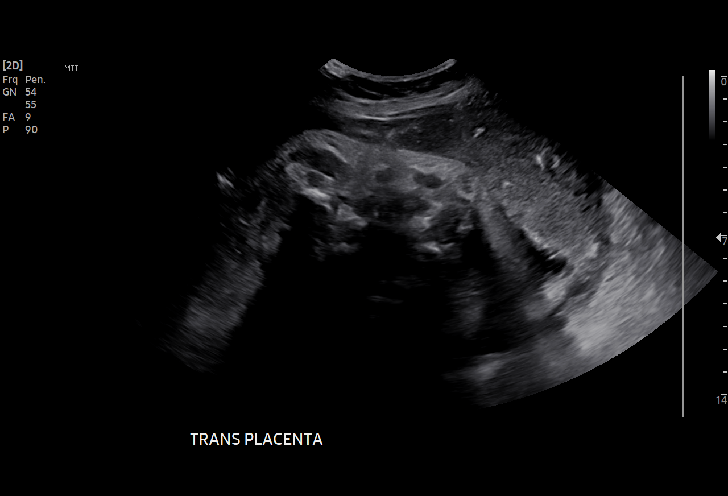
[im 27/48]
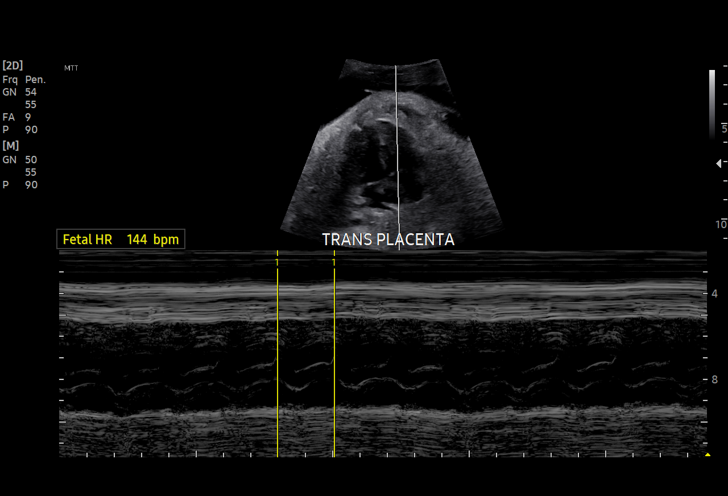
[im 30/48]
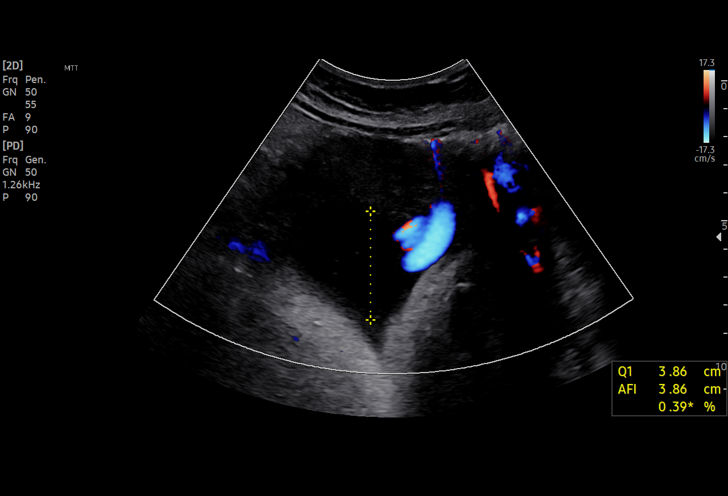
[im 34/48]
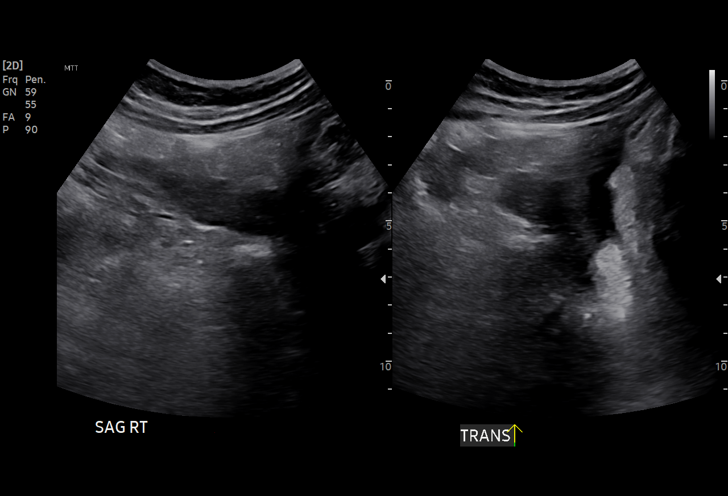
[im 37/48]
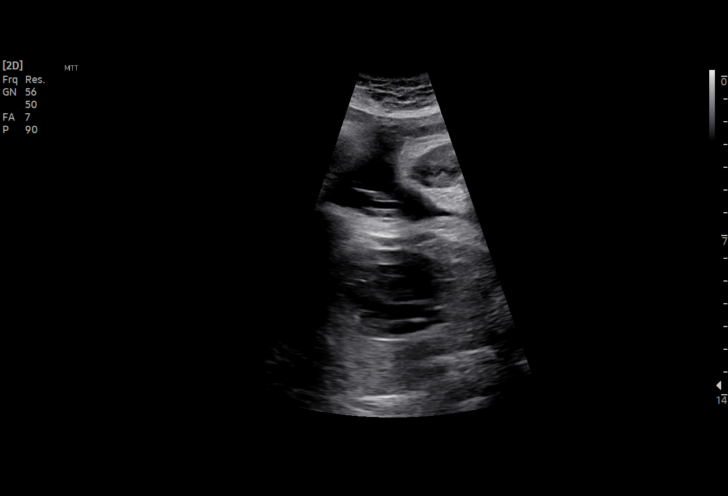
[im 41/48]
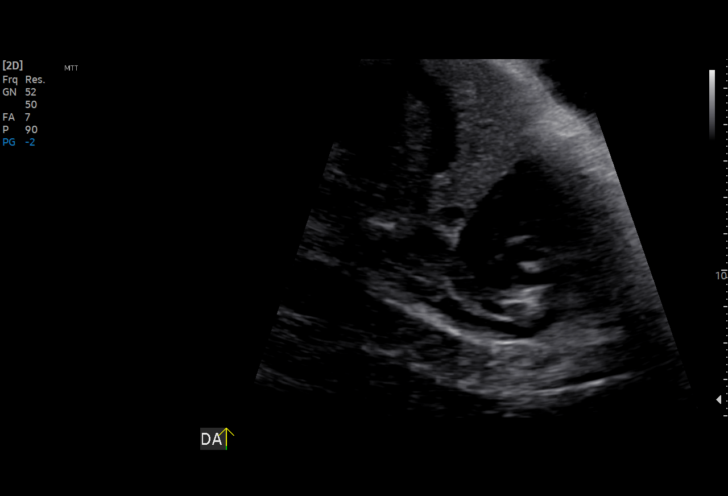
[im 44/48]
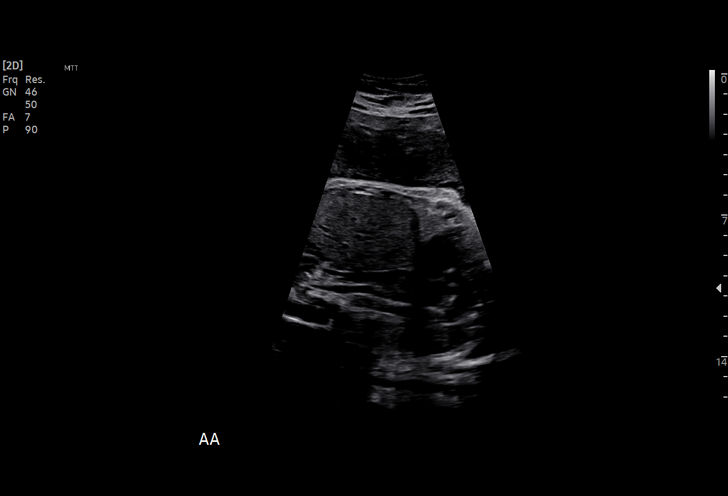
[im 48/48]
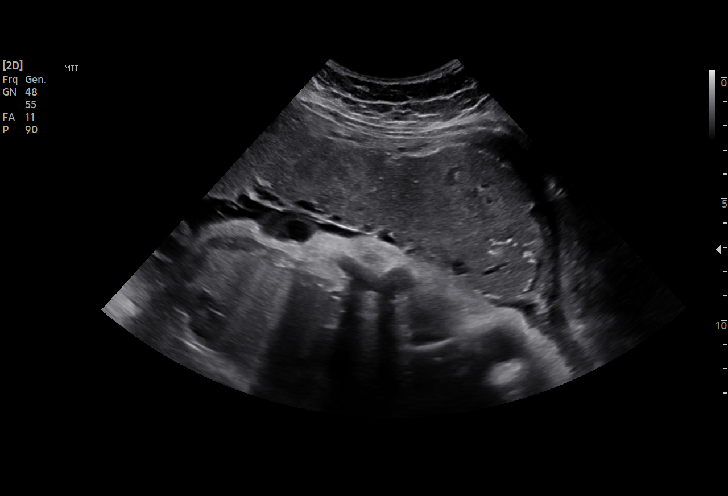

[14 of 28 positions shown; findings below may reference images not displayed]

Indications

 36 weeks gestation of pregnancy
 Vaginal bleeding in pregnancy, third trimester
 Pelvic pain affecting pregnancy in third
 trimester
 Gestational diabetes in pregnancy, diet
 controlled
 Obesity complicating pregnancy, third
 trimester (BMI 40)
 Herpes simplex virus (SURIADI)
 Anemia during pregnancy in third trimester
 Encounter for other antenatal screening
 follow-up
Fetal Evaluation

 Num Of Fetuses:         1
 Fetal Heart Rate(bpm):  162
 Cardiac Activity:       Observed
 Presentation:           Cephalic
 Placenta:               Anterior
 P. Cord Insertion:      Previously Visualized

 Amniotic Fluid
 AFI FV:      Within normal limits

 AFI Sum(cm)     %Tile       Largest Pocket(cm)
 13.6            49

 RUQ(cm)       RLQ(cm)       LUQ(cm)        LLQ(cm)

Biophysical Evaluation

 Amniotic F.V:   Pocket => 2 cm             F. Tone:        Observed
 F. Movement:    Observed                   Score:          [DATE]
 F. Breathing:   Observed
OB History

 Gravidity:    2          SAB:   1
Gestational Age

 LMP:           36w 3d        Date:  12/31/19                 EDD:   10/06/20
 Best:          36w 3d     Det. By:  LMP  (12/31/19)          EDD:   10/06/20
Anatomy

 Cranium:               Previously seen        Aortic Arch:            Not well visualized
 Cavum:                 Previously seen        Ductal Arch:            Previously seen
 Ventricles:            Previously seen        Diaphragm:              Appears normal
 Choroid Plexus:        Previously seen        Stomach:                Appears normal, left
                                                                       sided
 Cerebellum:            Previously seen        Abdomen:                Previously seen
 Posterior Fossa:       Previously seen        Abdominal Wall:         Not well visualized
 Nuchal Fold:           Not applicable (>20    Cord Vessels:           Previously seen
                        wks GA)
 Face:                  Orbits nl; profile not Kidneys:                Appear normal
                        well visualized
 Lips:                  Previously seen        Bladder:                Appears normal
 Thoracic:              Previously seen        Spine:                  Limited views
                                                                       previously seen
 Heart:                 Appears normal         Upper Extremities:      Previously seen
                        (4CH, axis, and
                        situs)
 RVOT:                  Previously seen        Lower Extremities:      Previously seen
 LVOT:                  Appears normal

 Other:  Fetus appears to be a male. Technically difficult due to advanced
         gestational age. Technically difficult due to maternal habitus.
Cervix Uterus Adnexa

 Cervix
 Not visualized (advanced GA >51wks)

 Uterus
 No abnormality visualized.

 Right Ovary
 Not visualized.

 Left Ovary
 Not visualized.

 Cul De Sac
 No free fluid seen.
 Adnexa
 No adnexal mass visualized.
Comments

 [DATE] BPP today.  Fluid WNL today.
Impression

 Gestational diabetes gestational diabetes.  Patient takes
 Metformin for control.  She had a recent episode of vaginal
 bleeding that resolved.

 Amniotic fluid is normal and good fetal activity is seen
 .Antenatal testing is reassuring. BPP [DATE].  Placenta appears
 normal with no evidence of retroplacental hemorrhage.

 We reassured the patient of the findings.
Recommendations

 -We made appointment for her to return on 09/16/2020 for
 NST and then weekly BPP till delivery.
 -Patient had recurrent episode of vaginal bleeding that is not
 related to labor, I recommend delivery.
                 Gosa, Epi

## 2022-01-20 ENCOUNTER — Encounter: Payer: Self-pay | Admitting: Emergency Medicine

## 2022-01-20 ENCOUNTER — Other Ambulatory Visit: Payer: Self-pay | Admitting: Emergency Medicine

## 2022-01-20 DIAGNOSIS — N898 Other specified noninflammatory disorders of vagina: Secondary | ICD-10-CM

## 2022-01-20 MED ORDER — FLUCONAZOLE 150 MG PO TABS: 150.0000 mg | ORAL_TABLET | Freq: Once | ORAL | 0 refills | Status: DC

## 2022-01-20 NOTE — Progress Notes (Signed)
Patient reports vaginal itching since taking antibiotic for dental work. ?Rx sent to pharmacy.  ?Attempt to call patient, no answer. ?MyChart message sent.  ?

## 2022-03-22 ENCOUNTER — Other Ambulatory Visit: Payer: Self-pay | Admitting: Obstetrics & Gynecology

## 2022-03-22 DIAGNOSIS — N898 Other specified noninflammatory disorders of vagina: Secondary | ICD-10-CM

## 2022-05-19 ENCOUNTER — Other Ambulatory Visit: Payer: Self-pay | Admitting: Obstetrics & Gynecology

## 2022-05-19 DIAGNOSIS — N898 Other specified noninflammatory disorders of vagina: Secondary | ICD-10-CM

## 2022-07-28 ENCOUNTER — Emergency Department (HOSPITAL_COMMUNITY): Payer: Medicaid Other

## 2022-07-28 ENCOUNTER — Encounter (HOSPITAL_COMMUNITY): Payer: Self-pay

## 2022-07-28 ENCOUNTER — Emergency Department (HOSPITAL_COMMUNITY)
Admission: EM | Admit: 2022-07-28 | Discharge: 2022-07-28 | Disposition: A | Discharge status: Home / Self Care | Payer: Medicaid Other | Attending: Emergency Medicine | Admitting: Emergency Medicine

## 2022-07-28 DIAGNOSIS — R519 Headache, unspecified: Secondary | ICD-10-CM | POA: Diagnosis present

## 2022-07-28 DIAGNOSIS — G43909 Migraine, unspecified, not intractable, without status migrainosus: Secondary | ICD-10-CM

## 2022-07-28 DIAGNOSIS — H109 Unspecified conjunctivitis: Secondary | ICD-10-CM

## 2022-07-28 DIAGNOSIS — H5711 Ocular pain, right eye: Secondary | ICD-10-CM | POA: Diagnosis not present

## 2022-07-28 LAB — CBC WITH DIFFERENTIAL/PLATELET
Abs Immature Granulocytes: 0.02 10*3/uL (ref 0.00–0.07)
Basophils Absolute: 0 10*3/uL (ref 0.0–0.1)
Basophils Relative: 0 %
Eosinophils Absolute: 0.1 10*3/uL (ref 0.0–0.5)
Eosinophils Relative: 1 %
HCT: 41.9 % (ref 36.0–46.0)
Hemoglobin: 12.3 g/dL (ref 12.0–15.0)
Immature Granulocytes: 0 %
Lymphocytes Relative: 19 %
Lymphs Abs: 1.6 10*3/uL (ref 0.7–4.0)
MCH: 20.5 pg — ABNORMAL LOW (ref 26.0–34.0)
MCHC: 29.4 g/dL — ABNORMAL LOW (ref 30.0–36.0)
MCV: 69.9 fL — ABNORMAL LOW (ref 80.0–100.0)
Monocytes Absolute: 0.3 10*3/uL (ref 0.1–1.0)
Monocytes Relative: 4 %
Neutro Abs: 6.3 10*3/uL (ref 1.7–7.7)
Neutrophils Relative %: 76 %
Platelets: 342 10*3/uL (ref 150–400)
RBC: 5.99 MIL/uL — ABNORMAL HIGH (ref 3.87–5.11)
RDW: 18.1 % — ABNORMAL HIGH (ref 11.5–15.5)
WBC: 8.4 10*3/uL (ref 4.0–10.5)
nRBC: 0 % (ref 0.0–0.2)

## 2022-07-28 LAB — BASIC METABOLIC PANEL
Anion gap: 8 (ref 5–15)
BUN: 10 mg/dL (ref 6–20)
CO2: 28 mmol/L (ref 22–32)
Calcium: 9.4 mg/dL (ref 8.9–10.3)
Chloride: 102 mmol/L (ref 98–111)
Creatinine, Ser: 0.89 mg/dL (ref 0.44–1.00)
GFR, Estimated: >60 mL/min (ref >60)
Glucose, Bld: 98 mg/dL (ref 70–99)
Potassium: 4.1 mmol/L (ref 3.5–5.1)
Sodium: 138 mmol/L (ref 135–145)

## 2022-07-28 MED ORDER — LACTATED RINGERS IV BOLUS
1000.0000 mL | Freq: Once | INTRAVENOUS | Status: AC
  Administered 2022-07-28: 1000 mL via INTRAVENOUS

## 2022-07-28 MED ORDER — TETRACAINE HCL 0.5 % OP SOLN
1.0000 [drp] | Freq: Once | OPHTHALMIC | Status: AC
  Administered 2022-07-28: 1 [drp] via OPHTHALMIC
  Filled 2022-07-28: qty 4

## 2022-07-28 MED ORDER — LACTATED RINGERS IV SOLN: INTRAVENOUS | Status: DC

## 2022-07-28 MED ORDER — METOCLOPRAMIDE HCL 5 MG/ML IJ SOLN
10.0000 mg | Freq: Once | INTRAMUSCULAR | Status: DC
  Filled 2022-07-28: qty 2

## 2022-07-28 MED ORDER — BUTALBITAL-APAP-CAFFEINE 50-325-40 MG PO TABS: 1.0000 | ORAL_TABLET | Freq: Four times a day (QID) | ORAL | 0 refills | Status: DC | PRN

## 2022-07-28 MED ORDER — GATIFLOXACIN 0.5 % OP SOLN
1.0000 [drp] | Freq: Four times a day (QID) | OPHTHALMIC | Status: DC
  Administered 2022-07-28: 1 [drp] via OPHTHALMIC
  Filled 2022-07-28: qty 2.5

## 2022-07-28 MED ORDER — GADOPICLENOL 0.5 MMOL/ML IV SOLN
9.0000 mL | Freq: Once | INTRAVENOUS | Status: AC | PRN
  Administered 2022-07-28: 9 mL via INTRAVENOUS

## 2022-07-28 MED ORDER — KETOROLAC TROMETHAMINE 30 MG/ML IJ SOLN
15.0000 mg | Freq: Once | INTRAMUSCULAR | Status: AC
  Administered 2022-07-28: 15 mg via INTRAVENOUS
  Filled 2022-07-28: qty 1

## 2022-07-28 MED ORDER — FLUORESCEIN SODIUM 1 MG OP STRP
1.0000 | ORAL_STRIP | Freq: Once | OPHTHALMIC | Status: AC
  Administered 2022-07-28: 1 via OPHTHALMIC
  Filled 2022-07-28: qty 1

## 2022-07-28 MED ORDER — DIPHENHYDRAMINE HCL 50 MG/ML IJ SOLN
12.5000 mg | Freq: Once | INTRAMUSCULAR | Status: DC
  Filled 2022-07-28: qty 1

## 2022-07-28 NOTE — ED Triage Notes (Signed)
Pt presents with c/o headache and bilateral eye pain for 2 days. Pt reports that she feels like she has been having periods of "blackouts".

## 2022-07-28 NOTE — ED Notes (Signed)
Pt to MRI

## 2022-07-28 NOTE — ED Provider Notes (Addendum)
[facility] DEPT Provider Note   CSN: [id number] Arrival date & time: 07/28/22  0847     History  Chief Complaint  Patient presents with   Headache   Eye Pain    ANAYELY CONSTANTINE is a 31 y.o. female.  31 year old female who presents with 2 days of bitemporal pain and headache similar to her prior migraines.  Has had some photophobia.  Notes some eye watering but no drainage.  No visual changes.  States that when she is driving feels as if she is going to blackout but has not lost consciousness.  No cardiac symptoms associated with that.  Denies any peripheral weakness but has had some peripheral paresthesias.  Patient notes decreased sleep recently.  No treatment use prior to arrival.       Home Medications Prior to Admission medications   Medication Sig Start Date End Date Taking? Authorizing Provider  acetaminophen (TYLENOL) 500 MG tablet Take 1,000 mg by mouth 2 (two) times daily as needed for pain.  Patient not taking: No sig reported    [provider]  albuterol (PROVENTIL HFA;VENTOLIN HFA) 108 (90 Base) MCG/ACT inhaler Inhale 1-2 puffs into the lungs every 6 (six) hours as needed for wheezing or shortness of breath. Patient not taking: No sig reported 12/04/18   Tasia Catchings, Amy V, PA-C  amLODipine (NORVASC) 5 MG tablet TAKE 1 TABLET (5 MG TOTAL) BY MOUTH DAILY. Patient not taking: Reported on 11/25/2020 09/24/20 16/0/73  Arrie Senate, MD  coconut oil OIL Apply 1 application topically as needed. Patient not taking: Reported on 11/25/2020 71/0/62   Arrie Senate, MD  cyclobenzaprine (FLEXERIL) 5 MG tablet Take 1 tablet (5 mg total) by mouth 3 (three) times daily as needed for muscle spasms. Do not drink alcohol or drive while taking this medication. May cause drowsiness 05/21/21   Volney American, PA-C  dicloxacillin (DYNAPEN) 500 MG capsule Take 1 capsule (500 mg total) by mouth 4 (four) times daily. Patient not taking: No sig  reported 09/29/20   Donnamae Jude, MD  ferrous sulfate 325 (65 FE) MG tablet TAKE 1 TABLET (325 MG TOTAL) BY MOUTH EVERY OTHER DAY. Patient not taking: No sig reported 09/24/20 09/24/21  Janet Berlin, MD  fluconazole (DIFLUCAN) 150 MG tablet Take 1 tablet by mouth once for 1 dose. 05/20/22   Anyanwu, Sallyanne Havers, MD  ibuprofen (ADVIL) 600 MG tablet Take 1 tablet (600 mg total) by mouth every 6 (six) hours as needed. Patient not taking: Reported on 11/25/2020 10/07/20   Aletha Halim, MD  ibuprofen (ADVIL) 800 MG tablet Take 1 tablet (800 mg total) by mouth 3 (three) times daily. 05/21/21   Volney American, PA-C  Magnesium Malate 1250 (141.7 Mg) MG TABS Take 1 tablet by mouth daily. Patient not taking: Reported on 11/25/2020 10/06/20   Aletha Halim, MD  metroNIDAZOLE (FLAGYL) 500 MG tablet Take 1 tablet (500 mg total) by mouth 2 (two) times daily. 03/23/21   Darlina Rumpf, CNM  norethindrone (MICRONOR) 0.35 MG tablet TAKE 1 TABLET (0.35 MG TOTAL) BY MOUTH DAILY. Patient not taking: Reported on 04/12/2021 09/24/20 09/24/21  Janet Berlin, MD  oxyCODONE-acetaminophen (PERCOCET/ROXICET) 5-325 MG tablet Take 1 tablet by mouth every 6 (six) hours as needed. Patient not taking: Reported on 11/25/2020 10/02/20   Aletha Halim, MD  Prenatal Vit-Fe Fumarate-FA (PRENATAL VITAMIN PO) Take by mouth.    [provider]  vitamin C (ASCORBIC ACID) 250 MG tablet Take  1 tablet (250 mg total) by mouth every other day. Take with iron. Patient not taking: Reported on 11/25/2020 09/23/20   [name], MD      Allergies    Latex    Review of Systems   Review of Systems  All other systems reviewed and are negative.   Physical Exam Updated Vital Signs BP (!) 118/90 (BP Location: Right Arm)   Pulse 75   Temp 98 F (36.7 C) (Oral)   Resp 18   LMP 07/15/2022   SpO2 99%  Physical Exam Vitals and nursing note reviewed.  Constitutional:      General: She is not in acute  distress.    Appearance: Normal appearance. She is well-developed. She is not toxic-appearing.  HENT:     Head: Normocephalic and atraumatic.  Eyes:     General: Lids are normal.     Intraocular pressure: Right eye pressure is 20 mmHg. Left eye pressure is 18 mmHg. Measurements were taken using a handheld tonometer.    Conjunctiva/sclera:     Right eye: Right conjunctiva is injected.     Pupils: Pupils are equal, round, and reactive to light.     Comments: Patient's visit acuity noted at 20/20 left 20/20 right  Neck:     Thyroid: No thyroid mass.     Trachea: No tracheal deviation.  Cardiovascular:     Rate and Rhythm: Normal rate and regular rhythm.     Heart sounds: Normal heart sounds. No murmur heard.    No gallop.  Pulmonary:     Effort: Pulmonary effort is normal. No respiratory distress.     Breath sounds: Normal breath sounds. No stridor. No decreased breath sounds, wheezing, rhonchi or rales.  Abdominal:     General: There is no distension.     Palpations: Abdomen is soft.     Tenderness: There is no abdominal tenderness. There is no rebound.  Musculoskeletal:        General: No tenderness. Normal range of motion.     Cervical back: Normal range of motion and neck supple.  Skin:    General: Skin is warm and dry.     Findings: No abrasion or rash.  Neurological:     General: No focal deficit present.     Mental Status: She is alert and oriented to person, place, and time. Mental status is at baseline.     GCS: GCS eye subscore is 4. GCS verbal subscore is 5. GCS motor subscore is 6.     Cranial Nerves: No cranial nerve deficit.     Sensory: No sensory deficit.     Motor: Motor function is intact.     Coordination: Coordination is intact.     Gait: Gait is intact.  Psychiatric:        Attention and Perception: Attention normal.        Speech: Speech normal.        Behavior: Behavior normal.     ED Results / Procedures / Treatments   Labs (all labs ordered are  listed, but only abnormal results are displayed) Labs Reviewed - No data to display  EKG None  Radiology No results found.  Procedures Procedures    Medications Ordered in ED Medications  lactated ringers infusion (has no administration in time range)  lactated ringers bolus 1,000 mL (has no administration in time range)  metoCLOPramide (REGLAN) injection 10 mg (has no administration in time range)  diphenhydrAMINE (BENADRYL) injection 12.5 mg (has  no administration in time range)    ED Course/ Medical Decision Making/ A&P                           Medical Decision Making Amount and/or Complexity of Data Reviewed Labs: ordered. Radiology: ordered. ECG/medicine tests: ordered.  Risk Prescription drug management.   Patient presents with bitemporal headache concerning for migraine.  Head CT showed possible mass behind the orbits which was followed up by an MRI of brain and orbits with not show any acute findings.  Patient with possible ductulitis but concern for possible glaucoma patient is intraocular pressure measurement done and appropriate.  No concern for endophthalmitis.  Considered meningitis but feel less likely.  No concern for temporal arteritis.  No evidence of corneal abrasions.  Patient denies any foreign body sensation.  Fluorescein staining negative.patient given Toradol headache has improved.  Plan will be to treat patient with antibiotics for possible conjunctivitis.  Patient also given pain medication for likely migraine.  We will give ophthalmology referral and return precautions given        Final Clinical Impression(s) / ED Diagnoses Final diagnoses:  None    Rx / DC Orders ED Discharge Orders     None         Lorre Nick, MD 07/28/22 1620    Lorre Nick, MD 07/28/22 1622

## 2022-07-28 NOTE — ED Notes (Signed)
[employer] Pocket Screener Bilateral: 20/20 Left: 20/20 Right: 20/50

## 2022-07-28 NOTE — Discharge Instructions (Addendum)
Place 1 drop into each eye 4 times a day for the next 5 days

## 2022-09-09 ENCOUNTER — Other Ambulatory Visit: Payer: Self-pay | Admitting: Obstetrics & Gynecology

## 2022-09-09 DIAGNOSIS — N898 Other specified noninflammatory disorders of vagina: Secondary | ICD-10-CM

## 2022-10-02 ENCOUNTER — Emergency Department (HOSPITAL_COMMUNITY): Payer: Medicaid Other

## 2022-10-02 ENCOUNTER — Other Ambulatory Visit: Payer: Self-pay

## 2022-10-02 ENCOUNTER — Emergency Department (HOSPITAL_COMMUNITY)
Admission: EM | Admit: 2022-10-02 | Discharge: 2022-10-02 | Disposition: A | Discharge status: Home / Self Care | Payer: Medicaid Other | Attending: Emergency Medicine | Admitting: Emergency Medicine

## 2022-10-02 DIAGNOSIS — M2550 Pain in unspecified joint: Secondary | ICD-10-CM | POA: Diagnosis not present

## 2022-10-02 DIAGNOSIS — B338 Other specified viral diseases: Secondary | ICD-10-CM

## 2022-10-02 DIAGNOSIS — M791 Myalgia, unspecified site: Secondary | ICD-10-CM | POA: Diagnosis not present

## 2022-10-02 DIAGNOSIS — R059 Cough, unspecified: Secondary | ICD-10-CM | POA: Diagnosis present

## 2022-10-02 DIAGNOSIS — Z1152 Encounter for screening for COVID-19: Secondary | ICD-10-CM | POA: Insufficient documentation

## 2022-10-02 DIAGNOSIS — B974 Respiratory syncytial virus as the cause of diseases classified elsewhere: Secondary | ICD-10-CM | POA: Diagnosis not present

## 2022-10-02 LAB — RESP PANEL BY RT-PCR (RSV, FLU A&B, COVID)  RVPGX2
Influenza A by PCR: NEGATIVE
Influenza B by PCR: NEGATIVE
Resp Syncytial Virus by PCR: POSITIVE — AB
SARS Coronavirus 2 by RT PCR: NEGATIVE

## 2022-10-02 MED ORDER — IBUPROFEN 200 MG PO TABS
600.0000 mg | ORAL_TABLET | Freq: Once | ORAL | Status: AC
  Administered 2022-10-02: 600 mg via ORAL
  Filled 2022-10-02: qty 3

## 2022-10-02 NOTE — ED Provider Triage Note (Signed)
Emergency Medicine Provider Triage Evaluation Note  RORI GOAR , a 31 y.o. female  was evaluated in triage.  Pt complains of cough, sore throat, diffuse myalgias/arthralgias beginning yesterday.  Patient states that her son has been sick for the past several days with the same symptoms.  Review of Systems  Positive: See above Negative:   Physical Exam  There were no vitals taken for this visit. Gen:   Awake, no distress   Resp:  Normal effort  MSK:   Moves extremities without difficulty  Other:  No obvious wheeze, rales, rhonchi.  Medical Decision Making  Medically screening exam initiated at 3:20 PM.  Appropriate orders placed.  ALEXSYS ESKIN was informed that the remainder of the evaluation will be completed by another provider, this initial triage assessment does not replace that evaluation, and the importance of remaining in the ED until their evaluation is complete.     Peter Garter, [place] 10/02/22 1521

## 2022-10-02 NOTE — Progress Notes (Signed)
Orthopedic Tech Progress Note Patient Details:  Laura Hernandez 15-Jun-1991 [id number]  Ortho Devices Type of Ortho Device: Knee Sleeve Ortho Device/Splint Location: LLE Ortho Device/Splint Interventions: Ordered, Adjustment, Application   Post Interventions Patient Tolerated: Well  Al Decant 10/02/2022, 5:43 PM

## 2022-10-02 NOTE — Discharge Instructions (Addendum)
The workup today was overall consistent with RSV infection.  Your chest x-ray was clear of any signs of pneumonia.  Your left knee x-ray was clear of any fracture or dislocation.  Recommend continued therapy at home with Tylenol/Motrin as needed for pain.  Recommend daily antihistamine in the form of Zyrtec.  Recommend steroid nasal spray to help with nasal congestion in the form of Flonase/Nasacort.  Recommend reevaluation by primary care in 3 to 5 days.  Please not hesitate to return to emergency department for worrisome signs and symptoms to be discussed become apparent.

## 2022-10-02 NOTE — ED Provider Notes (Signed)
[facility]-EMERGENCY DEPT Provider Note   CSN: [id number] Arrival date & time: 10/02/22  1450     History  Chief Complaint  Patient presents with   Cough    Laura Hernandez is a 31 y.o. female.   Cough   31 year old female presents emergency department with complaints of cough, sore throat, diffuse myalgias/arthralgias beginning yesterday.  Patient has a son at home which has similar symptoms and has been sick for the past several days.  Denies chest pain, shortness of breath, abdominal pain, nausea, vomiting, known fever.    Past medical history significant for gestational diabetes  Home Medications Prior to Admission medications   Medication Sig Start Date End Date Taking? Authorizing Provider  Biotin w/ Vitamins C & E (HAIR/SKIN/NAILS PO) Take 1 capsule by mouth daily.    [provider]  butalbital-acetaminophen-caffeine (FIORICET) 50-325-40 MG tablet Take 1-2 tablets by mouth every 6 (six) hours as needed for headache. 07/28/22 07/28/23  Lorre Nick, MD  cetirizine (ZYRTEC) 10 MG tablet Take 10 mg by mouth daily. 07/06/22   [provider]  cyclobenzaprine (FLEXERIL) 5 MG tablet Take 1 tablet (5 mg total) by mouth 3 (three) times daily as needed for muscle spasms. Do not drink alcohol or drive while taking this medication. May cause drowsiness Patient taking differently: Take 5 mg by mouth 3 (three) times daily as needed for muscle spasms. 05/21/21   Particia Nearing, PA-C  fluconazole (DIFLUCAN) 150 MG tablet TAKE 1 TABLET BY MOUTH ONCE FOR 1 DOSE 09/13/22   Anyanwu, Jethro Bastos, MD  metFORMIN (GLUCOPHAGE) 500 MG tablet Take 500 mg by mouth at bedtime. 07/06/22   [provider]  TRI-SPRINTEC 0.18/0.215/0.25 MG-35 MCG tablet Take 1 tablet by mouth daily. 07/06/22   [provider]      Allergies    Latex    Review of Systems   Review of Systems  Respiratory:  Positive for cough.   All other systems reviewed and  are negative.   Physical Exam Updated Vital Signs BP (!) 122/90   Pulse 90   Temp 100 F (37.8 C) (Oral)   Resp 17   Ht 5' (1.524 m)   Wt 91.2 kg   SpO2 98%   BMI 39.27 kg/m  Physical Exam Vitals and nursing note reviewed.  Constitutional:      General: She is not in acute distress.    Appearance: She is well-developed.  HENT:     Head: Normocephalic and atraumatic.     Nose: Congestion present.     Mouth/Throat:     Pharynx: Posterior oropharyngeal erythema present.     Comments: Mild posterior pharyngeal erythema noted.  Uvula midline with symmetrical phonation.  Tonsils are 1+ bilateral with no obvious exudate.  No sublingual or submandibular swelling noted. Eyes:     Conjunctiva/sclera: Conjunctivae normal.  Cardiovascular:     Rate and Rhythm: Normal rate and regular rhythm.     Heart sounds: No murmur heard. Pulmonary:     Effort: Pulmonary effort is normal. No respiratory distress.     Breath sounds: Normal breath sounds. No stridor. No wheezing, rhonchi or rales.  Abdominal:     Palpations: Abdomen is soft.     Tenderness: There is no abdominal tenderness. There is no guarding.  Musculoskeletal:        General: No swelling.     Cervical back: Neck supple.     Right lower leg: No edema.  Left lower leg: No edema.  Skin:    General: Skin is warm and dry.     Capillary Refill: Capillary refill takes less than 2 seconds.  Neurological:     Mental Status: She is alert.  Psychiatric:        Mood and Affect: Mood normal.     ED Results / Procedures / Treatments   Labs (all labs ordered are listed, but only abnormal results are displayed) Labs Reviewed  RESP PANEL BY RT-PCR (RSV, FLU A&B, COVID)  RVPGX2 - Abnormal; Notable for the following components:      Result Value   Resp Syncytial Virus by PCR POSITIVE (*)    All other components within normal limits    EKG None  Radiology DG Knee Complete 4 Views Left  Result Date: 10/02/2022 CLINICAL  DATA:  Left knee pain. Fall today. EXAM: LEFT KNEE - COMPLETE 4+ VIEW COMPARISON:  Radiograph 05/29/2021 FINDINGS: No evidence of fracture, dislocation, or joint effusion. Normal joint spaces and alignment. No evidence of arthropathy or other focal bone abnormality. Soft tissues are unremarkable. IMPRESSION: Negative radiographs of the left knee. Electronically Signed   By: Narda Rutherford M.D.   On: 10/02/2022 17:10   DG Chest 2 View  Result Date: 10/02/2022 CLINICAL DATA:  Cough, sore throat EXAM: CHEST - 2 VIEW COMPARISON:  08/06/2021 FINDINGS: Frontal and lateral views of the chest demonstrate an unremarkable cardiac silhouette. No airspace disease, effusion, or pneumothorax. No acute bony abnormalities. IMPRESSION: 1. No acute intrathoracic process. Electronically Signed   By: Sharlet Salina M.D.   On: 10/02/2022 16:16    Procedures Procedures    Medications Ordered in ED Medications  ibuprofen (ADVIL) tablet 600 mg (600 mg Oral Given 10/02/22 1558)    ED Course/ Medical Decision Making/ A&P                           Medical Decision Making Amount and/or Complexity of Data Reviewed Radiology: ordered.  Risk OTC drugs.   This patient presents to the ED for concern of flulike symptoms, this involves an extensive number of treatment options, and is a complaint that carries with it a high risk of complications and morbidity.  The differential diagnosis includes influenza, COVID, RSV, pneumonia, sepsis, meningitis   Co morbidities that complicate the patient evaluation  See HPI   Additional history obtained:  Additional history obtained from EMR External records from outside source obtained and reviewed including hospital records   Lab Tests:  I Ordered, and personally interpreted labs.  The pertinent results include: Respiratory viral panel positive for RSV.  Negative for influenza B, a and coronavirus.   Imaging Studies ordered:  I ordered imaging studies including  left knee x-ray and chest x-ray I independently visualized and interpreted imaging which showed  Chest x-ray: No acute cardiopulmonary abnormalities Left knee x-ray: No acute abnormalities. I agree with the radiologist interpretation   Cardiac Monitoring: / EKG:  The patient was maintained on a cardiac monitor.  I personally viewed and interpreted the cardiac monitored which showed an underlying rhythm of: Sinus rhythm   Consultations Obtained:  N/a   Problem List / ED Course / Critical interventions / Medication management  RSV I ordered medication including ibuprofen for myalgias/arthralgias Reevaluation of the patient after these medicines showed that the patient improved I have reviewed the patients home medicines and have made adjustments as needed   Social Determinants of Health:  Denies tobacco,  licit drug use   Test / Admission - Considered:  RSV Vitals signs within normal range and stable throughout visit. Laboratory/imaging studies significant for: See above Patient symptoms most likely secondary to RSV infection as indicated above.  Patient no acute respiratory distress and no obvious signs of pneumonia.  Upon conversation of discharge, patient complaining of left knee pain from a prior fall which x-ray was ordered which was negative.  Patient placed in left knee sleeve for continued support.  Symptomatic therapy recommended home with Tylenol/Motrin as needed for myalgias/arthralgias/fever, Nasacort/Flonase for congestion, daily antihistamine.  Recommend follow-up with primary care in 3 to 5 days.  Treatment plan discussed with the patient and she cannot understand was agreeable to said plan. Worrisome signs and symptoms were discussed with the patient, and the patient acknowledged understanding to return to the ED if noticed. Patient was stable upon discharge.          Final Clinical Impression(s) / ED Diagnoses Final diagnoses:  RSV (respiratory syncytial  virus infection)    Rx / DC Orders ED Discharge Orders     None         Peter Garter, [place] 10/02/22 2326    Benjiman Core, MD 10/03/22 1454

## 2022-10-02 NOTE — ED Triage Notes (Signed)
Pt reports cough and body aches x few days.

## 2022-11-28 ENCOUNTER — Encounter (HOSPITAL_COMMUNITY): Payer: Self-pay

## 2022-11-28 ENCOUNTER — Ambulatory Visit (INDEPENDENT_AMBULATORY_CARE_PROVIDER_SITE_OTHER): Payer: Medicaid Other

## 2022-11-28 ENCOUNTER — Ambulatory Visit (HOSPITAL_COMMUNITY)
Admission: EM | Admit: 2022-11-28 | Discharge: 2022-11-28 | Disposition: A | Discharge status: Home / Self Care | Payer: Medicaid Other | Attending: Physician Assistant | Admitting: Physician Assistant

## 2022-11-28 DIAGNOSIS — J329 Chronic sinusitis, unspecified: Secondary | ICD-10-CM

## 2022-11-28 DIAGNOSIS — J40 Bronchitis, not specified as acute or chronic: Secondary | ICD-10-CM | POA: Diagnosis not present

## 2022-11-28 DIAGNOSIS — R059 Cough, unspecified: Secondary | ICD-10-CM

## 2022-11-28 DIAGNOSIS — R079 Chest pain, unspecified: Secondary | ICD-10-CM

## 2022-11-28 DIAGNOSIS — R051 Acute cough: Secondary | ICD-10-CM

## 2022-11-28 DIAGNOSIS — R0602 Shortness of breath: Secondary | ICD-10-CM | POA: Diagnosis not present

## 2022-11-28 MED ORDER — PREDNISONE 20 MG PO TABS: 40.0000 mg | ORAL_TABLET | Freq: Every day | ORAL | 0 refills | Status: AC

## 2022-11-28 MED ORDER — AMOXICILLIN-POT CLAVULANATE 875-125 MG PO TABS: 1.0000 | ORAL_TABLET | Freq: Two times a day (BID) | ORAL | 0 refills | Status: DC

## 2022-11-28 MED ORDER — PROMETHAZINE-DM 6.25-15 MG/5ML PO SYRP: 5.0000 mL | ORAL_SOLUTION | Freq: Two times a day (BID) | ORAL | 0 refills | Status: DC | PRN

## 2022-11-28 NOTE — ED Triage Notes (Signed)
Patient reports that she has been having nausea, headache, sore throat, nasal congestion, a productive cough with green sputum, neck and back pain x 1 week.  Patient states she has taken OTC cough medicines, Tylenol, Ibuprofen, and an antibiotic she took from a dental issue previously.

## 2022-11-28 NOTE — Discharge Instructions (Signed)
Your x-ray did not show any evidence of pneumonia.  Please start Augmentin twice daily for 7 days to cover for bacterial infection.  Take prednisone 40 mg for 4 days.  Do not take NSAIDs with this medication including aspirin, ibuprofen/Advil, naproxen/Aleve.  Use Promethazine DM for cough.  This will make you sleepy so do not drive or drink alcohol while taking it.  Make sure that you rest and drink plenty of fluid.  Antibiotics can decrease the effectiveness of your birth control so please use backup birth control while on this medication.  If your symptoms or not improving within 3 to 5 days please return for reevaluation.  If anything worsens and you have worsening chest discomfort, shortness of breath, fever not responding to medication, worsening cough, weakness you need to be seen immediately.

## 2022-11-28 NOTE — ED Provider Notes (Signed)
[facility]    CSN: [id number] Arrival date & time: 11/28/22  0831      History   Chief Complaint Chief Complaint  Patient presents with   Headache   Fever   Nasal Congestion   Cough   Sore Throat    HPI Laura Hernandez is a 32 y.o. female.   Patient presents today with a week plus long history of URI symptoms including cough, congestion, chest tightness, shortness of breath.  Reports back and body pain as well as a headache.  Cough is productive with thick purulent sputum.  Reports diarrhea that is since resolved but he does have ongoing nausea.  Denies any abdominal pain, vomiting, chest pain, generalized weakness.  She has tried multiple over-the-counter medications including Robitussin, Tylenol, ibuprofen.  She did have a few doses of amoxicillin leftover from previous dental prescription that she took without improvement of symptoms.  Denies additional antibiotics or steroids in the past 90 days.  She denies history of allergies, asthma, COPD.  She does have an inhaler at home from previous illness that she has been using with only temporary relief of symptoms.  She does not smoke.  She is confident she is not pregnant and is not breast-feeding.  She has not had COVID in the past.  She has had COVID-19 vaccinations.  She is having difficulty sleeping at night as a result of symptoms.    Past Medical History:  Diagnosis Date   Bronchitis    Chlamydia    Gestational diabetes    History of gonorrhea 09/18/2013   2012     History of trichomonal vaginitis 09/18/2013   2012     Migraines 2012   Trichomonas     Patient Active Problem List   Diagnosis Date Noted   Cesarean delivery delivered 09/21/2020   GDM, class A2 09/20/2020   Positive GBS test 09/20/2020   Sciatic leg pain 09/09/2020   Vaginal bleeding in pregnancy 09/09/2020   BMI 40.0-44.9, adult (HCC) 08/25/2020   Obesity in pregnancy 08/25/2020   Pruritus 08/25/2020   Gestational diabetes mellitus (GDM)  in third trimester 08/04/2020   Iron deficiency anemia 08/04/2020   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 08/04/2020   Situational mixed anxiety and depressive disorder 07/01/2020   Back pain affecting pregnancy, antepartum 07/01/2020   Trichomonas infection 06/05/2020   Urinary tract colonization by group B Streptococcus affecting pregnancy 03/31/2020   Supervision of high risk pregnancy, antepartum 12/06/2013   HSV-2 (herpes simplex virus 2) infection 09/18/2013    Past Surgical History:  Procedure Laterality Date   CESAREAN SECTION N/A 09/21/2020   Procedure: CESAREAN SECTION;  Surgeon: Buckingham Bing, MD;  Location: [facility];  Service: Obstetrics;  Laterality: N/A;    OB History     Gravida  2   Para  1   Term  1   Preterm      AB  1   Living  1      SAB  1   IAB      Ectopic      Multiple  0   Live Births  1            Home Medications    Prior to Admission medications   Medication Sig Start Date End Date Taking? Authorizing Provider  amoxicillin-clavulanate (AUGMENTIN) 875-125 MG tablet Take 1 tablet by mouth every 12 (twelve) hours. 11/28/22  Yes Anagha Loseke K, PA-C  predniSONE (DELTASONE) 20 MG tablet Take  2 tablets (40 mg total) by mouth daily for 4 days. 11/28/22 12/02/22 Yes Yacine Garriga K, PA-C  promethazine-dextromethorphan (PROMETHAZINE-DM) 6.25-15 MG/5ML syrup Take 5 mLs by mouth 2 (two) times daily as needed for cough. 11/28/22  Yes Alixandra Alfieri K, PA-C  Biotin w/ Vitamins C & E (HAIR/SKIN/NAILS PO) Take 1 capsule by mouth daily.    [provider]  butalbital-acetaminophen-caffeine (FIORICET) 50-325-40 MG tablet Take 1-2 tablets by mouth every 6 (six) hours as needed for headache. 07/28/22 07/28/23  Lacretia Leigh, MD  cetirizine (ZYRTEC) 10 MG tablet Take 10 mg by mouth daily. 07/06/22   [provider]  cyclobenzaprine (FLEXERIL) 5 MG tablet Take 1 tablet (5 mg total) by mouth 3 (three) times daily as  needed for muscle spasms. Do not drink alcohol or drive while taking this medication. May cause drowsiness Patient taking differently: Take 5 mg by mouth 3 (three) times daily as needed for muscle spasms. 05/21/21   Volney American, PA-C  TRI-SPRINTEC 0.18/0.215/0.25 MG-35 MCG tablet Take 1 tablet by mouth daily. 07/06/22   [provider]    Family History Family History  Problem Relation Age of Onset   Asthma Mother    Arthritis Mother    Diabetes Mother    Heart disease Mother    Hypertension Mother    Stroke Mother    Kidney disease Mother    Vision loss Mother    Diabetes Maternal Grandmother    Hypertension Maternal Grandmother     Social History Social History   Tobacco Use   Smoking status: Never   Smokeless tobacco: Never  Vaping Use   Vaping Use: Never used  Substance Use Topics   Alcohol use: No   Drug use: No     Allergies   Latex   Review of Systems Review of Systems  Constitutional:  Positive for activity change, fatigue and fever (resolved). Negative for appetite change.  HENT:  Positive for congestion and sore throat. Negative for sinus pressure and sneezing.   Respiratory:  Positive for cough, chest tightness and shortness of breath. Negative for wheezing.   Cardiovascular:  Negative for chest pain.  Gastrointestinal:  Positive for diarrhea (Resolved) and nausea. Negative for abdominal pain and vomiting.  Neurological:  Positive for headaches. Negative for dizziness and light-headedness.     Physical Exam Triage Vital Signs ED Triage Vitals  Enc Vitals Group     BP 11/28/22 0932 118/77     Pulse Rate 11/28/22 0932 63     Resp 11/28/22 0932 16     Temp 11/28/22 0932 99 F (37.2 C)     Temp Source 11/28/22 0932 Oral     SpO2 11/28/22 0932 96 %     Weight --      Height --      Head Circumference --      Peak Flow --      Pain Score 11/28/22 0935 9     Pain Loc --      Pain Edu? --      Excl. in [place]? --    No data  found.  Updated Vital Signs BP 118/77 (BP Location: Right Arm)   Pulse 63   Temp 99 F (37.2 C) (Oral)   Resp 16   LMP 11/21/2022   SpO2 96%   Visual Acuity Right Eye Distance:   Left Eye Distance:   Bilateral Distance:    Right Eye Near:   Left Eye Near:    Bilateral  Near:     Physical Exam Vitals reviewed.  Constitutional:      General: She is awake. She is not in acute distress.    Appearance: Normal appearance. She is well-developed. She is not ill-appearing.     Comments: Very pleasant female appears stated age in no acute distress sitting comfortably in exam room  HENT:     Head: Normocephalic and atraumatic.     Right Ear: Tympanic membrane, ear canal and external ear normal. Tympanic membrane is not erythematous or bulging.     Left Ear: Ear canal and external ear normal. Tympanic membrane is injected. Tympanic membrane is not erythematous or bulging.     Nose: Congestion present.     Right Sinus: No maxillary sinus tenderness or frontal sinus tenderness.     Left Sinus: No maxillary sinus tenderness or frontal sinus tenderness.     Mouth/Throat:     Pharynx: Uvula midline. No oropharyngeal exudate or posterior oropharyngeal erythema.  Cardiovascular:     Rate and Rhythm: Normal rate and regular rhythm.     Heart sounds: Normal heart sounds, S1 normal and S2 normal. No murmur heard. Pulmonary:     Effort: Pulmonary effort is normal.     Breath sounds: Examination of the right-lower field reveals decreased breath sounds. Examination of the left-lower field reveals decreased breath sounds. Decreased breath sounds present. No wheezing, rhonchi or rales.     Comments: Decreased aeration bilateral bases.  Reactive cough with deep breathing. Psychiatric:        Behavior: Behavior is cooperative.      UC Treatments / Results  Labs (all labs ordered are listed, but only abnormal results are displayed) Labs Reviewed - No data to display  EKG   Radiology DG  Chest 2 View  Result Date: 11/28/2022 CLINICAL DATA:  worsening cough with associated chest tightness, and SOB EXAM: CHEST - 2 VIEW COMPARISON:  10/02/2022 chest radiograph. FINDINGS: Stable cardiomediastinal silhouette with normal heart size. No pneumothorax. No pleural effusion. Lungs appear clear, with no acute consolidative airspace disease and no pulmonary edema. IMPRESSION: No active cardiopulmonary disease. Electronically Signed   By: Ilona Sorrel M.D.   On: 11/28/2022 10:15    Procedures Procedures (including critical care time)  Medications Ordered in UC Medications - No data to display  Initial Impression / Assessment and Plan / UC Course  I have reviewed the triage vital signs and the nursing notes.  Pertinent labs & imaging results that were available during my care of the patient were reviewed by me and considered in my medical decision making (see chart for details).     Patient is well-appearing, afebrile, nontoxic, nontachycardic.  No indication for viral testing as she has been symptomatic for over a week and this would not change management.  Chest x-ray was obtained given persistent cough/shortness of breath which showed no acute cardiopulmonary disease.  Will cover for secondary bacterial infection given prolonged and worsening symptoms with Augmentin twice daily for 7 days.  Patient was also given a prednisone burst of 40 mg for 4 days with instruction not to take NSAIDs with this medication due to risk of GI bleeding.  Can use acetaminophen/Tylenol as well as additional over-the-counter medication such as Mucinex and Flonase to manage her symptoms.  Promethazine DM was sent for cough.  Discussed this can be sedating and she is not to drive or drink alcohol while taking it.  If her symptoms do not improving within 3 to 5 days  she is to return for reevaluation.  If she has any worsening symptoms including chest pain, shortness of breath, fever not responding to medication,  weakness, nausea/vomiting interfering with oral intake she needs to be seen immediately.  Strict return precautions given.  Work excuse note provided.  Final Clinical Impressions(s) / UC Diagnoses   Final diagnoses:  Sinobronchitis  Acute cough     Discharge Instructions      Your x-ray did not show any evidence of pneumonia.  Please start Augmentin twice daily for 7 days to cover for bacterial infection.  Take prednisone 40 mg for 4 days.  Do not take NSAIDs with this medication including aspirin, ibuprofen/Advil, naproxen/Aleve.  Use Promethazine DM for cough.  This will make you sleepy so do not drive or drink alcohol while taking it.  Make sure that you rest and drink plenty of fluid.  Antibiotics can decrease the effectiveness of your birth control so please use backup birth control while on this medication.  If your symptoms or not improving within 3 to 5 days please return for reevaluation.  If anything worsens and you have worsening chest discomfort, shortness of breath, fever not responding to medication, worsening cough, weakness you need to be seen immediately.     ED Prescriptions     Medication Sig Dispense Auth. Provider   amoxicillin-clavulanate (AUGMENTIN) 875-125 MG tablet Take 1 tablet by mouth every 12 (twelve) hours. 14 tablet Ouita Nish K, PA-C   predniSONE (DELTASONE) 20 MG tablet Take 2 tablets (40 mg total) by mouth daily for 4 days. 8 tablet Kemari Mares K, PA-C   promethazine-dextromethorphan (PROMETHAZINE-DM) 6.25-15 MG/5ML syrup Take 5 mLs by mouth 2 (two) times daily as needed for cough. 118 mL Issabella Rix K, PA-C      PDMP not reviewed this encounter.   Terrilee Croak, PA-C 11/28/22 1028

## 2022-12-17 ENCOUNTER — Other Ambulatory Visit: Payer: Self-pay | Admitting: Obstetrics & Gynecology

## 2022-12-17 DIAGNOSIS — N898 Other specified noninflammatory disorders of vagina: Secondary | ICD-10-CM

## 2023-02-22 ENCOUNTER — Ambulatory Visit: Payer: Medicaid Other | Admitting: Obstetrics and Gynecology

## 2023-03-02 ENCOUNTER — Ambulatory Visit (INDEPENDENT_AMBULATORY_CARE_PROVIDER_SITE_OTHER): Payer: Medicaid Other | Admitting: Advanced Practice Midwife

## 2023-03-02 ENCOUNTER — Encounter: Payer: Self-pay | Admitting: Advanced Practice Midwife

## 2023-03-02 ENCOUNTER — Other Ambulatory Visit (HOSPITAL_COMMUNITY)
Admission: RE | Admit: 2023-03-02 | Discharge: 2023-03-02 | Disposition: A | Discharge status: Home / Self Care | Payer: Medicaid Other | Source: Ambulatory Visit | Attending: Obstetrics and Gynecology | Admitting: Obstetrics and Gynecology

## 2023-03-02 VITALS — BP 122/72 | Wt 218.0 lb

## 2023-03-02 DIAGNOSIS — Z113 Encounter for screening for infections with a predominantly sexual mode of transmission: Secondary | ICD-10-CM

## 2023-03-02 DIAGNOSIS — Z01419 Encounter for gynecological examination (general) (routine) without abnormal findings: Secondary | ICD-10-CM

## 2023-03-02 DIAGNOSIS — Z0000 Encounter for general adult medical examination without abnormal findings: Secondary | ICD-10-CM

## 2023-03-02 DIAGNOSIS — Z3202 Encounter for pregnancy test, result negative: Secondary | ICD-10-CM | POA: Diagnosis not present

## 2023-03-02 LAB — POCT URINE PREGNANCY: Preg Test, Ur: NEGATIVE

## 2023-03-02 NOTE — Progress Notes (Signed)
  CC: Sharp pains in vaginal area

## 2023-03-03 LAB — RPR+HBSAG+HCVAB+...
HIV Screen 4th Generation wRfx: NONREACTIVE
Hep C Virus Ab: NONREACTIVE
Hepatitis B Surface Ag: NEGATIVE
RPR Ser Ql: NONREACTIVE

## 2023-03-04 LAB — URINE CULTURE

## 2023-03-04 NOTE — Progress Notes (Signed)
GYNECOLOGY ANNUAL PREVENTATIVE CARE ENCOUNTER NOTE  History:     Laura Hernandez is a 32 y.o. G2P1011 female here for a routine annual gynecologic exam.  Current complaints: none.   Denies abnormal vaginal bleeding, discharge, pelvic pain, problems with intercourse or other gynecologic concerns.  She reports regular periods but was slightly off schedule last month. This is not a concern for her.  Patient states her PCP recently started her Metformin due to elevated blood work. She is compliant with this medication.   Patient is in the early stages of considering being a surrogate for her sister. 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 She is currently working as a Psychologist, clinical.    Gynecologic History Patient's last menstrual period was 02/12/2023 (approximate). Contraception: none Last Pap: 2023 with PCP per patient report. Result was normal with negative HPV   Obstetric History OB History  Gravida Para Term Preterm AB Living  2 1 1   1 1   SAB IAB Ectopic Multiple Live Births  1     0 1    # Outcome Date GA Lbr Len/2nd Weight Sex Delivery Anes PTL Lv  2 Term 09/21/20 [redacted]w[redacted]d  6 lb 10.7 oz (3.025 kg) M CS-LTranv EPI  LIV  1 SAB 07/2013            Past Medical History:  Diagnosis Date   Bronchitis    Chlamydia    Gestational diabetes    History of gonorrhea 09/18/2013   2012     History of trichomonal vaginitis 09/18/2013   2012     Migraines 2012   Trichomonas     Past Surgical History:  Procedure Laterality Date   CESAREAN SECTION N/A 09/21/2020   Procedure: CESAREAN SECTION;  Surgeon: [place] Bing, MD;  Location: [facility];  Service: Obstetrics;  Laterality: N/A;    Current Outpatient Medications on File Prior to Visit  Medication Sig Dispense Refill   Biotin w/ Vitamins C & E (HAIR/SKIN/NAILS PO) Take 1 capsule by mouth daily.     butalbital-acetaminophen-caffeine (FIORICET) 50-325-40 MG tablet Take 1-2  tablets by mouth every 6 (six) hours as needed for headache. 20 tablet 0   cetirizine (ZYRTEC) 10 MG tablet Take 10 mg by mouth daily.     cyclobenzaprine (FLEXERIL) 5 MG tablet Take 1 tablet (5 mg total) by mouth 3 (three) times daily as needed for muscle spasms. Do not drink alcohol or drive while taking this medication. May cause drowsiness (Patient taking differently: Take 5 mg by mouth 3 (three) times daily as needed for muscle spasms.) 15 tablet 0   Prenatal Vit-Fe Fumarate-FA (PRENATAL VITAMIN PO) Take by mouth.     amoxicillin-clavulanate (AUGMENTIN) 875-125 MG tablet Take 1 tablet by mouth every 12 (twelve) hours. (Patient not taking: Reported on 03/02/2023) 14 tablet 0   promethazine-dextromethorphan (PROMETHAZINE-DM) 6.25-15 MG/5ML syrup Take 5 mLs by mouth 2 (two) times daily as needed for cough. (Patient not taking: Reported on 03/02/2023) 118 mL 0   TRI-SPRINTEC 0.18/0.215/0.25 MG-35 MCG tablet Take 1 tablet by mouth daily. (Patient not taking: Reported on 03/02/2023)     No current facility-administered medications on file prior to visit.    Allergies  Allergen Reactions   Latex Itching and Rash    Social History:  reports that she has never smoked. She has never used smokeless tobacco. She reports that she does not drink alcohol and does not use  drugs.  Family History  Problem Relation Age of Onset   Asthma Mother    Arthritis Mother    Diabetes Mother    Heart disease Mother    Hypertension Mother    Stroke Mother    Kidney disease Mother    Vision loss Mother    Diabetes Maternal Grandmother    Hypertension Maternal Grandmother     The following portions of the patient's history were reviewed and updated as appropriate: allergies, current medications, past family history, past medical history, past social history, past surgical history and problem list.  Review of Systems Pertinent items noted in HPI and remainder of comprehensive ROS otherwise negative.  Physical  Exam:  BP 122/72   Wt 218 lb (98.9 kg)   LMP 02/12/2023 (Approximate)   BMI 42.58 kg/m  CONSTITUTIONAL: Well-developed, well-nourished female in no acute distress.  HENT:  Normocephalic, atraumatic, External right and left ear normal.  EYES: Conjunctivae and EOM are normal. Pupils are equal, round, and reactive to light. No scleral icterus.  SKIN: Skin is warm and dry. No rash noted. Not diaphoretic. No erythema. No pallor. MUSCULOSKELETAL: Normal range of motion. No tenderness.  No cyanosis, clubbing, or edema. NEUROLOGIC: Alert and oriented to person, place, and time. Normal reflexes, muscle tone coordination.  PSYCHIATRIC: Normal mood and affect. Normal behavior. Normal judgment and thought content. CARDIOVASCULAR: Normal heart rate noted, regular rhythm RESPIRATORY: Clear to auscultation bilaterally. Effort and breath sounds normal, no problems with respiration noted. ABDOMEN: Soft, no distention noted.  No tenderness, rebound or guarding.    Assessment and Plan:    1. Well woman exam without gynecological exam - Pap not due, performed by PCP last year - Will request records  2. Screening examination for STI  - POCT urine pregnancy - RPR+HBsAg+HCVAb+... - Cervicovaginal ancillary only - Urine Culture   Routine preventative health maintenance measures emphasized. Please refer to After Visit Summary for other counseling recommendations.      Clayton Bibles, MSA, MSN, CNM Certified Nurse Midwife, Biochemist, clinical for [employer], [facility]

## 2023-03-06 LAB — CERVICOVAGINAL ANCILLARY ONLY
Bacterial Vaginitis (gardnerella): POSITIVE — AB
Candida Glabrata: NEGATIVE
Candida Vaginitis: NEGATIVE
Chlamydia: NEGATIVE
Comment: NEGATIVE
Comment: NEGATIVE
Comment: NEGATIVE
Comment: NEGATIVE
Comment: NEGATIVE
Comment: NORMAL
Neisseria Gonorrhea: NEGATIVE
Trichomonas: NEGATIVE

## 2023-03-08 ENCOUNTER — Other Ambulatory Visit: Payer: Self-pay | Admitting: *Deleted

## 2023-03-08 MED ORDER — METRONIDAZOLE 500 MG PO TABS: 500.0000 mg | ORAL_TABLET | Freq: Two times a day (BID) | ORAL | 0 refills | Status: AC

## 2023-04-05 ENCOUNTER — Ambulatory Visit (HOSPITAL_BASED_OUTPATIENT_CLINIC_OR_DEPARTMENT_OTHER): Payer: Medicaid Other | Admitting: Physical Therapy

## 2023-04-11 ENCOUNTER — Ambulatory Visit (HOSPITAL_BASED_OUTPATIENT_CLINIC_OR_DEPARTMENT_OTHER): Payer: Medicaid Other | Admitting: Physical Therapy

## 2023-05-10 ENCOUNTER — Ambulatory Visit (HOSPITAL_BASED_OUTPATIENT_CLINIC_OR_DEPARTMENT_OTHER): Payer: Medicaid Other | Attending: Physician Assistant | Admitting: Physical Therapy

## 2023-05-10 NOTE — Therapy (Deleted)
OUTPATIENT PHYSICAL THERAPY LOWER EXTREMITY EVALUATION   Patient Name: Laura Hernandez MRN: [id number] DOB:09-19-1991, 32 y.o., female Today's Date: 05/10/2023  END OF SESSION:   Past Medical History:  Diagnosis Date   Bronchitis    Chlamydia    Gestational diabetes    History of gonorrhea 09/18/2013   2012     History of trichomonal vaginitis 09/18/2013   2012     Migraines 2012   Trichomonas    Past Surgical History:  Procedure Laterality Date   CESAREAN SECTION N/A 09/21/2020   Procedure: CESAREAN SECTION;  Surgeon: Habers[place] Bing, MD;  Location: [facility];  Service: Obstetrics;  Laterality: N/A;   Patient Active Problem List   Diagnosis Date Noted   Cesarean delivery delivered 09/21/2020   GDM, class A2 09/20/2020   Positive GBS test 09/20/2020   Sciatic leg pain 09/09/2020   Vaginal bleeding in pregnancy 09/09/2020   BMI 40.0-44.9, adult (HCC) 08/25/2020   Obesity in pregnancy 08/25/2020   Pruritus 08/25/2020   Gestational diabetes mellitus (GDM) in third trimester 08/04/2020   Iron deficiency anemia 08/04/2020   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  Rationale for Evaluation and Treatment: Rehabilitation  ONSET DATE: ***  SUBJECTIVE:   SUBJECTIVE STATEMENT: ***  PERTINENT HISTORY: *** PAIN:  Are you having pain? {OPRCPAIN:27236}  PRECAUTIONS: {Therapy  precautions:24002}  RED FLAGS: {PT Red Flags:29287}   WEIGHT BEARING RESTRICTIONS: {Yes ***/No:24003}  FALLS:  Has patient fallen in last 6 months? {fallsyesno:27318}  LIVING ENVIRONMENT: Lives with: {OPRC lives with:25569::"lives with their family"} Lives in: {Lives in:25570} Stairs: {opstairs:27293} Has following equipment at home: {Assistive devices:23999}  OCCUPATION: ***  PLOF: {PLOF:24004}  PATIENT GOALS: ***  NEXT MD VISIT: ***  OBJECTIVE:   DIAGNOSTIC FINDINGS: ***  PATIENT SURVEYS:  {rehab surveys:24030}  COGNITION: Overall cognitive status: {cognition:24006}     SENSATION: {sensation:27233}  EDEMA:  {edema:24020}  MUSCLE LENGTH: Hamstrings: Right *** deg; Left *** deg Thomas test: Right *** deg; Left *** deg  POSTURE: {posture:25561}  PALPATION: ***  LOWER EXTREMITY ROM:  {AROM/PROM:27142} ROM Right eval Left eval  Hip flexion    Hip extension    Hip abduction    Hip adduction    Hip internal rotation    Hip external rotation    Knee flexion    Knee extension    Ankle dorsiflexion    Ankle plantarflexion    Ankle inversion    Ankle eversion     (Blank rows = not tested)  LOWER EXTREMITY MMT:  MMT Right eval Left eval  Hip flexion    Hip extension    Hip abduction    Hip adduction    Hip internal rotation    Hip external rotation    Knee flexion    Knee extension    Ankle dorsiflexion    Ankle plantarflexion    Ankle inversion    Ankle eversion     (Blank rows =  not tested)  LOWER EXTREMITY SPECIAL TESTS:  {LEspecialtests:26242}  FUNCTIONAL TESTS:  {Functional tests:24029}  GAIT: Distance walked: *** Assistive device utilized: {Assistive devices:23999} Level of assistance: {Levels of assistance:24026} Comments: ***   TODAY'S TREATMENT:                                                                                                                              DATE: ***    PATIENT EDUCATION:  Education  details: *** Person educated: {Person educated:25204} Education method: {Education Method:25205} Education comprehension: {Education Comprehension:25206}  HOME EXERCISE PROGRAM: ***  ASSESSMENT:  CLINICAL IMPRESSION:  Patient is a *** y.o. *** who was seen today for physical therapy evaluation and treatment for c/c of ***. Pt's s/s appear consistent with ***. Pt's pain is *** sensitive and irritable with movement. Pt's is more *** dominant at this time. Plan to continue with *** and *** at future sessions. Pt would benefit from continued skilled therapy in order to reach goals and maximize functional *** strength and ROM for full return to PLOF. Marland Kitchen   OBJECTIVE IMPAIRMENTS: {opptimpairments:25111}.   ACTIVITY LIMITATIONS: {activitylimitations:27494}  PARTICIPATION LIMITATIONS: {participationrestrictions:25113}  PERSONAL FACTORS: {Personal factors:25162} are also affecting patient's functional outcome.   REHAB POTENTIAL: {rehabpotential:25112}  CLINICAL DECISION MAKING: {clinical decision making:25114}  EVALUATION COMPLEXITY: {Evaluation complexity:25115}   GOALS:   SHORT TERM GOALS: Target date: 06/21/2023  *** Baseline: Goal status: INITIAL  2.  *** Baseline:  Goal status: INITIAL  3.  *** Baseline:  Goal status: INITIAL   LONG TERM GOALS: Target date: 08/02/2023   *** Baseline:  Goal status: INITIAL  2.  *** Baseline:  Goal status: INITIAL  3.  *** Baseline:  Goal status: INITIAL  4.  *** Baseline:  Goal status: INITIAL  PLAN:  PT FREQUENCY: 1-2x/week  PT DURATION: 12 weeks  PLANNED INTERVENTIONS: {rehab planned interventions:25118::"Therapeutic exercises","Therapeutic activity","Neuromuscular re-education","Balance training","Gait training","Patient/Family education","Self Care","Joint mobilization"}  PLAN FOR NEXT SESSION: ***   Zebedee Iba, PT 05/10/2023, 9:22 AM

## 2023-05-19 ENCOUNTER — Encounter (HOSPITAL_COMMUNITY): Payer: Self-pay

## 2023-05-19 ENCOUNTER — Ambulatory Visit (HOSPITAL_COMMUNITY)
Admission: EM | Admit: 2023-05-19 | Discharge: 2023-05-19 | Disposition: A | Discharge status: Home / Self Care | Payer: Medicaid Other

## 2023-05-19 DIAGNOSIS — U071 COVID-19: Secondary | ICD-10-CM | POA: Diagnosis not present

## 2023-05-19 DIAGNOSIS — J069 Acute upper respiratory infection, unspecified: Secondary | ICD-10-CM | POA: Diagnosis not present

## 2023-05-19 MED ORDER — PROMETHAZINE-DM 6.25-15 MG/5ML PO SYRP: 5.0000 mL | ORAL_SOLUTION | Freq: Four times a day (QID) | ORAL | 0 refills | Status: DC | PRN

## 2023-05-19 MED ORDER — AEROCHAMBER PLUS FLO-VU LARGE MISC
Status: AC
  Filled 2023-05-19: qty 1

## 2023-05-19 MED ORDER — ALBUTEROL SULFATE HFA 108 (90 BASE) MCG/ACT IN AERS
2.0000 | INHALATION_SPRAY | Freq: Once | RESPIRATORY_TRACT | Status: AC
  Administered 2023-05-19: 2 via RESPIRATORY_TRACT

## 2023-05-19 MED ORDER — IBUPROFEN 800 MG PO TABS
800.0000 mg | ORAL_TABLET | Freq: Once | ORAL | Status: AC
  Administered 2023-05-19: 800 mg via ORAL

## 2023-05-19 MED ORDER — IBUPROFEN 800 MG PO TABS
ORAL_TABLET | ORAL | Status: AC
  Filled 2023-05-19: qty 1

## 2023-05-19 MED ORDER — ALBUTEROL SULFATE HFA 108 (90 BASE) MCG/ACT IN AERS
INHALATION_SPRAY | RESPIRATORY_TRACT | Status: AC
  Filled 2023-05-19: qty 6.7

## 2023-05-19 MED ORDER — AEROCHAMBER PLUS FLO-VU LARGE MISC
1.0000 | Freq: Once | Status: AC
  Administered 2023-05-19: 1

## 2023-05-19 MED ORDER — CYCLOBENZAPRINE HCL 10 MG PO TABS: 10.0000 mg | ORAL_TABLET | Freq: Two times a day (BID) | ORAL | 0 refills | Status: DC | PRN

## 2023-05-19 MED ORDER — AEROCHAMBER PLUS FLO-VU MEDIUM MISC: 1.0000 | Freq: Once | Status: DC

## 2023-05-19 NOTE — Discharge Instructions (Addendum)
Please continue alternating tylenol and ibuprofen every 4-6 hours for pain and fever  You can take the muscle relaxer Flexeril twice daily. If the medication makes you drowsy, take only at bed time. This may help with body aches caused by fever  Use the inhaler every 6 hours for the next several days  The cough syrup can be used up to 4 times daily. This may also make you drowsy!   It can take a week for symptoms to improve. Please continue symptomatic care and get lots of rest. Drink as much fluids as you can.

## 2023-05-19 NOTE — ED Provider Notes (Signed)
[facility]    CSN: [id number] Arrival date & time: 05/19/23  1413     History   Chief Complaint Chief Complaint  Patient presents with   Cough    HPI Laura Hernandez is a 32 y.o. female.  Here with 1 day history of cough, congestion, headache Fever, body aches, ear pain She tested positive for covid at home. Having discomfort in the chest with deep breath. No shortness of breath. Denies history of asthma or COPD  Has tried tylenol and Excedrin, last dose 1 hour ago  Her son is covid positive too, going around his daycare   Past Medical History:  Diagnosis Date   Bronchitis    Chlamydia    Gestational diabetes    History of gonorrhea 09/18/2013   2012     History of trichomonal vaginitis 09/18/2013   2012     Migraines 2012   Trichomonas     Patient Active Problem List   Diagnosis Date Noted   Cesarean delivery delivered 09/21/2020   GDM, class A2 09/20/2020   Positive GBS test 09/20/2020   Sciatic leg pain 09/09/2020   Vaginal bleeding in pregnancy 09/09/2020   BMI 40.0-44.9, adult (HCC) 08/25/2020   Obesity in pregnancy 08/25/2020   Pruritus 08/25/2020   Gestational diabetes mellitus (GDM) in third trimester 08/04/2020   Iron deficiency anemia 08/04/2020   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 08/04/2020   Situational mixed anxiety and depressive disorder 07/01/2020   Back pain affecting pregnancy, antepartum 07/01/2020   Trichomonas infection 06/05/2020   Urinary tract colonization by group B Streptococcus affecting pregnancy 03/31/2020   Supervision of high risk pregnancy, antepartum 12/06/2013   HSV-2 (herpes simplex virus 2) infection 09/18/2013    Past Surgical History:  Procedure Laterality Date   CESAREAN SECTION N/A 09/21/2020   Procedure: CESAREAN SECTION;  Surgeon: Exmore Bing, MD;  Location: [facility];  Service: Obstetrics;  Laterality: N/A;    OB History     Gravida  2   Para  1   Term  1    Preterm      AB  1   Living  1      SAB  1   IAB      Ectopic      Multiple  0   Live Births  1            Home Medications    Prior to Admission medications   Medication Sig Start Date End Date Taking? Authorizing Provider  Biotin w/ Vitamins C & E (HAIR/SKIN/NAILS PO) Take 1 capsule by mouth daily.   Yes [provider]  cetirizine (ZYRTEC) 10 MG tablet Take 10 mg by mouth daily. 07/06/22  Yes [provider]  cyclobenzaprine (FLEXERIL) 10 MG tablet Take 1 tablet (10 mg total) by mouth 2 (two) times daily as needed for muscle spasms. 05/19/23  Yes Alexxander Kurt, Lurena Joiner, PA-C  metformin (FORTAMET) 500 MG (OSM) 24 hr tablet Take 500 mg by mouth at bedtime.   Yes [provider]  Prenatal Vit-Fe Fumarate-FA (PRENATAL VITAMIN PO) Take by mouth.   Yes [provider]  promethazine-dextromethorphan (PROMETHAZINE-DM) 6.25-15 MG/5ML syrup Take 5 mLs by mouth 4 (four) times daily as needed for cough. 05/19/23  Yes Delvis Kau, Lurena Joiner, PA-C    Family History Family History  Problem Relation Age of Onset   Asthma Mother    Arthritis Mother    Diabetes Mother    Heart disease Mother  Hypertension Mother    Stroke Mother    Kidney disease Mother    Vision loss Mother    Diabetes Maternal Grandmother    Hypertension Maternal Grandmother     Social History Social History   Tobacco Use   Smoking status: Never   Smokeless tobacco: Never  Vaping Use   Vaping status: Never Used  Substance Use Topics   Alcohol use: No   Drug use: No     Allergies   Latex   Review of Systems Review of Systems As per HPI  Physical Exam Triage Vital Signs ED Triage Vitals  Encounter Vitals Group     BP 05/19/23 1624 129/80     Systolic BP Percentile --      Diastolic BP Percentile --      Pulse Rate 05/19/23 1624 90     Resp 05/19/23 1624 16     Temp 05/19/23 1624 (!) 102.1 F (38.9 C)     Temp Source 05/19/23 1624 Oral     SpO2 05/19/23 1624 99  %     Weight 05/19/23 1624 210 lb (95.3 kg)     Height 05/19/23 1624 5' (1.524 m)     Head Circumference --      Peak Flow --      Pain Score 05/19/23 1630 9     Pain Loc --      Pain Education --      Exclude from Growth Chart --    No data found.  Updated Vital Signs BP 129/80 (BP Location: Right Arm)   Pulse 90   Temp (!) 102.1 F (38.9 C) (Oral)   Resp 16   Ht 5' (1.524 m)   Wt 210 lb (95.3 kg)   LMP 05/03/2023 (Exact Date)   SpO2 99%   Breastfeeding No   BMI 41.01 kg/m    Physical Exam Vitals and nursing note reviewed.  Constitutional:      Appearance: She is not ill-appearing.  HENT:     Right Ear: Tympanic membrane and ear canal normal.     Left Ear: Tympanic membrane and ear canal normal.     Nose: No congestion or rhinorrhea.     Mouth/Throat:     Mouth: Mucous membranes are moist.     Pharynx: Oropharynx is clear. No posterior oropharyngeal erythema.  Eyes:     Conjunctiva/sclera: Conjunctivae normal.  Cardiovascular:     Rate and Rhythm: Normal rate and regular rhythm.     Pulses: Normal pulses.     Heart sounds: Normal heart sounds.  Pulmonary:     Effort: Pulmonary effort is normal.     Breath sounds: Normal breath sounds.  Musculoskeletal:     Cervical back: Normal range of motion.  Lymphadenopathy:     Cervical: No cervical adenopathy.  Skin:    General: Skin is warm and dry.  Neurological:     Mental Status: She is alert and oriented to person, place, and time.     Labs (all labs ordered are listed, but only abnormal results are displayed) Labs Reviewed - No data to display  EKG  Radiology No results found.  Procedures Procedures   Medications Ordered in UC Medications  ibuprofen (ADVIL) tablet 800 mg (800 mg Oral Given 05/19/23 1652)  albuterol (VENTOLIN HFA) 108 (90 Base) MCG/ACT inhaler 2 puff (2 puffs Inhalation Given 05/19/23 1719)  AeroChamber Plus Flo-Vu Large MISC 1 each (1 each Other Given 05/19/23 1720)    Initial  Impression /  Assessment and Plan / UC Course  I have reviewed the triage vital signs and the nursing notes.  Pertinent labs & imaging results that were available during my care of the patient were reviewed by me and considered in my medical decision making (see chart for details).  Fever 102 on arrival. Ibuprofen dose given Lungs are clear. 2 puffs inhaler given with spacer, patient with improvement in chest discomfort.  Covid+, discussed virus and symptomatic care at home. Recommend using inhaler q6 hours prn for the next several days. Promethazine DM QID prn for cough. Can try muscle relaxer for body aches. Advised increase fluids, rest. Allow several days to a week for symptoms to improve and resolve. Patient agreeable to plan, work note provided   Final Clinical Impressions(s) / UC Diagnoses   Final diagnoses:  Positive self-administered antigen test for COVID-19  Viral URI with cough     Discharge Instructions      Please continue alternating tylenol and ibuprofen every 4-6 hours for pain and fever  You can take the muscle relaxer Flexeril twice daily. If the medication makes you drowsy, take only at bed time. This may help with body aches caused by fever  Use the inhaler every 6 hours for the next several days  The cough syrup can be used up to 4 times daily. This may also make you drowsy!   It can take a week for symptoms to improve. Please continue symptomatic care and get lots of rest. Drink as much fluids as you can.      ED Prescriptions     Medication Sig Dispense Auth. Provider   cyclobenzaprine (FLEXERIL) 10 MG tablet Take 1 tablet (10 mg total) by mouth 2 (two) times daily as needed for muscle spasms. 20 tablet Lety Cullens, PA-C   promethazine-dextromethorphan (PROMETHAZINE-DM) 6.25-15 MG/5ML syrup Take 5 mLs by mouth 4 (four) times daily as needed for cough. 240 mL Sanjay Broadfoot, Lurena Joiner, PA-C      PDMP not reviewed this encounter.   Kathrine Haddock 05/19/23 [id number]

## 2023-05-19 NOTE — ED Triage Notes (Addendum)
Patient here today with c/o cough, headache, SOB, fever, chills, ears ache X 4 days. Positive Covid test done at son's doctor and at home. Son has Covid too. Took Excedrin an hour ago but not helping.

## 2023-06-14 ENCOUNTER — Ambulatory Visit (HOSPITAL_COMMUNITY): Payer: Medicaid Other

## 2023-07-04 ENCOUNTER — Ambulatory Visit (HOSPITAL_COMMUNITY)
Admission: EM | Admit: 2023-07-04 | Discharge: 2023-07-04 | Disposition: A | Discharge status: Home / Self Care | Payer: Medicaid Other | Attending: Internal Medicine | Admitting: Internal Medicine

## 2023-07-04 ENCOUNTER — Encounter (HOSPITAL_COMMUNITY): Payer: Self-pay

## 2023-07-04 ENCOUNTER — Ambulatory Visit (INDEPENDENT_AMBULATORY_CARE_PROVIDER_SITE_OTHER): Payer: Medicaid Other

## 2023-07-04 DIAGNOSIS — J988 Other specified respiratory disorders: Secondary | ICD-10-CM

## 2023-07-04 DIAGNOSIS — R051 Acute cough: Secondary | ICD-10-CM

## 2023-07-04 DIAGNOSIS — B9789 Other viral agents as the cause of diseases classified elsewhere: Secondary | ICD-10-CM | POA: Diagnosis not present

## 2023-07-04 LAB — POCT INFLUENZA A/B
Influenza A, POC: NEGATIVE
Influenza B, POC: NEGATIVE

## 2023-07-04 LAB — POCT URINE PREGNANCY: Preg Test, Ur: NEGATIVE

## 2023-07-04 MED ORDER — PREDNISONE 20 MG PO TABS: 40.0000 mg | ORAL_TABLET | Freq: Every day | ORAL | 0 refills | Status: AC

## 2023-07-04 MED ORDER — PROMETHAZINE-DM 6.25-15 MG/5ML PO SYRP: 5.0000 mL | ORAL_SOLUTION | Freq: Every evening | ORAL | 0 refills | Status: DC | PRN

## 2023-07-04 MED ORDER — BENZONATATE 100 MG PO CAPS: 100.0000 mg | ORAL_CAPSULE | Freq: Three times a day (TID) | ORAL | 0 refills | Status: DC

## 2023-07-04 NOTE — ED Triage Notes (Signed)
Pt c/o cough, nasal/chest/head congestion, body aches, and sore throat x2 days. States had a positive COVID test today. States had covid 2wks ago. Taking tylenol and ibuprofren with no relief.   Requesting pregnancy test last [facility] 05/26/23

## 2023-07-04 NOTE — Discharge Instructions (Signed)
Start taking prednisone over the next few days. You can take Tessalon during the day for cough and promethazine DM at night for cough. Once your X-ray returns I will call you with results.

## 2023-07-04 NOTE — ED Provider Notes (Signed)
[facility]    CSN: [id number] Arrival date & time: 07/04/23  1026      History   Chief Complaint Chief Complaint  Patient presents with   Cough    HPI Laura Hernandez is a 32 y.o. female.   Patient presents with cough, body aches, sore throat, fever, nasal congestion, and chest congestion x 2 days.  Patient states she had COVID 2 weeks ago and symptoms had resolved, but states that the symptoms are much worse then when she had COVID.  Denies history of asthma and denies smoking.   Cough Associated symptoms: chills, ear pain, fever, headaches, rhinorrhea, shortness of breath and sore throat   Associated symptoms: no chest pain and no wheezing     Past Medical History:  Diagnosis Date   Bronchitis    Chlamydia    Gestational diabetes    History of gonorrhea 09/18/2013   2012     History of trichomonal vaginitis 09/18/2013   2012     Migraines 2012   Trichomonas     Patient Active Problem List   Diagnosis Date Noted   Cesarean delivery delivered 09/21/2020   GDM, class A2 09/20/2020   Positive GBS test 09/20/2020   Sciatic leg pain 09/09/2020   Vaginal bleeding in pregnancy 09/09/2020   BMI 40.0-44.9, adult (HCC) 08/25/2020   Obesity in pregnancy 08/25/2020   Pruritus 08/25/2020   Gestational diabetes mellitus (GDM) in third trimester 08/04/2020   Iron deficiency anemia 08/04/2020   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 08/04/2020   Situational mixed anxiety and depressive disorder 07/01/2020   Back pain affecting pregnancy, antepartum 07/01/2020   Trichomonas infection 06/05/2020   Urinary tract colonization by group B Streptococcus affecting pregnancy 03/31/2020   Supervision of high risk pregnancy, antepartum 12/06/2013   HSV-2 (herpes simplex virus 2) infection 09/18/2013    Past Surgical History:  Procedure Laterality Date   CESAREAN SECTION N/A 09/21/2020   Procedure: CESAREAN SECTION;  Surgeon: Hazen Bing, MD;   Location: [facility];  Service: Obstetrics;  Laterality: N/A;    OB History     Gravida  2   Para  1   Term  1   Preterm      AB  1   Living  1      SAB  1   IAB      Ectopic      Multiple  0   Live Births  1            Home Medications    Prior to Admission medications   Medication Sig Start Date End Date Taking? Authorizing Provider  benzonatate (TESSALON) 100 MG capsule Take 1 capsule (100 mg total) by mouth every 8 (eight) hours. 07/04/23  Yes Susann Givens, Jensyn Cambria A, NP  predniSONE (DELTASONE) 20 MG tablet Take 2 tablets (40 mg total) by mouth daily for 5 days. 07/04/23 07/09/23 Yes Tanny Harnack A, NP  promethazine-dextromethorphan (PROMETHAZINE-DM) 6.25-15 MG/5ML syrup Take 5 mLs by mouth at bedtime as needed for cough. 07/04/23  Yes Wynonia Lawman A, NP  Biotin w/ Vitamins C & E (HAIR/SKIN/NAILS PO) Take 1 capsule by mouth daily.    [provider]  cetirizine (ZYRTEC) 10 MG tablet Take 10 mg by mouth daily. 07/06/22   [provider]  cyclobenzaprine (FLEXERIL) 10 MG tablet Take 1 tablet (10 mg total) by mouth 2 (two) times daily as needed for muscle spasms. 05/19/23   Rising, Lurena Joiner, PA-C  metformin (  FORTAMET) 500 MG (OSM) 24 hr tablet Take 500 mg by mouth at bedtime.    [provider]  Prenatal Vit-Fe Fumarate-FA (PRENATAL VITAMIN PO) Take by mouth.    [provider]    Family History Family History  Problem Relation Age of Onset   Asthma Mother    Arthritis Mother    Diabetes Mother    Heart disease Mother    Hypertension Mother    Stroke Mother    Kidney disease Mother    Vision loss Mother    Diabetes Maternal Grandmother    Hypertension Maternal Grandmother     Social History Social History   Tobacco Use   Smoking status: Never   Smokeless tobacco: Never  Vaping Use   Vaping status: Never Used  Substance Use Topics   Alcohol use: No   Drug use: No     Allergies   Latex   Review of  Systems Review of Systems  Constitutional:  Positive for activity change, appetite change, chills, fatigue and fever.  HENT:  Positive for congestion, ear pain, rhinorrhea, sinus pain and sore throat. Negative for trouble swallowing.   Respiratory:  Positive for cough, chest tightness and shortness of breath. Negative for wheezing.   Cardiovascular:  Negative for chest pain.  Gastrointestinal:  Positive for abdominal pain and nausea. Negative for blood in stool, diarrhea and vomiting.  Genitourinary:  Negative for difficulty urinating, dysuria, flank pain and hematuria.  Musculoskeletal:  Positive for back pain.  Skin:  Negative for color change.  Neurological:  Positive for dizziness, light-headedness and headaches. Negative for syncope and weakness.     Physical Exam Triage Vital Signs ED Triage Vitals  Encounter Vitals Group     BP 07/04/23 1150 118/81     Systolic BP Percentile --      Diastolic BP Percentile --      Pulse Rate 07/04/23 1150 72     Resp 07/04/23 1150 18     Temp 07/04/23 1150 98.6 F (37 C)     Temp Source 07/04/23 1150 Oral     SpO2 07/04/23 1150 97 %     Weight --      Height --      Head Circumference --      Peak Flow --      Pain Score 07/04/23 1151 10     Pain Loc --      Pain Education --      Exclude from Growth Chart --    No data found.  Updated Vital Signs BP 118/81 (BP Location: Left Arm)   Pulse 72   Temp 98.6 F (37 C) (Oral)   Resp 18   LMP 05/26/2023   SpO2 97%   Visual Acuity Right Eye Distance:   Left Eye Distance:   Bilateral Distance:    Right Eye Near:   Left Eye Near:    Bilateral Near:     Physical Exam Vitals and nursing note reviewed.  Constitutional:      General: She is awake. She is not in acute distress.    Appearance: Normal appearance. She is well-developed and well-groomed. She is ill-appearing. She is not toxic-appearing or diaphoretic.  HENT:     Right Ear: Tympanic membrane, ear canal and external  ear normal.     Left Ear: Tympanic membrane, ear canal and external ear normal.     Nose: Congestion and rhinorrhea present. No nasal tenderness.     Right Sinus: No maxillary  sinus tenderness or frontal sinus tenderness.     Left Sinus: No maxillary sinus tenderness or frontal sinus tenderness.     Mouth/Throat:     Mouth: Mucous membranes are moist.     Pharynx: Posterior oropharyngeal erythema and postnasal drip present. No pharyngeal swelling or oropharyngeal exudate.     Tonsils: No tonsillar exudate.  Cardiovascular:     Rate and Rhythm: Normal rate.     Heart sounds: Normal heart sounds.  Pulmonary:     Effort: Pulmonary effort is normal. No respiratory distress.     Breath sounds: Examination of the right-upper field reveals decreased breath sounds. Examination of the left-upper field reveals decreased breath sounds. Examination of the right-middle field reveals decreased breath sounds. Examination of the left-middle field reveals decreased breath sounds. Examination of the right-lower field reveals decreased breath sounds. Examination of the left-lower field reveals decreased breath sounds. Decreased breath sounds present. No wheezing, rhonchi or rales.     Comments: Endorses sharp pain to upper L back with deep breathing.  Abdominal:     General: Abdomen is flat. There is no distension.     Palpations: Abdomen is soft. There is no mass.     Tenderness: There is generalized abdominal tenderness. There is no right CVA tenderness, left CVA tenderness, guarding or rebound.     Hernia: No hernia is present.     Comments: Endorses soreness to generalized abdomen.   Musculoskeletal:     Cervical back: Normal range of motion.  Skin:    General: Skin is warm and dry.  Neurological:     Mental Status: She is alert and oriented to person, place, and time.     GCS: GCS eye subscore is 4. GCS verbal subscore is 5. GCS motor subscore is 6.  Psychiatric:        Behavior: Behavior is  cooperative.      UC Treatments / Results  Labs (all labs ordered are listed, but only abnormal results are displayed) Labs Reviewed  POCT URINE PREGNANCY  POCT INFLUENZA A/B    EKG   Radiology DG Chest 2 View  Result Date: 07/04/2023 CLINICAL DATA:  sharp pain with deep breathing EXAM: CHEST - 2 VIEW COMPARISON:  CXR 11/28/22 FINDINGS: No pleural effusion. No pneumothorax. No focal airspace opacity. Normal cardiac and mediastinal contours. No radiographically apparent displaced rib fractures. Visualized upper abdomen unremarkable. Vertebral body heights are maintained. IMPRESSION: No focal airspace opacity Electronically Signed   By: Lorenza Cambridge M.D.   On: 07/04/2023 14:32    Procedures Procedures (including critical care time)  Medications Ordered in UC Medications - No data to display  Initial Impression / Assessment and Plan / UC Course  I have reviewed the triage vital signs and the nursing notes.  Pertinent labs & imaging results that were available during my care of the patient were reviewed by me and considered in my medical decision making (see chart for details).     Patient presented with cough, body aches, sore throat, fever, nasal congestion, and chest congestion x 2 days. Patient reports her cough keeps her up at night. Patient states she had COVID 2 weeks ago and symptoms had resolved but states that the symptoms are much worse. She endorses chest tightness with mild shortness of breath. Denies history of asthma and denies smoking. Upon assessment patient has diminished breath sounds throughout lung fields.  Patient endorses sharp left upper back pain with deep breathing. Ordered chest x-ray to rule out pneumonia  or other complication from COVID. Flu negative in clinic. Upon waiting for radiologist to read chest x-ray patient stated that she needed to leave. Compared x-ray images from 7 months ago which appear similar to today's x-ray.  Informed patient that I would  call her once I receive official results from radiology and let her know that her treatment plan needs to be adjusted.  Prescribed cough medication as needed. Prescribed a short course of steroids. Discussed return and emergency department precautions.  Addendum @ 1449: Radiology report did not reveal pneumonia or other concerning etiology. Called patient to inform her and let her know to continue to current treatment plan period.  Final Clinical Impressions(s) / UC Diagnoses   Final diagnoses:  Viral respiratory illness  Acute cough     Discharge Instructions      Start taking prednisone over the next few days. You can take Tessalon during the day for cough and promethazine DM at night for cough. Once your X-ray returns I will call you with results.      ED Prescriptions     Medication Sig Dispense Auth. Provider   promethazine-dextromethorphan (PROMETHAZINE-DM) 6.25-15 MG/5ML syrup Take 5 mLs by mouth at bedtime as needed for cough. 118 mL Susann Givens, Sasha Rueth A, NP   benzonatate (TESSALON) 100 MG capsule Take 1 capsule (100 mg total) by mouth every 8 (eight) hours. 21 capsule Susann Givens, Jaid Quirion A, NP   predniSONE (DELTASONE) 20 MG tablet Take 2 tablets (40 mg total) by mouth daily for 5 days. 10 tablet Wynonia Lawman A, NP      PDMP not reviewed this encounter.   Wynonia Lawman A, NP 07/04/23 [phone]

## 2023-10-21 ENCOUNTER — Inpatient Hospital Stay (HOSPITAL_COMMUNITY)
Admission: AD | Admit: 2023-10-21 | Discharge: 2023-10-21 | Disposition: A | Discharge status: Home / Self Care | Payer: Medicaid Other | Attending: Obstetrics & Gynecology | Admitting: Obstetrics & Gynecology

## 2023-10-21 DIAGNOSIS — Z3202 Encounter for pregnancy test, result negative: Secondary | ICD-10-CM | POA: Diagnosis not present

## 2023-10-21 DIAGNOSIS — N926 Irregular menstruation, unspecified: Secondary | ICD-10-CM | POA: Insufficient documentation

## 2023-10-21 DIAGNOSIS — R5383 Other fatigue: Secondary | ICD-10-CM | POA: Insufficient documentation

## 2023-10-21 DIAGNOSIS — R079 Chest pain, unspecified: Secondary | ICD-10-CM | POA: Diagnosis present

## 2023-10-21 DIAGNOSIS — R519 Headache, unspecified: Secondary | ICD-10-CM | POA: Insufficient documentation

## 2023-10-21 DIAGNOSIS — R110 Nausea: Secondary | ICD-10-CM | POA: Insufficient documentation

## 2023-10-21 DIAGNOSIS — Z789 Other specified health status: Secondary | ICD-10-CM

## 2023-10-21 LAB — POCT PREGNANCY, URINE: Preg Test, Ur: NEGATIVE

## 2023-10-21 LAB — GLUCOSE, CAPILLARY: Glucose-Capillary: 140 mg/dL — ABNORMAL HIGH (ref 70–99)

## 2023-10-21 LAB — HCG, QUANTITATIVE, PREGNANCY: hCG, Beta Chain, Quant, S: 1 m[IU]/mL (ref <5)

## 2023-10-21 MED ORDER — ACETAMINOPHEN-CAFFEINE 500-65 MG PO TABS
1.0000 | ORAL_TABLET | Freq: Once | ORAL | Status: DC
Start: 2023-10-21 — End: 2023-10-21

## 2023-10-21 MED ORDER — ACETAMINOPHEN-CAFFEINE 500-65 MG PO TABS
2.0000 | ORAL_TABLET | Freq: Once | ORAL | Status: AC
  Administered 2023-10-21: 2 via ORAL
  Filled 2023-10-21: qty 2

## 2023-10-21 MED ORDER — ONDANSETRON 4 MG PO TBDP
4.0000 mg | ORAL_TABLET | Freq: Once | ORAL | Status: AC
  Administered 2023-10-21: 4 mg via ORAL
  Filled 2023-10-21: qty 1

## 2023-10-21 NOTE — Discharge Instructions (Addendum)
Follow gyn/ PCP

## 2023-10-21 NOTE — MAU Provider Note (Signed)
S [name] is a 32 y.o. [id number] pregnant/non-pregnant female at Unknown who presents to MAU today with complaint of possible pregnancy. Patient reports that she had home positive pregnancy test on 10/16/23 and then began bleeding for the past three days. She reports that she has normal and regular LMP's. She also c/o generalized malaise and c/o HA, Nausea- no vomiting and states she has not eaten anything today. She is also positive positive for mild chest pain when she is bearing down but denies SOB and denies any chest pain at this time. Her chief complaints are her HA and Nausea and would like some relief. Her urine pregnancy test here in triage was negative. She is a T2DM on Metformin   Receives care not established.   Pertinent items noted in HPI and remainder of comprehensive ROS otherwise negative.   O BP 110/64 (BP Location: Right Arm)   Pulse 75   Temp 98.4 F (36.9 C) (Oral)   Resp 16   Ht 5' (1.524 m)   SpO2 99%   BMI 41.01 kg/m  Physical Exam Vitals and nursing note reviewed.    Results for orders placed or performed during the hospital encounter of 10/21/23 (from the past 24 hours)  Pregnancy, urine POC     Status: None   Collection Time: 10/21/23 11:06 AM  Result Value Ref Range   Preg Test, Ur NEGATIVE NEGATIVE  hCG, quantitative, pregnancy     Status: None   Collection Time: 10/21/23 11:47 AM  Result Value Ref Range   hCG, Beta Chain, Quant, S 1 <5 mIU/mL  Glucose, capillary     Status: Abnormal   Collection Time: 10/21/23 11:51 AM  Result Value Ref Range   Glucose-Capillary 140 (H) 70 - 99 mg/dL    MDM:  MODERATE   MAU Course:  I was personally at the bedside while the nurse interviewed the patient ROS as stated above and based on chief complaints during nurse triage the following was performed . EKG performed and resulted - reviewed by me- normal sinus rhythm CBG performed , resulted as above and patient was given PO water tolerated Labs  pending for bHCG quant <1 ( Currently not pregnant) Excedrin and Zofran ordered for HA and nausea  with some relief noted after reassessed Patient was seen and counseled with results and offered ED for further evaluation. She declined and felt stable to return home with precautions. Per patient request a referral for endocrinology given d/t T2DM.  A Currently not pregnant Irregular menses HA  Medical screening exam complete  P Discharge from MAU in stable condition with return precautions Follow up at PCP/GYN as scheduled for ongoing care Endocrinology referral placed   No future appointments. Allergies as of 10/21/2023       Reactions   Latex Itching, Rash        Medication List     TAKE these medications    benzonatate 100 MG capsule Commonly known as: TESSALON Take 1 capsule (100 mg total) by mouth every 8 (eight) hours.   cetirizine 10 MG tablet Commonly known as: ZYRTEC Take 10 mg by mouth daily.   cyclobenzaprine 10 MG tablet Commonly known as: FLEXERIL Take 1 tablet (10 mg total) by mouth 2 (two) times daily as needed for muscle spasms.   HAIR/SKIN/NAILS PO Take 1 capsule by mouth daily.   metformin 500 MG (OSM) 24 hr tablet Commonly known as: FORTAMET Take 500 mg by mouth at bedtime.   PRENATAL  VITAMIN PO Take by mouth.   promethazine-dextromethorphan 6.25-15 MG/5ML syrup Commonly known as: PROMETHAZINE-DM Take 5 mLs by mouth at bedtime as needed for cough.        Colman Cater, NP 10/21/2023 1:52 PM

## 2023-10-21 NOTE — MAU Note (Signed)
...  Laura Hernandez is a 32 y.o. at Unknown here in MAU reporting: HA, chest pain, nausea, fatigue, exhaustion, restless, upper abdominal pain. She reports an ongoing HA for the past three days that has been relieved by Tylenol but her HA's always return. She reports her chest pain has been occasional for the past three days and reports it happens mostly when she lies down at night as well as when she tries to use the restroom. Last dose this morning at 0800. She denies current vaginal bleeding. Has not eaten yet today.  She reports two positive at home pregnancy tests and then had vaginal bleeding and took one more test and it was still positive.  LMP: 08/25/2023 - regular periods Onset of complaint: This past Monday, 12/23 Pain score:  9/10 HA, 8/10 upper abdomen  Vitals:   10/21/23 1138  BP: 110/64  Pulse: 75  Resp: 16  Temp: 98.4 F (36.9 C)  SpO2: 99%      FHT: n/a Lab orders placed from triage: POCT Preg, EKG, POCT CBG

## 2023-10-24 ENCOUNTER — Encounter (HOSPITAL_COMMUNITY): Payer: Self-pay | Admitting: Obstetrics and Gynecology

## 2023-10-24 ENCOUNTER — Inpatient Hospital Stay (HOSPITAL_COMMUNITY)
Admission: AD | Admit: 2023-10-24 | Discharge: 2023-10-24 | Disposition: A | Discharge status: Home / Self Care | Payer: Medicaid Other | Attending: Obstetrics and Gynecology | Admitting: Obstetrics and Gynecology

## 2023-10-24 DIAGNOSIS — Z98891 History of uterine scar from previous surgery: Secondary | ICD-10-CM | POA: Diagnosis not present

## 2023-10-24 DIAGNOSIS — R102 Pelvic and perineal pain: Secondary | ICD-10-CM | POA: Diagnosis not present

## 2023-10-24 DIAGNOSIS — R1084 Generalized abdominal pain: Secondary | ICD-10-CM | POA: Insufficient documentation

## 2023-10-24 DIAGNOSIS — M5459 Other low back pain: Secondary | ICD-10-CM | POA: Insufficient documentation

## 2023-10-24 DIAGNOSIS — O209 Hemorrhage in early pregnancy, unspecified: Secondary | ICD-10-CM | POA: Diagnosis present

## 2023-10-24 DIAGNOSIS — Z789 Other specified health status: Secondary | ICD-10-CM

## 2023-10-24 NOTE — MAU Note (Signed)
.  Laura Hernandez is a 32 y.o. at Unknown here in MAU reporting: Vaginal bleeding, C/S scar pain, lower mid back pain, and generalized abdominal pain. She reports she began experiencing vaginal bleeding yesterday. She reports yesterday her bleeding was heavier and reports today she has speckles of blood in her underwear. She reports she has continued to have the other symptoms reported since being discharged from MAU this past Saturday. She reports she knows she is not pregnant but reports she is concerned because of her pains. She is also concerned about her abdominal bloating. Took Advil  and Excedrin earlier today.  Negative UPT and hcg of 1 on 12/28.  RN assessed C/S scar in triage. No blood or openings in scar noted. No redness. Not warm to touch. Patient reports left side of incision is tender to touch. C/S was performed 09/21/2020.  LMP: 08/25/2023  Pain scores:  10/10 lower back 10/10 abdomen  Vitals:   10/24/23 1343  BP: 111/70  Pulse: 73  Resp: 14  Temp: 98.7 F (37.1 C)  SpO2: 100%     FHT: n/a Lab orders placed from triage: none

## 2023-10-24 NOTE — MAU Provider Note (Signed)
 Event Date/Time   First Provider Initiated Contact with Patient 10/24/23 1443      S [name] is a 32 y.o. G2P1011 patient who presents to MAU today with complaint of left-sided tenderness of her C-section scar, intermittent vaginal bleeding since yesterday, cramping, low mid back pain rated 10/10, generalized abdominal pain rated 10/10.  She states vaginal bleeding was heavier yesterday.  She reports the bleeding is speckles today.  She was in MAU on Saturday for the same symptoms for which she was discharged home.  She states she knows she is not pregnant, but is concerned because of the pain.  She took Advil  and Excedrin earlier today without any relief.  The patient C-section was 09/21/2020.  UPT negative hCG 1 on 10/21/2023.  The patient receives GYN care at [facility] at [facility].  She states when she called them today to tell them her symptoms they advised her to come to MAU for evaluation.  O BP 111/70 (BP Location: Right Arm)   Pulse 73   Temp 98.7 F (37.1 C) (Oral)   Resp 14   Ht 5' (1.524 m)   Wt 97.4 kg   SpO2 100%   BMI 41.93 kg/m   Physical Exam Vitals and nursing note reviewed.  Constitutional:      Appearance: Normal appearance. She is obese.  Cardiovascular:     Rate and Rhythm: Normal rate.  Pulmonary:     Effort: Pulmonary effort is normal.  Abdominal:     Comments: C/S scar C/D/I no bleeding observed per RN assessment/note  Genitourinary:    Comments: deferred Musculoskeletal:        General: Normal range of motion.  Skin:    General: Skin is warm and dry.  Neurological:     Mental Status: She is alert and oriented to person, place, and time.  Psychiatric:        Mood and Affect: Mood normal.        Behavior: Behavior normal.        Thought Content: Thought content normal.        Judgment: Judgment normal.    A Medical screening exam complete 1. Not currently pregnant (Primary)  2. Pelvic pain    P Advised  pregnancy was ruled out 3 days ago and MAU evaluation is for pregnant women or non-pregnant women with vaginal bleeding. She is not having either complaint at this time.  Recommend evaluation at another facility for all of her complaints today. Discharge from MAU in stable condition Patient given the option to seek care in outpatient facility of choice (Urgent Care or GYN provider) Warning signs for worsening condition that would warrant emergency follow-up discussed Patient may return to MAU as needed for pregnancy related issues  Letha Renshaw, CNM 10/24/2023 2:44 PM

## 2023-11-11 ENCOUNTER — Encounter (HOSPITAL_COMMUNITY): Payer: Self-pay

## 2023-11-11 ENCOUNTER — Ambulatory Visit (HOSPITAL_COMMUNITY)
Admission: RE | Admit: 2023-11-11 | Discharge: 2023-11-11 | Discharge status: Home / Self Care | Payer: Medicaid Other | Source: Ambulatory Visit | Attending: Emergency Medicine

## 2023-11-11 VITALS — BP 106/73 | HR 82 | Temp 99.0°F | Resp 16 | Ht 60.0 in | Wt 210.0 lb

## 2023-11-11 DIAGNOSIS — M5442 Lumbago with sciatica, left side: Secondary | ICD-10-CM

## 2023-11-11 DIAGNOSIS — M5441 Lumbago with sciatica, right side: Secondary | ICD-10-CM

## 2023-11-11 MED ORDER — DEXAMETHASONE SODIUM PHOSPHATE 10 MG/ML IJ SOLN
INTRAMUSCULAR | Status: AC
  Filled 2023-11-11: qty 1

## 2023-11-11 MED ORDER — DEXAMETHASONE SODIUM PHOSPHATE 10 MG/ML IJ SOLN
10.0000 mg | Freq: Once | INTRAMUSCULAR | Status: AC
  Administered 2023-11-11: 10 mg via INTRAMUSCULAR

## 2023-11-11 MED ORDER — NAPROXEN 500 MG PO TABS: 500.0000 mg | ORAL_TABLET | Freq: Two times a day (BID) | ORAL | 0 refills | Status: DC

## 2023-11-11 NOTE — Discharge Instructions (Addendum)
The steroid shot be given in clinic will help with inflammation.  Please continue to heat and use gentle stretching, I have attached some exercises that may help.  Take the naproxen twice daily with food.  If you have any breakthrough pain you can take 500 mg of Tylenol.  You can continue with your muscle relaxer as needed.  If your symptoms persist despite these interventions please follow-up with orthopedic for further evaluation.

## 2023-11-11 NOTE — ED Triage Notes (Signed)
Patient here today with c/o LB pain that radiates down buttock and legs X 3-4 days. She has tried taking Tylenol, IBU, Excedrin, heat, and stretching with no relief. She has also tried taking Cyclobenzaprine with no relief.

## 2023-11-11 NOTE — ED Provider Notes (Signed)
[facility]    CSN: [id number] Arrival date & time: 11/11/23  1452      History   Chief Complaint Chief Complaint  Patient presents with   Back Pain    HPI Laura Hernandez is a 33 y.o. female.   Patient presents to clinic for bilateral lower back pain that radiates down her buttocks and into the back of her thighs bilaterally for the past 4 days.  She has not had any recent trauma, injuries or falls.  Nothing that she can think of caused the back pain.  She is having to adjust the way she sleeps at night due to the pain.  Pain if she picks up her leg, pain with bending and twisting.  She has tried Tylenol, ibuprofen, Excedrin, heat, icing, stretching and cyclobenzaprine.  Denies any inner leg numbness.  No incontinence.  No urinary symptoms.  The history is provided by the patient and medical records.  Back Pain   Past Medical History:  Diagnosis Date   Bronchitis    Chlamydia    Gestational diabetes    History of gonorrhea 09/18/2013   2012     History of trichomonal vaginitis 09/18/2013   2012     Migraines 2012   Trichomonas     Patient Active Problem List   Diagnosis Date Noted   Cesarean delivery delivered 09/21/2020   GDM, class A2 09/20/2020   Positive GBS test 09/20/2020   Sciatic leg pain 09/09/2020   Vaginal bleeding in pregnancy 09/09/2020   BMI 40.0-44.9, adult (HCC) 08/25/2020   Obesity in pregnancy 08/25/2020   Pruritus 08/25/2020   Gestational diabetes mellitus (GDM) in third trimester 08/04/2020   Iron deficiency anemia 08/04/2020   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 08/04/2020   Situational mixed anxiety and depressive disorder 07/01/2020   Back pain affecting pregnancy, antepartum 07/01/2020   Trichomonas infection 06/05/2020   Urinary tract colonization by group B Streptococcus affecting pregnancy 03/31/2020   Supervision of high risk pregnancy, antepartum 12/06/2013   HSV-2 (herpes simplex virus 2)  infection 09/18/2013    Past Surgical History:  Procedure Laterality Date   CESAREAN SECTION N/A 09/21/2020   Procedure: CESAREAN SECTION;  Surgeon: [place] Bing, MD;  [facility]: [facility];  Service: Obstetrics;  Laterality: N/A;    OB History     Gravida  2   Para  1   Term  1   Preterm      AB  1   Living  1      SAB  1   IAB      Ectopic      Multiple  0   Live Births  1            Home Medications    Prior to Admission medications   Medication Sig Start Date End Date Taking? Authorizing Provider  naproxen (NAPROSYN) 500 MG tablet Take 1 tablet (500 mg total) by mouth 2 (two) times daily. 11/11/23  Yes Rinaldo Ratel, [place] N, FNP  Biotin w/ Vitamins C & E (HAIR/SKIN/NAILS PO) Take 1 capsule by mouth daily.    [provider]  cetirizine (ZYRTEC) 10 MG tablet Take 10 mg by mouth daily. 07/06/22   [provider]  cyclobenzaprine (FLEXERIL) 10 MG tablet Take 1 tablet (10 mg total) by mouth 2 (two) times daily as needed for muscle spasms. 05/19/23   Rising, Lurena Joiner, PA-C  metformin (FORTAMET) 500 MG (OSM) 24 hr tablet Take 500 mg by  mouth at bedtime.    [provider]    Family History Family History  Problem Relation Age of Onset   Asthma Mother    Arthritis Mother    Diabetes Mother    Heart disease Mother    Hypertension Mother    Stroke Mother    Kidney disease Mother    Vision loss Mother    Diabetes Maternal Grandmother    Hypertension Maternal Grandmother     Social History Social History   Tobacco Use   Smoking status: Never   Smokeless tobacco: Never  Vaping Use   Vaping status: Never Used  Substance Use Topics   Alcohol use: No   Drug use: No     Allergies   Latex   Review of Systems Review of Systems  Per HPI  Physical Exam Triage Vital Signs ED Triage Vitals  Encounter Vitals Group     BP 11/11/23 1507 106/73     Systolic BP Percentile --      Diastolic BP Percentile --      Pulse  Rate 11/11/23 1507 82     Resp 11/11/23 1507 16     Temp 11/11/23 1507 99 F (37.2 C)     Temp Source 11/11/23 1507 Oral     SpO2 11/11/23 1507 95 %     Weight 11/11/23 1504 210 lb (95.3 kg)     Height 11/11/23 1504 5' (1.524 m)     Head Circumference --      Peak Flow --      Pain Score 11/11/23 1503 10     Pain Loc --      Pain Education --      Exclude from Growth Chart --    No data found.  Updated Vital Signs BP 106/73 (BP Location: Right Arm)   Pulse 82   Temp 99 F (37.2 C) (Oral)   Resp 16   Ht 5' (1.524 m)   Wt 210 lb (95.3 kg)   LMP 10/30/2023 (Approximate)   SpO2 95%   BMI 41.01 kg/m   Visual Acuity Right Eye Distance:   Left Eye Distance:   Bilateral Distance:    Right Eye Near:   Left Eye Near:    Bilateral Near:     Physical Exam Vitals reviewed.  Constitutional:      Appearance: Normal appearance. She is obese.  HENT:     Head: Normocephalic and atraumatic.     Right Ear: External ear normal.     Left Ear: External ear normal.     Nose: Nose normal.     Mouth/Throat:     Mouth: Mucous membranes are moist.  Eyes:     Conjunctiva/sclera: Conjunctivae normal.  Cardiovascular:     Rate and Rhythm: Normal rate.  Pulmonary:     Effort: Pulmonary effort is normal. No respiratory distress.  Musculoskeletal:        General: Tenderness present. No swelling or signs of injury.     Thoracic back: Normal.     Lumbar back: Spasms and tenderness present. Positive right straight leg raise test and positive left straight leg raise test.       Back:     Comments: Bilateral LBP that radiates into buttocks and back of legs. Muscular TTP and w/ ROM. No rashes, skin changes or swelling. Spine w/o step off or deformity, atraumatic.   Skin:    General: Skin is warm and dry.  Neurological:     General: No  focal deficit present.     Mental Status: She is alert and oriented to person, place, and time.  Psychiatric:        Mood and Affect: Mood normal.         Behavior: Behavior normal. Behavior is cooperative.      UC Treatments / Results  Labs (all labs ordered are listed, but only abnormal results are displayed) Labs Reviewed - No data to display  EKG   Radiology No results found.  Procedures Procedures (including critical care time)  Medications Ordered in UC Medications  dexamethasone (DECADRON) injection 10 mg (has no administration in time range)    Initial Impression / Assessment and Plan / UC Course  I have reviewed the triage vital signs and the nursing notes.  Pertinent labs & imaging results that were available during my care of the patient were reviewed by me and considered in my medical decision making (see chart for details).  Vitals and triage reviewed, patient is hemodynamically stable.  Bilateral lower back pain that is tender to palpation and pain is elicited with range of motion.  Bilateral radiation down into the back of legs, suspicious for sciatica.  Patient already on muscle relaxers and anti-inflammatories, will trial steroid injection.  Atraumatic back pain, no spinal step-off, deformity or tenderness, imaging deferred at this time.  No inner leg numbness, no incontinence, low concern for cauda equina.  Will trial scheduled naproxen and continue on scheduled muscle relaxers.  Plan of care, follow-up care return precautions given, no questions at this time.  Work note provided.     Final Clinical Impressions(s) / UC Diagnoses   Final diagnoses:  Acute bilateral low back pain with bilateral sciatica     Discharge Instructions      The steroid shot be given in clinic will help with inflammation.  Please continue to heat and use gentle stretching, I have attached some exercises that may help.  Take the naproxen twice daily with food.  If you have any breakthrough pain you can take 500 mg of Tylenol.  You can continue with your muscle relaxer as needed.  If your symptoms persist despite these interventions  please follow-up with orthopedic for further evaluation.    ED Prescriptions     Medication Sig Dispense Auth. Provider   naproxen (NAPROSYN) 500 MG tablet Take 1 tablet (500 mg total) by mouth 2 (two) times daily. 30 tablet Shanira Tine, [place] N, [place]      PDMP not reviewed this encounter.   Mellie Buccellato, [place] N, [place] 11/11/23 [id number]

## 2023-11-15 ENCOUNTER — Ambulatory Visit (HOSPITAL_COMMUNITY): Payer: Medicaid Other

## 2023-11-16 ENCOUNTER — Ambulatory Visit (HOSPITAL_COMMUNITY): Payer: Medicaid Other

## 2023-12-02 ENCOUNTER — Ambulatory Visit (HOSPITAL_COMMUNITY)
Admission: RE | Admit: 2023-12-02 | Discharge: 2023-12-02 | Disposition: A | Discharge status: Home / Self Care | Payer: Medicaid Other | Source: Ambulatory Visit | Attending: Physician Assistant | Admitting: Physician Assistant

## 2023-12-02 ENCOUNTER — Encounter (HOSPITAL_COMMUNITY): Payer: Self-pay

## 2023-12-02 VITALS — BP 110/65 | HR 45 | Temp 98.9°F | Resp 16

## 2023-12-02 DIAGNOSIS — R1032 Left lower quadrant pain: Secondary | ICD-10-CM | POA: Diagnosis not present

## 2023-12-02 LAB — POCT URINALYSIS DIP (MANUAL ENTRY)
Bilirubin, UA: NEGATIVE
Blood, UA: NEGATIVE
Glucose, UA: NEGATIVE mg/dL
Ketones, POC UA: NEGATIVE mg/dL
Nitrite, UA: NEGATIVE
Spec Grav, UA: >=1.03 — AB
Urobilinogen, UA: 0.2 U/dL
pH, UA: 6.5

## 2023-12-02 LAB — POCT URINE PREGNANCY: Preg Test, Ur: NEGATIVE

## 2023-12-02 NOTE — Discharge Instructions (Addendum)
 Negative pregnancy test here today.  Will send out urine culture If pain is severe recommend Emergency Department visit.  Follow up with Primary Care Physician regarding pending results and further workup.

## 2023-12-02 NOTE — ED Triage Notes (Signed)
 Patient report her ovaries hurt on the left side. Patient states she saw her PCP and did STD, blood work and UA testing.  Patient was hoping to get an ultrasound today.

## 2023-12-02 NOTE — ED Provider Notes (Signed)
 [facility]    CSN: [id number] Arrival date & time: 12/02/23  1621      History   Chief Complaint Chief Complaint  Patient presents with   Abdominal Pain   Headache    HPI Laura Hernandez is a 33 y.o. female.   Pt presents with left lower abdominal pain that started about 5 days ago.  She was seen by PCP and had UA, culture, STI testing.  Home pregnancy test negative.  LMP 10/29/23.  She is not currently on birth control.  She denies urinary sx.  Denies n/v/d, fever chills.  She was hoping we would do an ultrasound here today.     Past Medical History:  Diagnosis Date   Bronchitis    Chlamydia    Gestational diabetes    History of gonorrhea 09/18/2013   2012     History of trichomonal vaginitis 09/18/2013   2012     Migraines 2012   Trichomonas     Patient Active Problem List   Diagnosis Date Noted   Cesarean delivery delivered 09/21/2020   GDM, class A2 09/20/2020   Positive GBS test 09/20/2020   Sciatic leg pain 09/09/2020   Vaginal bleeding in pregnancy 09/09/2020   BMI 40.0-44.9, adult (HCC) 08/25/2020   Obesity in pregnancy 08/25/2020   Pruritus 08/25/2020   Gestational diabetes mellitus (GDM) in third trimester 08/04/2020   Iron  deficiency anemia 08/04/2020   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 08/04/2020   Situational mixed anxiety and depressive disorder 07/01/2020   Back pain affecting pregnancy, antepartum 07/01/2020   Trichomonas infection 06/05/2020   Urinary tract colonization by group B Streptococcus affecting pregnancy 03/31/2020   Supervision of high risk pregnancy, antepartum 12/06/2013   HSV-2 (herpes simplex virus 2) infection 09/18/2013    Past Surgical History:  Procedure Laterality Date   CESAREAN SECTION N/A 09/21/2020   Procedure: CESAREAN SECTION;  Surgeon: Izell Harari, MD;  Location: [facility];  Service: Obstetrics;  Laterality: N/A;    OB History     Gravida  2   Para  1   Term  1    Preterm      AB  1   Living  1      SAB  1   IAB      Ectopic      Multiple  0   Live Births  1            Home Medications    Prior to Admission medications   Medication Sig Start Date End Date Taking? Authorizing Provider  Biotin w/ Vitamins C & E (HAIR/SKIN/NAILS PO) Take 1 capsule by mouth daily.    [provider]  cetirizine (ZYRTEC) 10 MG tablet Take 10 mg by mouth daily. 07/06/22   [provider]  cyclobenzaprine  (FLEXERIL ) 10 MG tablet Take 1 tablet (10 mg total) by mouth 2 (two) times daily as needed for muscle spasms. 05/19/23   Rising, [place], PA-C  metformin  (FORTAMET ) 500 MG (OSM) 24 hr tablet Take 500 mg by mouth at bedtime.    [provider]  naproxen  (NAPROSYN ) 500 MG tablet Take 1 tablet (500 mg total) by mouth 2 (two) times daily. 11/11/23   Dreama Antonina SAILOR, FNP    Family History Family History  Problem Relation Age of Onset   Asthma Mother    Arthritis Mother    Diabetes Mother    Heart disease Mother    Hypertension Mother  Stroke Mother    Kidney disease Mother    Vision loss Mother    Diabetes Maternal Grandmother    Hypertension Maternal Grandmother     Social History Social History   Tobacco Use   Smoking status: Never   Smokeless tobacco: Never  Vaping Use   Vaping status: Never Used  Substance Use Topics   Alcohol use: No   Drug use: No     Allergies   Latex   Review of Systems Review of Systems  Constitutional:  Negative for chills and fever.  HENT:  Negative for ear pain and sore throat.   Eyes:  Negative for pain and visual disturbance.  Respiratory:  Negative for cough and shortness of breath.   Cardiovascular:  Negative for chest pain and palpitations.  Gastrointestinal:  Positive for abdominal pain. Negative for vomiting.  Genitourinary:  Negative for dysuria and hematuria.  Musculoskeletal:  Negative for arthralgias and back pain.  Skin:  Negative for color change and rash.   Neurological:  Negative for seizures and syncope.  All other systems reviewed and are negative.    Physical Exam Triage Vital Signs ED Triage Vitals [12/02/23 1651]  Encounter Vitals Group     BP 110/65     Systolic BP Percentile      Diastolic BP Percentile      Pulse Rate (!) 45     Resp 16     Temp 98.9 F (37.2 C)     Temp Source Oral     SpO2 96 %     Weight      Height      Head Circumference      Peak Flow      Pain Score 6     Pain Loc      Pain Education      Exclude from Growth Chart    No data found.  Updated Vital Signs BP 110/65 (BP Location: Left Arm)   Pulse (!) 45   Temp 98.9 F (37.2 C) (Oral)   Resp 16   LMP 10/30/2023 (Approximate)   SpO2 96%   Visual Acuity Right Eye Distance:   Left Eye Distance:   Bilateral Distance:    Right Eye Near:   Left Eye Near:    Bilateral Near:     Physical Exam Vitals and nursing note reviewed.  Constitutional:      General: She is not in acute distress.    Appearance: She is well-developed.  HENT:     Head: Normocephalic and atraumatic.  Eyes:     Conjunctiva/sclera: Conjunctivae normal.  Cardiovascular:     Rate and Rhythm: Normal rate and regular rhythm.     Heart sounds: No murmur heard. Pulmonary:     Effort: Pulmonary effort is normal. No respiratory distress.     Breath sounds: Normal breath sounds.  Abdominal:     Palpations: Abdomen is soft.     Tenderness: There is no abdominal tenderness.  Musculoskeletal:        General: No swelling.     Cervical back: Neck supple.  Skin:    General: Skin is warm and dry.     Capillary Refill: Capillary refill takes less than 2 seconds.  Neurological:     Mental Status: She is alert.  Psychiatric:        Mood and Affect: Mood normal.      UC Treatments / Results  Labs (all labs ordered are listed, but only abnormal results are displayed)  Labs Reviewed  POCT URINALYSIS DIP (MANUAL ENTRY) - Abnormal; Notable for the following components:       Result Value   Clarity, UA cloudy (*)    Spec Grav, UA >=1.030 (*)    Protein Ur, POC trace (*)    Leukocytes, UA Small (1+) (*)    All other components within normal limits  POCT URINE PREGNANCY - Normal    EKG   Radiology No results found.  Procedures Procedures (including critical care time)  Medications Ordered in UC Medications - No data to display  Initial Impression / Assessment and Plan / UC Course  I have reviewed the triage vital signs and the nursing notes.  Pertinent labs & imaging results that were available during my care of the patient were reviewed by me and considered in my medical decision making (see chart for details).     Left lower quadrant pain, negative pregnancy test.  STI testing completed by PCP, pending results.  Advised to go to the ED if pain is severe.  Advised follow up with PCP on Monday.   Final Clinical Impressions(s) / UC Diagnoses   Final diagnoses:  Abdominal pain, left lower quadrant     Discharge Instructions      Negative pregnancy test here today.  Will send out urine culture If pain is severe recommend Emergency Department visit.  Follow up with Primary Care Physician regarding pending results and further workup.      ED Prescriptions   None    PDMP not reviewed this encounter.   Ward, Harlene PEDLAR, PA-C 12/02/23 1751

## 2023-12-05 ENCOUNTER — Ambulatory Visit: Payer: Medicaid Other | Admitting: Obstetrics & Gynecology

## 2023-12-05 ENCOUNTER — Encounter: Payer: Self-pay | Admitting: Obstetrics & Gynecology

## 2023-12-05 NOTE — Progress Notes (Signed)
Patient did not show up today for her scheduled appointment.   Laura Collins, MD, FACOG Obstetrician & Gynecologist, [facility] for [employer], [facility]

## 2023-12-12 ENCOUNTER — Encounter: Payer: Self-pay | Admitting: Obstetrics and Gynecology

## 2023-12-12 ENCOUNTER — Encounter (HOSPITAL_COMMUNITY): Payer: Self-pay | Admitting: Obstetrics and Gynecology

## 2023-12-12 ENCOUNTER — Ambulatory Visit: Payer: Medicaid Other | Admitting: Obstetrics and Gynecology

## 2023-12-12 ENCOUNTER — Inpatient Hospital Stay (HOSPITAL_COMMUNITY)
Admission: AD | Admit: 2023-12-12 | Discharge: 2023-12-12 | Discharge status: Against Medical Advice | Payer: Medicaid Other | Attending: Obstetrics and Gynecology | Admitting: Obstetrics and Gynecology

## 2023-12-12 ENCOUNTER — Inpatient Hospital Stay (HOSPITAL_COMMUNITY): Payer: Medicaid Other

## 2023-12-12 ENCOUNTER — Telehealth: Payer: Self-pay | Admitting: Certified Nurse Midwife

## 2023-12-12 VITALS — BP 140/78 | HR 80 | Wt 218.0 lb

## 2023-12-12 DIAGNOSIS — R102 Pelvic and perineal pain: Secondary | ICD-10-CM

## 2023-12-12 DIAGNOSIS — Z5321 Procedure and treatment not carried out due to patient leaving prior to being seen by health care provider: Secondary | ICD-10-CM | POA: Diagnosis not present

## 2023-12-12 DIAGNOSIS — O26891 Other specified pregnancy related conditions, first trimester: Secondary | ICD-10-CM | POA: Diagnosis present

## 2023-12-12 DIAGNOSIS — Z6841 Body Mass Index (BMI) 40.0 and over, adult: Secondary | ICD-10-CM

## 2023-12-12 DIAGNOSIS — Z3201 Encounter for pregnancy test, result positive: Secondary | ICD-10-CM | POA: Diagnosis not present

## 2023-12-12 DIAGNOSIS — R030 Elevated blood-pressure reading, without diagnosis of hypertension: Secondary | ICD-10-CM | POA: Diagnosis not present

## 2023-12-12 DIAGNOSIS — O3680X0 Pregnancy with inconclusive fetal viability, not applicable or unspecified: Secondary | ICD-10-CM | POA: Diagnosis not present

## 2023-12-12 DIAGNOSIS — N912 Amenorrhea, unspecified: Secondary | ICD-10-CM

## 2023-12-12 DIAGNOSIS — Z3A01 Less than 8 weeks gestation of pregnancy: Secondary | ICD-10-CM

## 2023-12-12 HISTORY — DX: Urinary tract infection, site not specified: N39.0

## 2023-12-12 HISTORY — DX: Anemia, unspecified: D64.9

## 2023-12-12 LAB — CBC
HCT: 37.7 % (ref 36.0–46.0)
Hemoglobin: 11.3 g/dL — ABNORMAL LOW (ref 12.0–15.0)
MCH: 20.2 pg — ABNORMAL LOW (ref 26.0–34.0)
MCHC: 30 g/dL (ref 30.0–36.0)
MCV: 67.4 fL — ABNORMAL LOW (ref 80.0–100.0)
Platelets: 303 10*3/uL (ref 150–400)
RBC: 5.59 MIL/uL — ABNORMAL HIGH (ref 3.87–5.11)
RDW: 17.5 % — ABNORMAL HIGH (ref 11.5–15.5)
WBC: 9.3 10*3/uL (ref 4.0–10.5)
nRBC: 0 % (ref 0.0–0.2)

## 2023-12-12 LAB — POCT URINE PREGNANCY: Preg Test, Ur: POSITIVE — AB

## 2023-12-12 LAB — HCG, QUANTITATIVE, PREGNANCY: hCG, Beta Chain, Quant, S: 31 m[IU]/mL — ABNORMAL HIGH (ref <5)

## 2023-12-12 MED ORDER — PREPLUS 27-1 MG PO TABS
1.0000 | ORAL_TABLET | Freq: Every day | ORAL | Status: AC
Start: 2023-12-12 — End: ?

## 2023-12-12 NOTE — MAU Note (Signed)
Laura Hernandez is a 33 y.o. at [redacted]w[redacted]d here in MAU reporting: sent from clinic for further eval.  +preg test there this morning, pt reports neg test ~ week ago.  Started having pain in upper abd a few wks ago- cramping- feels like strong menstrual cramps, comes and goes, also left groin- only lasted a wk, none in last couple days.  Bad heartburn, can hardly swallow or drink. No bleeding. LMP: 1/5 Onset of complaint:1/28 Pain score: 6 Vitals:   12/12/23 1157  BP: 121/82  Pulse: 67  Resp: 18  Temp: 99.4 F (37.4 C)  SpO2: 100%      Lab orders placed from triage:

## 2023-12-12 NOTE — Telephone Encounter (Signed)
I called [name] today at 1:32 PM and confirmed patient's identity using two patient identifiers. [place] results from earlier today were still pending. Quant results reviewed. Quant 31 today. Reviewed that at this time [place] results may not show a pregnancy especially with a quant of only 31. Recommended 48 hour up quant and patient agreeable to plan of care. First trimester warning signs reviewed. Patient voiced understanding and had no further questions.   Results for orders placed or performed during the hospital encounter of 12/12/23 (from the past 24 hours)  CBC     Status: Abnormal   Collection Time: 12/12/23 12:01 PM  Result Value Ref Range   WBC 9.3 4.0 - 10.5 K/uL   RBC 5.59 (H) 3.87 - 5.11 MIL/uL   Hemoglobin 11.3 (L) 12.0 - 15.0 g/dL   HCT 40.9 81.1 - 91.4 %   MCV 67.4 (L) 80.0 - 100.0 fL   MCH 20.2 (L) 26.0 - 34.0 pg   MCHC 30.0 30.0 - 36.0 g/dL   RDW 78.2 (H) 95.6 - 21.3 %   Platelets 303 150 - 400 K/uL   nRBC 0.0 0.0 - 0.2 %  ABO/Rh     Status: None (Preliminary result)   Collection Time: 12/12/23 12:01 PM  Result Value Ref Range   ABO/RH(D) PENDING   hCG, quantitative, pregnancy     Status: Abnormal   Collection Time: 12/12/23 12:01 PM  Result Value Ref Range   hCG, Beta Chain, Quant, S 31 (H) <5 mIU/mL      Carlynn Herald, CNM 12/12/2023 1:32 PM

## 2023-12-12 NOTE — Progress Notes (Signed)
RGYN here for Pelvic Pain x 3 wks pain is aching and sharp. Now went to PCP for STD screening all was WNL. Has not had cycle. UPT was negative 1-2 wks ago.   Pt requesting U/S.  LMP: 10/29/2023. Contraception: None   Last pap: recent per pt 2024 WNL  Neg HPV pt will call office to get records sent here.

## 2023-12-12 NOTE — MAU Note (Signed)
Call from front desk,  pt had rec'd call that her son was sick and she needed to leave.

## 2023-12-12 NOTE — MAU Provider Note (Signed)
  History     CSN: [id number]  Arrival date and time: 12/12/23 1121   Event Date/Time   First Provider Initiated Contact with Patient 12/12/23 1318      Chief Complaint  Patient presents with   Vaginal Bleeding   Laura Hernandez , a  33 y.o. G3P1011 at [redacted]w[redacted]d presents to MAU after being sent from the office for on-going pelvic pain.   Patient left MAU prior to being seen by provider.         Terika Pillard Danella Deis) Suzie Portela, MSN, CNM  [facility]  12/12/2023 1:30 PM

## 2023-12-12 NOTE — Progress Notes (Addendum)
Obstetrics and Gynecology Visit Return Patient Evaluation  Appointment Date: 12/12/2023  Primary Care Provider: Diamantina Providence  OBGYN Clinic: [facility] for [facility]  Chief Complaint: abdominal pain/cramping x 1 week  History of Present Illness:  Laura Hernandez is a 33 y.o. [id number] with above CC. LMP 1/5 and she states she has qmonth, regular periods and isn't on anything for contraception; she is sexually active. No VB, discharge. She states she went to her PCP two weeks ago and had a negative pap and STI work up. Patient states she took a home UPT a few days ago and it was negative.  Review of Systems: as noted in the History of Present Illness.  Patient Active Problem List   Diagnosis Date Noted   Situational mixed anxiety and depressive disorder 07/01/2020   Medications:  Sanika S. Olkowski "[name]" had no medications administered during this visit. Current Outpatient Medications  Medication Sig Dispense Refill   Biotin w/ Vitamins C & E (HAIR/SKIN/NAILS PO) Take 1 capsule by mouth daily.     cetirizine (ZYRTEC) 10 MG tablet Take 10 mg by mouth daily.     cyclobenzaprine (FLEXERIL) 10 MG tablet Take 1 tablet (10 mg total) by mouth 2 (two) times daily as needed for muscle spasms. 20 tablet 0   metformin (FORTAMET) 500 MG (OSM) 24 hr tablet Take 500 mg by mouth at bedtime.     naproxen (NAPROSYN) 500 MG tablet Take 1 tablet (500 mg total) by mouth 2 (two) times daily. 30 tablet 0   No current facility-administered medications for this visit.    Allergies: is allergic to latex.  Physical Exam:  BP (!) 140/78   Pulse 80   Wt 218 lb (98.9 kg)   LMP 10/29/2023   BMI 42.58 kg/m  Body mass index is 42.58 kg/m. General appearance: Well nourished, well developed female in no acute distress.  Abdomen: soft, nttp, nd Neuro/Psych:  Normal mood and affect.    Labs: POC UPT with line x 2.   Assessment: patient stable  Plan:  1. Pelvic pain U/a a week ago not  concerning for infection; will send ucx today. D/w her re: need for ectopic w/u and to go to MAU today for this; patient amenable to this. MAU aware Declines exam today and will try and get records from PCP - Urine Culture  2. Pregnancy of unknown anatomic location  Future Appointments  Date Time Provider Department Center  12/12/2023  9:55 AM Tuttle Bing, MD [facility]    Cornelia Copa MD Attending Center for [facility])

## 2023-12-13 LAB — ABO/RH: ABO/RH(D): B POS

## 2023-12-14 ENCOUNTER — Other Ambulatory Visit: Payer: Self-pay

## 2023-12-14 DIAGNOSIS — O3680X0 Pregnancy with inconclusive fetal viability, not applicable or unspecified: Secondary | ICD-10-CM

## 2023-12-14 LAB — URINE CULTURE

## 2023-12-15 ENCOUNTER — Telehealth: Payer: Self-pay

## 2023-12-15 ENCOUNTER — Encounter: Payer: Self-pay | Admitting: Obstetrics and Gynecology

## 2023-12-15 LAB — BETA HCG QUANT (REF LAB): hCG Quant: 69 m[IU]/mL

## 2023-12-15 MED ORDER — CEFADROXIL 500 MG PO CAPS: 500.0000 mg | ORAL_CAPSULE | Freq: Two times a day (BID) | ORAL | 0 refills | Status: AC

## 2023-12-15 NOTE — Telephone Encounter (Signed)
S/w pt and advised of hcg results and provider recommendations. Pt reported left sided cramping and feeling of "heaviness" in back, advised pt that per Dr. Jolayne Panther, she should report to mau to rule out ectopic.

## 2023-12-15 NOTE — Addendum Note (Signed)
Addended by: Tupelo Bing on: 12/15/2023 05:49 PM   Modules accepted: Orders

## 2023-12-18 ENCOUNTER — Inpatient Hospital Stay (HOSPITAL_COMMUNITY)
Admission: AD | Admit: 2023-12-18 | Discharge: 2023-12-18 | Disposition: A | Discharge status: Home / Self Care | Payer: Medicaid Other | Attending: Obstetrics and Gynecology | Admitting: Obstetrics and Gynecology

## 2023-12-18 ENCOUNTER — Other Ambulatory Visit: Payer: Self-pay

## 2023-12-18 DIAGNOSIS — O26891 Other specified pregnancy related conditions, first trimester: Secondary | ICD-10-CM | POA: Diagnosis not present

## 2023-12-18 DIAGNOSIS — O3680X0 Pregnancy with inconclusive fetal viability, not applicable or unspecified: Secondary | ICD-10-CM

## 2023-12-18 DIAGNOSIS — R1030 Lower abdominal pain, unspecified: Secondary | ICD-10-CM | POA: Insufficient documentation

## 2023-12-18 DIAGNOSIS — O26899 Other specified pregnancy related conditions, unspecified trimester: Secondary | ICD-10-CM

## 2023-12-18 DIAGNOSIS — O209 Hemorrhage in early pregnancy, unspecified: Secondary | ICD-10-CM

## 2023-12-18 DIAGNOSIS — Z3A01 Less than 8 weeks gestation of pregnancy: Secondary | ICD-10-CM | POA: Diagnosis not present

## 2023-12-18 DIAGNOSIS — N930 Postcoital and contact bleeding: Secondary | ICD-10-CM

## 2023-12-18 DIAGNOSIS — O26851 Spotting complicating pregnancy, first trimester: Secondary | ICD-10-CM | POA: Diagnosis present

## 2023-12-18 LAB — URINALYSIS, ROUTINE W REFLEX MICROSCOPIC
Bilirubin Urine: NEGATIVE
Glucose, UA: NEGATIVE mg/dL
Ketones, ur: NEGATIVE mg/dL
Leukocytes,Ua: NEGATIVE
Nitrite: NEGATIVE
Protein, ur: NEGATIVE mg/dL
Specific Gravity, Urine: 1.015 (ref 1.005–1.030)
pH: 7 (ref 5.0–8.0)

## 2023-12-18 LAB — CBC
HCT: 38.4 % (ref 36.0–46.0)
Hemoglobin: 11.4 g/dL — ABNORMAL LOW (ref 12.0–15.0)
MCH: 20.4 pg — ABNORMAL LOW (ref 26.0–34.0)
MCHC: 29.7 g/dL — ABNORMAL LOW (ref 30.0–36.0)
MCV: 68.8 fL — ABNORMAL LOW (ref 80.0–100.0)
Platelets: 302 10*3/uL (ref 150–400)
RBC: 5.58 MIL/uL — ABNORMAL HIGH (ref 3.87–5.11)
RDW: 18.5 % — ABNORMAL HIGH (ref 11.5–15.5)
WBC: 11 10*3/uL — ABNORMAL HIGH (ref 4.0–10.5)
nRBC: 0 % (ref 0.0–0.2)

## 2023-12-18 LAB — HCG, QUANTITATIVE, PREGNANCY: hCG, Beta Chain, Quant, S: 199 m[IU]/mL — ABNORMAL HIGH (ref <5)

## 2023-12-18 NOTE — MAU Note (Addendum)
.  Laura Hernandez is a 33 y.o. at [redacted]w[redacted]d here in MAU reporting: light red spotting since 1800 last night - every time she wipes. Reports feeling generalized body soreness; slight lower abdominal cramping and lower back cramps. Reports last intercourse was yesterday before spotting occurred.   Onset of complaint: 1800 Pain score: 8 - body; 5 - abdomen; 7 - back  Vitals:   12/18/23 0527  BP: 125/71  Pulse: 68  Resp: 17  Temp: 98.3 F (36.8 C)  SpO2: 99%     FHT: NA   Lab orders placed from triage: UA

## 2023-12-18 NOTE — Discharge Instructions (Addendum)
 You were seen in the maternity assessment unit for vaginal bleeding.  This can be common in early pregnancy.  Your pregnancy hormone level is continuing to rise.  We recommend that you have a repeat pregnancy hormone test in 48 hours to ensure that it is continuing to rise at a normal progression.  You have also made you an appointment in the about 2 weeks from your last ultrasound on 4 March for a repeat ultrasound to assure progression of the pregnancy.  Please return to the maternity assessment unit if your bleeding worsens, you have severe abdominal pain, dizziness/lightheadedness or other concerns.   Pregnancy seems to be progressing normally.  We do need some additional information to ensure normal progression.  This is recommend a repeat pregnancy hormone in 48 hours and the ultrasound on the fourth will help.   Safe Medications in Pregnancy   Acne:  Benzoyl Peroxide  Salicylic Acid   Backache/Headache:  Tylenol: 2 regular strength every 4 hours OR               2 Extra strength every 6 hours   Colds/Coughs/Allergies:  Benadryl (alcohol free) 25 mg every 6 hours as needed  Breath right strips  Claritin  Cepacol throat lozenges  Chloraseptic throat spray  Cold-Eeze- up to three times per day  Cough drops, alcohol free  Flonase (by prescription only)  Guaifenesin  Mucinex  Robitussin DM (plain only, alcohol free)  Saline nasal spray/drops  Sudafed (pseudoephedrine) & Actifed * use only after [redacted] weeks gestation and if you do not have high blood pressure  Tylenol  Vicks Vaporub  Zinc lozenges  Zyrtec   Constipation:  Colace  Ducolax suppositories  Fleet enema  Glycerin suppositories  Metamucil  Milk of magnesia  Miralax  Senokot  Smooth move tea   Diarrhea:  Kaopectate  Imodium A-D   *NO pepto Bismol   Hemorrhoids:  Anusol  Anusol HC  Preparation H  Tucks   Indigestion:  Tums  Maalox  Mylanta  Zantac  Pepcid   Insomnia:  Benadryl (alcohol free)  25mg  every 6 hours as needed  Tylenol PM  Unisom, no Gelcaps   Leg Cramps:  Tums  MagGel   Nausea/Vomiting:  Bonine  Dramamine  Emetrol  Ginger extract  Sea bands  Meclizine  Nausea medication to take during pregnancy:  Unisom (doxylamine succinate 25 mg tablets) Take one tablet daily at bedtime. If symptoms are not adequately controlled, the dose can be increased to a maximum recommended dose of two tablets daily (1/2 tablet in the morning, 1/2 tablet mid-afternoon and one at bedtime).  Vitamin B6 100mg  tablets. Take one tablet twice a day (up to 200 mg per day).   Skin Rashes:  Aveeno products  Benadryl cream or 25mg  every 6 hours as needed  Calamine Lotion  1% cortisone cream   Yeast infection:  Gyne-lotrimin 7  Monistat 7    **If taking multiple medications, please check labels to avoid duplicating the same active ingredients  **take medication as directed on the label  ** Do not exceed 4000 mg of tylenol in 24 hours  **Do not take medications that contain aspirin or ibuprofen

## 2023-12-18 NOTE — MAU Provider Note (Signed)
 History     CSN: [id number]  Arrival date and time: 12/18/23 1308   Event Date/Time   First Provider Initiated Contact with Patient 12/18/23 [phone]      Chief Complaint  Patient presents with   Vaginal Bleeding   Vaginal Bleeding Pertinent negatives include no abdominal pain, back pain, chills, diarrhea, dysuria, fever, flank pain, nausea, rash, sore throat or vomiting.   Laura Hernandez is a 33 y.o. G3P1011 at [redacted]w[redacted]d presenting with bleeding after intercourse on 2/23. She reports she had sex and felt an sudden sharp pain during orgasm. She then noted some intermittent spotting and blood with wiping. She has lower abdominal cramping that mild in nature. She feels a heaviness in her lower abdomen as well.   Reports a history of CSx1   OB History     Gravida  3   Para  1   Term  1   Preterm      AB  1   Living  1      SAB  1   IAB      Ectopic      Multiple  0   Live Births  1           Past Medical History:  Diagnosis Date   Anemia    Bronchitis    Chlamydia    Gestational diabetes    History of gonorrhea 09/18/2013   2012     History of trichomonal vaginitis 09/18/2013   2012     HSV-2 (herpes simplex virus 2) infection 09/18/2013   Diagnosed 2013 with positive genital culture, refuses prophylaxis     Migraines 2012   Sciatic leg pain 09/09/2020   Situational mixed anxiety and depressive disorder 07/01/2020   UTI (urinary tract infection)     Past Surgical History:  Procedure Laterality Date   CESAREAN SECTION N/A 09/21/2020   Procedure: CESAREAN SECTION;  Surgeon: Rye Bing, MD;  Location: [facility];  Service: Obstetrics;  Laterality: N/A;    Family History  Problem Relation Age of Onset   Asthma Mother    Arthritis Mother    Diabetes Mother    Heart disease Mother        heart attack   Hypertension Mother    Stroke Mother    Kidney disease Mother        failured   Vision loss Mother    Other Father        did not get into cause of  death, stated no medical problems   Diabetes Maternal Grandmother    Hypertension Maternal Grandmother     Social History   Tobacco Use   Smoking status: Never   Smokeless tobacco: Never  Vaping Use   Vaping status: Never Used  Substance Use Topics   Alcohol use: Yes   Drug use: No    Allergies:  Allergies  Allergen Reactions   Latex Itching and Rash    No medications prior to admission.    Review of Systems  Constitutional:  Negative for chills and fever.  HENT:  Negative for congestion and sore throat.   Eyes:  Negative for pain and visual disturbance.  Respiratory:  Negative for cough, chest tightness and shortness of breath.   Cardiovascular:  Negative for chest pain.  Gastrointestinal:  Negative for abdominal pain, diarrhea, nausea and vomiting.  Endocrine: Negative for cold intolerance and heat intolerance.  Genitourinary:  Positive for vaginal bleeding. Negative for dysuria and flank pain.  Musculoskeletal:  Negative  for back pain.  Skin:  Negative for rash.  Allergic/Immunologic: Negative for food allergies.  Neurological:  Negative for dizziness and light-headedness.  Psychiatric/Behavioral:  Negative for agitation.    Physical Exam   Blood pressure 125/71, pulse 68, temperature 98.3 F (36.8 C), temperature source Oral, resp. rate 17, height 5' (1.524 m), weight 99.9 kg, last menstrual period 10/29/2023, SpO2 99%.  Physical Exam Vitals and nursing note reviewed.  Constitutional:      General: She is not in acute distress.    Appearance: She is well-developed.  HENT:     Head: Normocephalic and atraumatic.  Eyes:     General: No scleral icterus.    Conjunctiva/sclera: Conjunctivae normal.  Cardiovascular:     Rate and Rhythm: Normal rate.  Pulmonary:     Effort: Pulmonary effort is normal.  Chest:     Chest wall: No tenderness.  Abdominal:     Palpations: Abdomen is soft.     Tenderness: There is no abdominal tenderness. There is no guarding or  rebound.  Genitourinary:    Vagina: Normal.  Musculoskeletal:        General: Normal range of motion.     Cervical back: Normal range of motion and neck supple.  Skin:    General: Skin is warm and dry.     Findings: No rash.  Neurological:     Mental Status: She is alert and oriented to person, place, and time.     MAU Course  Procedures  MDM- Moderate  This is a pregnancy of unknown location.  Patient was interviewed   Reviewed patient records form outpatient office and MAU. She was evaluated from PUL and her bhcg was 31 and rose to 69. Her [place] on 2/18 did not show IUP yet but corresponding bHCG was only 31 on that day.   Results for orders placed or performed during the hospital encounter of 12/18/23 (from the past 24 hours)  Urinalysis, Routine w reflex microscopic -Urine, Clean Catch     Status: Abnormal   Collection Time: 12/18/23  5:32 AM  Result Value Ref Range   Color, Urine YELLOW YELLOW   APPearance CLEAR CLEAR   Specific Gravity, Urine 1.015 1.005 - 1.030   pH 7.0 5.0 - 8.0   Glucose, UA NEGATIVE NEGATIVE mg/dL   Hgb urine dipstick LARGE (A) NEGATIVE   Bilirubin Urine NEGATIVE NEGATIVE   Ketones, ur NEGATIVE NEGATIVE mg/dL   Protein, ur NEGATIVE NEGATIVE mg/dL   Nitrite NEGATIVE NEGATIVE   Leukocytes,Ua NEGATIVE NEGATIVE   RBC / HPF 21-50 0 - 5 RBC/hpf   WBC, UA 0-5 0 - 5 WBC/hpf   Bacteria, UA RARE (A) NONE SEEN   Squamous Epithelial / HPF 0-5 0 - 5 /HPF  hCG, quantitative, pregnancy     Status: Abnormal   Collection Time: 12/18/23  6:11 AM  Result Value Ref Range   hCG, Beta Chain, Quant, S 199 (H) <5 mIU/mL  CBC     Status: Abnormal   Collection Time: 12/18/23  6:11 AM  Result Value Ref Range   WBC 11.0 (H) 4.0 - 10.5 K/uL   RBC 5.58 (H) 3.87 - 5.11 MIL/uL   Hemoglobin 11.4 (L) 12.0 - 15.0 g/dL   HCT 29.5 28.4 - 13.2 %   MCV 68.8 (L) 80.0 - 100.0 fL   MCH 20.4 (L) 26.0 - 34.0 pg   MCHC 29.7 (L) 30.0 - 36.0 g/dL   RDW 44.0 (H) 10.2 - 72.5 %  Platelets 302 150 - 400 K/uL   nRBC 0.0 0.0 - 0.2 %     Assessment and Plan   1. Pregnancy of unknown anatomic location   2. Cramping affecting pregnancy, antepartum   3. Postcoital bleeding   4. Bleeding in early pregnancy    - Pregnancy hormone is increasing and still low, [place] not as useful at these low levels.  - Recommend repeat BHCG in 48 hours to assure appropriate rising of the value - [place] ordered fro 14 days from prior to document progression of pregnancy - Reviewed return precautions in detail  Future Appointments  Date Time Provider Department Center  12/20/2023 10:35 AM [facility] [place]  12/26/2023  8:15 AM [facility]    Isa Rankin Laura Hernandez 12/18/2023, 8:00 AM

## 2023-12-20 ENCOUNTER — Telehealth: Payer: Self-pay | Admitting: *Deleted

## 2023-12-20 ENCOUNTER — Ambulatory Visit: Payer: Self-pay

## 2023-12-20 ENCOUNTER — Ambulatory Visit: Payer: Medicaid Other | Admitting: *Deleted

## 2023-12-20 VITALS — BP 129/75 | HR 90 | Ht 61.0 in | Wt 220.3 lb

## 2023-12-20 DIAGNOSIS — O3680X0 Pregnancy with inconclusive fetal viability, not applicable or unspecified: Secondary | ICD-10-CM

## 2023-12-20 DIAGNOSIS — O209 Hemorrhage in early pregnancy, unspecified: Secondary | ICD-10-CM

## 2023-12-20 DIAGNOSIS — Z3A01 Less than 8 weeks gestation of pregnancy: Secondary | ICD-10-CM

## 2023-12-20 LAB — BETA HCG QUANT (REF LAB): hCG Quant: 109 m[IU]/mL

## 2023-12-20 NOTE — Telephone Encounter (Signed)
 Called pt and left message on voicemail stating that she missed appt today @ 10:35.  Also stated the importance of being seen today (most preferred) or tomorrow. She may call our office to reschedule

## 2023-12-20 NOTE — Progress Notes (Signed)
 Pt presents for stat BHCG.  She reports having vaginal spotting which is more frequent than when @ MAU on 2/24 but not larger in amount.  She also reports having the same amount of abdominal cramps - such as light menstrual cramps. Pt was advised to return to MAU if her bleeding becomes heavier or abdominal pain worsens. Stat BHCG drawn and she will be notified later today of test results and next steps in care. She stated that a detailed message can be left on voicemail if she does not answer. Pt already has follow up ultrasound scheduled on 3/4 @ 8:35 am as recommended by Dr. Alvester Morin.

## 2023-12-21 ENCOUNTER — Inpatient Hospital Stay (HOSPITAL_COMMUNITY)
Admission: AD | Admit: 2023-12-21 | Discharge: 2023-12-21 | Disposition: A | Discharge status: Home / Self Care | Payer: Self-pay | Attending: Obstetrics & Gynecology | Admitting: Obstetrics & Gynecology

## 2023-12-21 DIAGNOSIS — O209 Hemorrhage in early pregnancy, unspecified: Secondary | ICD-10-CM | POA: Insufficient documentation

## 2023-12-21 DIAGNOSIS — O200 Threatened abortion: Secondary | ICD-10-CM

## 2023-12-21 DIAGNOSIS — O4690 Antepartum hemorrhage, unspecified, unspecified trimester: Secondary | ICD-10-CM

## 2023-12-21 DIAGNOSIS — Z8632 Personal history of gestational diabetes: Secondary | ICD-10-CM | POA: Diagnosis not present

## 2023-12-21 DIAGNOSIS — Z3A01 Less than 8 weeks gestation of pregnancy: Secondary | ICD-10-CM | POA: Insufficient documentation

## 2023-12-21 DIAGNOSIS — R42 Dizziness and giddiness: Secondary | ICD-10-CM | POA: Diagnosis present

## 2023-12-21 DIAGNOSIS — R059 Cough, unspecified: Secondary | ICD-10-CM | POA: Diagnosis present

## 2023-12-21 DIAGNOSIS — O3680X0 Pregnancy with inconclusive fetal viability, not applicable or unspecified: Secondary | ICD-10-CM | POA: Diagnosis not present

## 2023-12-21 LAB — CBC WITH DIFFERENTIAL/PLATELET
Abs Immature Granulocytes: 0.05 10*3/uL (ref 0.00–0.07)
Basophils Absolute: 0 10*3/uL (ref 0.0–0.1)
Basophils Relative: 0 %
Eosinophils Absolute: 0.1 10*3/uL (ref 0.0–0.5)
Eosinophils Relative: 1 %
HCT: 37.3 % (ref 36.0–46.0)
Hemoglobin: 11.4 g/dL — ABNORMAL LOW (ref 12.0–15.0)
Immature Granulocytes: 0 %
Lymphocytes Relative: 19 %
Lymphs Abs: 2.2 10*3/uL (ref 0.7–4.0)
MCH: 20.6 pg — ABNORMAL LOW (ref 26.0–34.0)
MCHC: 30.6 g/dL (ref 30.0–36.0)
MCV: 67.5 fL — ABNORMAL LOW (ref 80.0–100.0)
Monocytes Absolute: 0.6 10*3/uL (ref 0.1–1.0)
Monocytes Relative: 5 %
Neutro Abs: 8.8 10*3/uL — ABNORMAL HIGH (ref 1.7–7.7)
Neutrophils Relative %: 75 %
Platelets: 313 10*3/uL (ref 150–400)
RBC: 5.53 MIL/uL — ABNORMAL HIGH (ref 3.87–5.11)
RDW: 18.3 % — ABNORMAL HIGH (ref 11.5–15.5)
Smear Review: NORMAL
WBC: 11.6 10*3/uL — ABNORMAL HIGH (ref 4.0–10.5)
nRBC: 0 % (ref 0.0–0.2)

## 2023-12-21 LAB — COMPREHENSIVE METABOLIC PANEL
ALT: 27 U/L (ref 0–44)
AST: 16 U/L (ref 15–41)
Albumin: 3.3 g/dL — ABNORMAL LOW (ref 3.5–5.0)
Alkaline Phosphatase: 60 U/L (ref 38–126)
Anion gap: 8 (ref 5–15)
BUN: 8 mg/dL (ref 6–20)
CO2: 26 mmol/L (ref 22–32)
Calcium: 8.9 mg/dL (ref 8.9–10.3)
Chloride: 101 mmol/L (ref 98–111)
Creatinine, Ser: 0.8 mg/dL (ref 0.44–1.00)
GFR, Estimated: >60 mL/min (ref >60)
Glucose, Bld: 104 mg/dL — ABNORMAL HIGH (ref 70–99)
Potassium: 3.9 mmol/L (ref 3.5–5.1)
Sodium: 135 mmol/L (ref 135–145)
Total Bilirubin: 0.5 mg/dL (ref 0.0–1.2)
Total Protein: 7.1 g/dL (ref 6.5–8.1)

## 2023-12-21 LAB — URINALYSIS, ROUTINE W REFLEX MICROSCOPIC
Bacteria, UA: NONE SEEN
Bilirubin Urine: NEGATIVE
Glucose, UA: NEGATIVE mg/dL
Ketones, ur: NEGATIVE mg/dL
Leukocytes,Ua: NEGATIVE
Nitrite: NEGATIVE
Protein, ur: NEGATIVE mg/dL
Specific Gravity, Urine: 1.004 — ABNORMAL LOW (ref 1.005–1.030)
pH: 7 (ref 5.0–8.0)

## 2023-12-21 LAB — GLUCOSE, CAPILLARY: Glucose-Capillary: 113 mg/dL — ABNORMAL HIGH (ref 70–99)

## 2023-12-21 LAB — HCG, QUANTITATIVE, PREGNANCY: hCG, Beta Chain, Quant, S: 104 m[IU]/mL — ABNORMAL HIGH (ref <5)

## 2023-12-21 LAB — HEMOGLOBIN A1C
Hgb A1c MFr Bld: 5.9 % — ABNORMAL HIGH (ref 4.8–5.6)
Mean Plasma Glucose: 122.63 mg/dL

## 2023-12-21 MED ORDER — IBUPROFEN 800 MG PO TABS: 800.0000 mg | ORAL_TABLET | Freq: Three times a day (TID) | ORAL | 0 refills | Status: DC | PRN

## 2023-12-21 NOTE — MAU Provider Note (Signed)
 History     CSN: [id number]  Arrival date and time: 12/21/23 1239   Event Date/Time   First Provider Initiated Contact with Patient 12/21/23 1411      Chief Complaint  Patient presents with   Dizziness   Generalized Body Aches   Vaginal Bleeding   Cough   HPI  Ms.Laura Hernandez is a 33 y.o. female G3P1011 @ [redacted]w[redacted]d here in MAU with vaginal bleeding. The bleeding started yesterday and was similar to menstrual bleeding. She overall feels bad, dizziness, fatigue and body aches.  She was seen yesterday at the office for a f/u Quant however did not hear from the office. Reviewed the Quants in detail with the patient today. She reports off and on lower abdominal pain that feels similar to a menstrual cycle.   Quant 2/18: 31 Quant 2/20: 69 Quant 2/24: 199 Quant 2/26: 109 Quant 2/27: 104  History of GDM and ? type 2 DM, stopped taking metformin recently. Has not seen PCP   OB History     Gravida  3   Para  1   Term  1   Preterm      AB  1   Living  1      SAB  1   IAB      Ectopic      Multiple  0   Live Births  1           Past Medical History:  Diagnosis Date   Anemia    Bronchitis    Chlamydia    Gestational diabetes    History of gonorrhea 09/18/2013   2012     History of trichomonal vaginitis 09/18/2013   2012     HSV-2 (herpes simplex virus 2) infection 09/18/2013   Diagnosed 2013 with positive genital culture, refuses prophylaxis     Migraines 2012   Sciatic leg pain 09/09/2020   Situational mixed anxiety and depressive disorder 07/01/2020   UTI (urinary tract infection)     Past Surgical History:  Procedure Laterality Date   CESAREAN SECTION N/A 09/21/2020   Procedure: CESAREAN SECTION;  Surgeon: [place] Bing, MD;  Location: [facility];  Service: Obstetrics;  Laterality: N/A;    Family History  Problem Relation Age of Onset   Asthma Mother    Arthritis Mother    Diabetes Mother    Heart disease Mother        heart attack    Hypertension Mother    Stroke Mother    Kidney disease Mother        failured   Vision loss Mother    Other Father        did not get into cause of death, stated no medical problems   Diabetes Maternal Grandmother    Hypertension Maternal Grandmother     Social History   Tobacco Use   Smoking status: Never   Smokeless tobacco: Never  Vaping Use   Vaping status: Never Used  Substance Use Topics   Alcohol use: Yes   Drug use: No    Allergies:  Allergies  Allergen Reactions   Latex Itching and Rash    Medications Prior to Admission  Medication Sig Dispense Refill Last Dose/Taking   acetaminophen (TYLENOL) 500 MG tablet Take 500 mg by mouth every 6 (six) hours as needed.      cefadroxil (DURICEF) 500 MG capsule Take 1 capsule (500 mg total) by mouth 2 (two) times daily for 7 days. 14 capsule 0  cetirizine (ZYRTEC) 10 MG tablet Take 10 mg by mouth daily.      metformin (FORTAMET) 500 MG (OSM) 24 hr tablet Take 500 mg by mouth at bedtime.      Prenatal Vit-Fe Fumarate-FA (PREPLUS) 27-1 MG TABS Take 1 tablet by mouth daily.      Results for orders placed or performed during the hospital encounter of 12/21/23 (from the past 48 hours)  Glucose, capillary     Status: Abnormal   Collection Time: 12/21/23  1:27 PM  Result Value Ref Range   Glucose-Capillary 113 (H) 70 - 99 mg/dL    Comment: Glucose reference range applies only to samples taken after fasting for at least 8 hours.  CBC with Differential/Platelet     Status: Abnormal   Collection Time: 12/21/23  1:30 PM  Result Value Ref Range   WBC 11.6 (H) 4.0 - 10.5 K/uL   RBC 5.53 (H) 3.87 - 5.11 MIL/uL   Hemoglobin 11.4 (L) 12.0 - 15.0 g/dL   HCT 40.9 81.1 - 91.4 %   MCV 67.5 (L) 80.0 - 100.0 fL   MCH 20.6 (L) 26.0 - 34.0 pg   MCHC 30.6 30.0 - 36.0 g/dL   RDW 78.2 (H) 95.6 - 21.3 %   Platelets 313 150 - 400 K/uL    Comment: REPEATED TO VERIFY   nRBC 0.0 0.0 - 0.2 %   Neutrophils Relative % 75 %   Neutro Abs 8.8 (H)  1.7 - 7.7 K/uL   Lymphocytes Relative 19 %   Lymphs Abs 2.2 0.7 - 4.0 K/uL   Monocytes Relative 5 %   Monocytes Absolute 0.6 0.1 - 1.0 K/uL   Eosinophils Relative 1 %   Eosinophils Absolute 0.1 0.0 - 0.5 K/uL   Basophils Relative 0 %   Basophils Absolute 0.0 0.0 - 0.1 K/uL   WBC Morphology MORPHOLOGY UNREMARKABLE    RBC Morphology See Note    Smear Review Normal platelet morphology    Immature Granulocytes 0 %   Abs Immature Granulocytes 0.05 0.00 - 0.07 K/uL   Polychromasia PRESENT     Comment: Performed at [facility], [address], [place], [place] [address]  Comprehensive metabolic panel     Status: Abnormal   Collection Time: 12/21/23  1:30 PM  Result Value Ref Range   Sodium 135 135 - 145 mmol/L   Potassium 3.9 3.5 - 5.1 mmol/L   Chloride 101 98 - 111 mmol/L   CO2 26 22 - 32 mmol/L   Glucose, Bld 104 (H) 70 - 99 mg/dL    Comment: Glucose reference range applies only to samples taken after fasting for at least 8 hours.   BUN 8 6 - 20 mg/dL   Creatinine, Ser 8.46 0.44 - 1.00 mg/dL   Calcium 8.9 8.9 - 96.2 mg/dL   Total Protein 7.1 6.5 - 8.1 g/dL   Albumin 3.3 (L) 3.5 - 5.0 g/dL   AST 16 15 - 41 U/L   ALT 27 0 - 44 U/L   Alkaline Phosphatase 60 38 - 126 U/L   Total Bilirubin 0.5 0.0 - 1.2 mg/dL   GFR, Estimated >95 >28 mL/min    Comment: (NOTE) Calculated using the CKD-EPI Creatinine Equation (2021)    Anion gap 8 5 - 15    Comment: Performed at [facility], [address], [place], [place] [address]  hCG, quantitative, pregnancy     Status: Abnormal   Collection Time: 12/21/23  1:30 PM  Result Value Ref Range   hCG, Beta Chain, Quant, S 104 (H) <5 mIU/mL    Comment:          GEST. AGE      CONC.  (mIU/mL)   <=1 WEEK        5 - 50     2 WEEKS       50 - 500     3 WEEKS       100 - 10,000     4 WEEKS     1,000 - 30,000     5 WEEKS     3,500 - 115,000   6-8 WEEKS     12,000 - 270,000    12 WEEKS     15,000 - 220,000        FEMALE AND NON-PREGNANT  FEMALE:     LESS THAN 5 mIU/mL Performed at [facility], [address] [place], [place] [address]   Hemoglobin A1c     Status: Abnormal   Collection Time: 12/21/23  1:30 PM  Result Value Ref Range   Hgb A1c MFr Bld 5.9 (H) 4.8 - 5.6 %    Comment: (NOTE) Pre diabetes:          5.7%-6.4%  Diabetes:              >6.4%  Glycemic control for   <7.0% adults with diabetes    Mean Plasma Glucose 122.63 mg/dL    Comment: Performed at [facility], [address] [place], [place] [address]  Urinalysis, Routine w reflex microscopic -Urine, Clean Catch     Status: Abnormal   Collection Time: 12/21/23  1:37 PM  Result Value Ref Range   Color, Urine STRAW (A) YELLOW   APPearance CLEAR CLEAR   Specific Gravity, Urine 1.004 (L) 1.005 - 1.030   pH 7.0 5.0 - 8.0   Glucose, UA NEGATIVE NEGATIVE mg/dL   Hgb urine dipstick MODERATE (A) NEGATIVE   Bilirubin Urine NEGATIVE NEGATIVE   Ketones, ur NEGATIVE NEGATIVE mg/dL   Protein, ur NEGATIVE NEGATIVE mg/dL   Nitrite NEGATIVE NEGATIVE   Leukocytes,Ua NEGATIVE NEGATIVE   RBC / HPF 0-5 0 - 5 RBC/hpf   WBC, UA 0-5 0 - 5 WBC/hpf   Bacteria, UA NONE SEEN NONE SEEN   Squamous Epithelial / HPF 0-5 0 - 5 /HPF    Comment: Performed at [facility], [address] [place], [place] [address]    Review of Systems  Gastrointestinal:  Positive for abdominal pain.  Genitourinary:  Positive for vaginal bleeding.   Physical Exam   Blood pressure 116/71, pulse 91, temperature 98.5 F (36.9 C), temperature source Oral, resp. rate 19, last menstrual period 10/29/2023, SpO2 100%.  Physical Exam Constitutional:      General: She is not in acute distress.    Appearance: Normal appearance. She is not ill-appearing, toxic-appearing or diaphoretic.  Genitourinary:    Comments: Bimanual exam: Cervix anterior and closed Small amount of dark red blood noted on exam glove. Exam by Venia Carbon, NP  Skin:    General: Skin is warm.   Neurological:     Mental Status: She is alert and oriented to person, place, and time.  Psychiatric:        Mood and Affect: Mood normal.    MAU Course  Procedures  MDM  O positive blood type CBC, CMP, Hcg collected in MAU Offered patient pain medication and she declined  Discussed patient  with Dr. Macon Large who is agreeable with the plan of care   Assessment and Plan    A:  1. Pregnancy of unknown anatomic location   2. Vaginal bleeding in pregnancy   3. Threatened miscarriage      P:  Dc home Message sent to [facility] for follow up Quant next week Discussed with the patient the importance of following the quant down to 0 Return to MAU if symptoms worsen Rx: Ibuprofen Bleeding precautions  Clary Boulais, Harolyn Rutherford, NP 12/21/2023 7:42 PM

## 2023-12-21 NOTE — MAU Note (Addendum)
...  Laura Hernandez is a 33 y.o. at [redacted]w[redacted]d here in MAU reporting: Dizziness, increased vaginal bleeding, body aches x2 days, and cough for two weeks. She reports the bleeding increased today and she began feeling dizzy one hour ago. Denies passing blood clots. She reports she is not wearing a pad and reports the blood is slightly getting onto her underwear. Last IC this past Sunday. Patient hardly able to hold her head up due to reported dizziness.  T2DM - Metformin. Has not taken it in one week. Last ate a burger at 1000. POCT CBG 113 in triage. Last took 500 mg of Tylenol at 0800.  Pain score: 9/10 lower abdomen  Vitals:   12/21/23 1329  BP: 127/65  Pulse: 89  Resp: 19  Temp: 98.5 F (36.9 C)  SpO2: 100%      FHT: n/a Lab orders placed from triage: UA, POCT CBG, Orthostatic VS

## 2023-12-21 NOTE — MAU Note (Signed)
 Patient unable to complete standing at 3 minutes portion of Orthostatic VS's due to reports of severe thigh pain.

## 2023-12-22 ENCOUNTER — Encounter: Payer: Self-pay | Admitting: Family Medicine

## 2023-12-25 ENCOUNTER — Telehealth: Payer: Self-pay | Admitting: *Deleted

## 2023-12-25 NOTE — Telephone Encounter (Signed)
 Called pt regarding upcoming appointments. I stated that she will not need [place] as scheduled tomorrow since she was seen @ hospital on 2/27 and felt to have threatened miscarriage.  Pt stated that she continued to bleed after 2/27 hospital visit and passed a clot on 3/1. She feels she has fully had a miscarriage. I conveyed condolences for her loss. Pt was advised she will need to keep lab appt on 3/10 @ 10:30 as scheduled so that we can follow her hormone level down to a non-pregnant value.  Pt voiced understanding.

## 2023-12-26 ENCOUNTER — Other Ambulatory Visit: Payer: Medicaid Other

## 2023-12-31 ENCOUNTER — Other Ambulatory Visit: Payer: Self-pay

## 2023-12-31 DIAGNOSIS — O039 Complete or unspecified spontaneous abortion without complication: Secondary | ICD-10-CM

## 2024-01-01 ENCOUNTER — Other Ambulatory Visit

## 2024-01-25 ENCOUNTER — Ambulatory Visit (HOSPITAL_COMMUNITY)

## 2024-02-22 ENCOUNTER — Other Ambulatory Visit

## 2024-02-27 ENCOUNTER — Ambulatory Visit: Admitting: "Endocrinology

## 2024-03-22 ENCOUNTER — Encounter (HOSPITAL_COMMUNITY): Payer: Self-pay | Admitting: Obstetrics and Gynecology

## 2024-03-22 ENCOUNTER — Inpatient Hospital Stay (HOSPITAL_COMMUNITY)
Admission: AD | Admit: 2024-03-22 | Discharge: 2024-03-22 | Disposition: A | Discharge status: Home / Self Care | Attending: Obstetrics and Gynecology | Admitting: Obstetrics and Gynecology

## 2024-03-22 DIAGNOSIS — Z3202 Encounter for pregnancy test, result negative: Secondary | ICD-10-CM | POA: Diagnosis not present

## 2024-03-22 DIAGNOSIS — N644 Mastodynia: Secondary | ICD-10-CM | POA: Diagnosis present

## 2024-03-22 DIAGNOSIS — R109 Unspecified abdominal pain: Secondary | ICD-10-CM | POA: Diagnosis not present

## 2024-03-22 DIAGNOSIS — Z789 Other specified health status: Secondary | ICD-10-CM

## 2024-03-22 LAB — POCT PREGNANCY, URINE: Preg Test, Ur: NEGATIVE

## 2024-03-22 NOTE — MAU Note (Signed)
 Pt says this preg ended in a SAB in Feb 27th. But never went back for follow up labs . She did a UPT on Monday - neg .  But tonight came here for breast pain- started 1-2 weeks ago - both  are tender and sore. Took Tylenol  on Tuesday - none today . Has used  warm and cold compresses. Feels lower abd cramping - started Tuesday - 7/10 Pain now 8/10- no meds for pain.  Back pain - started Mon- Took XS Tyl on Monday - none today . 9/10

## 2024-03-22 NOTE — MAU Provider Note (Signed)
  S Laura Hernandez is a 33 y.o. [phone] patient who presents to MAU today with complaint of breast pain, lower abdominal cramping, and back pain. She reports she is not yet due for her regular menses which should be ~ 03/30/24 but due to her symptoms she took a home UPT which was negative. Her test was repeated here in MAU at it is also negative. Patient referred to ED for further evaluation of her concerns since she is not pregnant and has not had any pregnancy tests that were positive. She verbalized understanding and agrees with the current plan of care     O BP 109/69 (BP Location: Right Arm)   Pulse 83   Temp 98.1 F (36.7 C) (Oral)   Resp 12   Wt 98.7 kg   BMI 41.10 kg/m  Physical Exam Vitals and nursing note reviewed.  Constitutional:      General: She is not in acute distress.    Appearance: She is well-developed. She is obese. She is not ill-appearing.  HENT:     Head: Normocephalic.  Cardiovascular:     Rate and Rhythm: Normal rate.  Pulmonary:     Effort: Pulmonary effort is normal.  Abdominal:     Palpations: Abdomen is soft.     Tenderness: There is no abdominal tenderness.  Skin:    General: Skin is warm.  Neurological:     Mental Status: She is alert and oriented to person, place, and time.  Psychiatric:        Mood and Affect: Mood normal.        Behavior: Behavior normal.     MDM  LOW   UPT - Negative  No evidence of pregnancy at this time    I have reviewed the patient chart and performed the physical exam  Counseling and education provided and patient agreeable  with plan as described below. Verbalized understanding.    ASSESSMENT Medical screening exam complete 1. Not currently pregnant (Primary)    PLAN Future Appointments  Date Time Provider Department Center  05/07/2024  2:20 PM Jorge Newcomer, MD [id number] None    Discharge from MAU in stable condition  See AVS for full description of educational information and instructions  provided to the patient at time of discharge  List of options for follow-up given   Warning signs for worsening condition that would warrant emergency follow-up discussed   Cherlynn Cornfield, NP 03/22/2024 8:46 PM

## 2024-05-07 ENCOUNTER — Ambulatory Visit: Admitting: "Endocrinology

## 2024-06-17 ENCOUNTER — Ambulatory Visit (HOSPITAL_COMMUNITY)

## 2024-06-19 ENCOUNTER — Ambulatory Visit (HOSPITAL_COMMUNITY)

## 2024-09-05 ENCOUNTER — Ambulatory Visit: Admitting: Obstetrics and Gynecology

## 2024-09-26 ENCOUNTER — Ambulatory Visit: Admitting: Obstetrics and Gynecology

## 2024-10-28 ENCOUNTER — Inpatient Hospital Stay (HOSPITAL_COMMUNITY)
Admission: AD | Admit: 2024-10-28 | Discharge: 2024-10-28 | Discharge status: Against Medical Advice | Attending: Obstetrics & Gynecology | Admitting: Obstetrics & Gynecology

## 2024-10-28 ENCOUNTER — Encounter (HOSPITAL_COMMUNITY): Payer: Self-pay | Admitting: Obstetrics & Gynecology

## 2024-10-28 DIAGNOSIS — Z5329 Procedure and treatment not carried out because of patient's decision for other reasons: Secondary | ICD-10-CM | POA: Diagnosis present

## 2024-10-28 DIAGNOSIS — R109 Unspecified abdominal pain: Secondary | ICD-10-CM | POA: Diagnosis present

## 2024-10-28 LAB — URINALYSIS, ROUTINE W REFLEX MICROSCOPIC
Bilirubin Urine: NEGATIVE
Glucose, UA: NEGATIVE mg/dL
Hgb urine dipstick: NEGATIVE
Ketones, ur: NEGATIVE mg/dL
Nitrite: NEGATIVE
Protein, ur: NEGATIVE mg/dL
Specific Gravity, Urine: 1.026 (ref 1.005–1.030)
pH: 6 (ref 5.0–8.0)

## 2024-10-28 LAB — POCT PREGNANCY, URINE: Preg Test, Ur: POSITIVE — AB

## 2024-10-28 LAB — WET PREP, GENITAL
Clue Cells Wet Prep HPF POC: NONE SEEN
Sperm: NONE SEEN
Trich, Wet Prep: NONE SEEN
WBC, Wet Prep HPF POC: <10
Yeast Wet Prep HPF POC: NONE SEEN

## 2024-10-28 NOTE — MAU Note (Signed)
 Pt says she has done 4 HPT - all positive  Pt says started having lower abd pain on Friday - 9/10- no pain meds  Now pain is 7/10 No VB

## 2024-10-29 LAB — GC/CHLAMYDIA PROBE AMP (~~LOC~~) NOT AT ARMC
Chlamydia: NEGATIVE
Comment: NEGATIVE
Comment: NORMAL
Neisseria Gonorrhea: NEGATIVE

## 2024-10-30 ENCOUNTER — Ambulatory Visit: Payer: Self-pay | Admitting: Obstetrics & Gynecology

## 2024-11-01 ENCOUNTER — Inpatient Hospital Stay (HOSPITAL_COMMUNITY)

## 2024-11-01 ENCOUNTER — Encounter (HOSPITAL_COMMUNITY): Payer: Self-pay | Admitting: Obstetrics & Gynecology

## 2024-11-01 ENCOUNTER — Inpatient Hospital Stay (HOSPITAL_COMMUNITY)
Admission: EM | Admit: 2024-11-01 | Discharge: 2024-11-01 | Disposition: A | Discharge status: Home / Self Care | Attending: Obstetrics & Gynecology | Admitting: Obstetrics & Gynecology

## 2024-11-01 DIAGNOSIS — R109 Unspecified abdominal pain: Secondary | ICD-10-CM | POA: Diagnosis present

## 2024-11-01 DIAGNOSIS — Z3201 Encounter for pregnancy test, result positive: Secondary | ICD-10-CM

## 2024-11-01 DIAGNOSIS — Z743 Need for continuous supervision: Secondary | ICD-10-CM | POA: Diagnosis not present

## 2024-11-01 DIAGNOSIS — O26891 Other specified pregnancy related conditions, first trimester: Secondary | ICD-10-CM | POA: Insufficient documentation

## 2024-11-01 DIAGNOSIS — O09291 Supervision of pregnancy with other poor reproductive or obstetric history, first trimester: Secondary | ICD-10-CM | POA: Diagnosis not present

## 2024-11-01 DIAGNOSIS — Z91040 Latex allergy status: Secondary | ICD-10-CM | POA: Diagnosis not present

## 2024-11-01 DIAGNOSIS — R1032 Left lower quadrant pain: Secondary | ICD-10-CM | POA: Diagnosis not present

## 2024-11-01 DIAGNOSIS — O3680X0 Pregnancy with inconclusive fetal viability, not applicable or unspecified: Secondary | ICD-10-CM | POA: Insufficient documentation

## 2024-11-01 DIAGNOSIS — Z3A01 Less than 8 weeks gestation of pregnancy: Secondary | ICD-10-CM | POA: Insufficient documentation

## 2024-11-01 DIAGNOSIS — R1084 Generalized abdominal pain: Secondary | ICD-10-CM | POA: Diagnosis not present

## 2024-11-01 LAB — CBC
HCT: 37.8 % (ref 36.0–46.0)
Hemoglobin: 11.1 g/dL — ABNORMAL LOW (ref 12.0–15.0)
MCH: 20.6 pg — ABNORMAL LOW (ref 26.0–34.0)
MCHC: 29.4 g/dL — ABNORMAL LOW (ref 30.0–36.0)
MCV: 70 fL — ABNORMAL LOW (ref 80.0–100.0)
Platelets: 338 K/uL (ref 150–400)
RBC: 5.4 MIL/uL — ABNORMAL HIGH (ref 3.87–5.11)
RDW: 18.3 % — ABNORMAL HIGH (ref 11.5–15.5)
WBC: 11.5 K/uL — ABNORMAL HIGH (ref 4.0–10.5)
nRBC: 0 % (ref 0.0–0.2)

## 2024-11-01 LAB — URINALYSIS, ROUTINE W REFLEX MICROSCOPIC
Bilirubin Urine: NEGATIVE
Glucose, UA: NEGATIVE mg/dL
Hgb urine dipstick: NEGATIVE
Ketones, ur: NEGATIVE mg/dL
Nitrite: NEGATIVE
Protein, ur: NEGATIVE mg/dL
Specific Gravity, Urine: 1.02 (ref 1.005–1.030)
pH: 6 (ref 5.0–8.0)

## 2024-11-01 LAB — COMPREHENSIVE METABOLIC PANEL WITH GFR
ALT: 12 U/L (ref 0–44)
AST: 14 U/L — ABNORMAL LOW (ref 15–41)
Albumin: 3.8 g/dL (ref 3.5–5.0)
Alkaline Phosphatase: 76 U/L (ref 38–126)
Anion gap: 10 (ref 5–15)
BUN: 9 mg/dL (ref 6–20)
CO2: 23 mmol/L (ref 22–32)
Calcium: 9.6 mg/dL (ref 8.9–10.3)
Chloride: 101 mmol/L (ref 98–111)
Creatinine, Ser: 0.8 mg/dL (ref 0.44–1.00)
GFR, Estimated: >60 mL/min
Glucose, Bld: 128 mg/dL — ABNORMAL HIGH (ref 70–99)
Potassium: 4.2 mmol/L (ref 3.5–5.1)
Sodium: 135 mmol/L (ref 135–145)
Total Bilirubin: 0.3 mg/dL (ref 0.0–1.2)
Total Protein: 7.9 g/dL (ref 6.5–8.1)

## 2024-11-01 LAB — LIPASE, BLOOD: Lipase: 61 U/L — ABNORMAL HIGH (ref 11–51)

## 2024-11-01 LAB — ABO/RH: ABO/RH(D): B POS

## 2024-11-01 LAB — HCG, SERUM, QUALITATIVE: Preg, Serum: POSITIVE — AB

## 2024-11-01 LAB — HCG, QUANTITATIVE, PREGNANCY: hCG, Beta Chain, Quant, S: 1269 m[IU]/mL — ABNORMAL HIGH

## 2024-11-01 MED ORDER — ACETAMINOPHEN 500 MG PO TABS
1000.0000 mg | ORAL_TABLET | Freq: Once | ORAL | Status: AC
  Administered 2024-11-01: 1000 mg via ORAL
  Filled 2024-11-01: qty 2

## 2024-11-01 NOTE — MAU Provider Note (Signed)
 " History     CSN: [id number]  Arrival date and time: 11/01/24 1628 First Provider Initiated Contact with Patient 11/01/2024  5:15 PM  Chief Complaint  Patient presents with   Abdominal Cramping    HPI Laura Hernandez is a 34 y.o. [id number] at [redacted]w[redacted]d, 06/27/2025, by Last Menstrual Period, who presents to the Maternity Assessment Unit from [facility] ED for TVUS.   Patient reports R sided abdominal pain started yesterday and has been getting worse.  It is right sided and radiates her vagina and upper buttock.  It is described as stabbing and excruciating.  She tried Tylenol  which did offer some relief.  She volunteers that her last BM was 3 days ago, and she has stuff at home to manage that if that is the problem.  Of note, she was seen here 4 days ago and reported 3d of abd pain at that time. She LWOBS after being triaged.   ROS (+) Abdominal pain, gas pain, flatulence (-) VB, n/v, f/c   Past Medical History:  Diagnosis Date   Anemia    Bronchitis    Chlamydia    Gestational diabetes    History of gonorrhea 09/18/2013   2012     History of trichomonal vaginitis 09/18/2013   2012     HSV-2 (herpes simplex virus 2) infection 09/18/2013   Diagnosed 2013 with positive genital culture, refuses prophylaxis     Migraines 2012   Sciatic leg pain 09/09/2020   Situational mixed anxiety and depressive disorder 07/01/2020   UTI (urinary tract infection)    Past Surgical History:  Procedure Laterality Date   CESAREAN SECTION N/A 09/21/2020   Procedure: CESAREAN SECTION;  Surgeon: Izell Harari, MD;  Location: [facility] LD ORS;  Service: Obstetrics;  Laterality: N/A;   Allergies[1]  Physical Exam  BP 128/80   Pulse 75   Temp 99 F (37.2 C)   Resp 18   LMP 09/20/2024   SpO2 100%   Gen: alert, no acute distress CV: regular rate Resp: nonlabored Abd: tender in LLQ, periumbilical, and RUQ.   Labs --/--/B POS (01/09 1959)   Results for orders placed or performed during the hospital encounter  of 11/01/24 (from the past 24 hours)  Lipase, blood     Status: Abnormal   Collection Time: 11/01/24  5:37 PM  Result Value Ref Range   Lipase 61 (H) 11 - 51 U/L  Comprehensive metabolic panel     Status: Abnormal   Collection Time: 11/01/24  5:37 PM  Result Value Ref Range   Sodium 135 135 - 145 mmol/L   Potassium 4.2 3.5 - 5.1 mmol/L   Chloride 101 98 - 111 mmol/L   CO2 23 22 - 32 mmol/L   Glucose, Bld 128 (H) 70 - 99 mg/dL   BUN 9 6 - 20 mg/dL   Creatinine, Ser 9.19 0.44 - 1.00 mg/dL   Calcium  9.6 8.9 - 10.3 mg/dL   Total Protein 7.9 6.5 - 8.1 g/dL   Albumin 3.8 3.5 - 5.0 g/dL   AST 14 (L) 15 - 41 U/L   ALT 12 0 - 44 U/L   Alkaline Phosphatase 76 38 - 126 U/L   Total Bilirubin 0.3 0.0 - 1.2 mg/dL   GFR, Estimated >39 >39 mL/min   Anion gap 10 5 - 15  CBC     Status: Abnormal   Collection Time: 11/01/24  5:37 PM  Result Value Ref Range   WBC 11.5 (H) 4.0 - 10.5 K/uL  RBC 5.40 (H) 3.87 - 5.11 MIL/uL   Hemoglobin 11.1 (L) 12.0 - 15.0 g/dL   HCT 62.1 63.9 - 53.9 %   MCV 70.0 (L) 80.0 - 100.0 fL   MCH 20.6 (L) 26.0 - 34.0 pg   MCHC 29.4 (L) 30.0 - 36.0 g/dL   RDW 81.6 (H) 88.4 - 84.4 %   Platelets 338 150 - 400 K/uL   nRBC 0.0 0.0 - 0.2 %  hCG, serum, qualitative     Status: Abnormal   Collection Time: 11/01/24  5:37 PM  Result Value Ref Range   Preg, Serum POSITIVE (A) NEGATIVE  Urinalysis, Routine w reflex microscopic -Urine, Clean Catch     Status: Abnormal   Collection Time: 11/01/24  5:56 PM  Result Value Ref Range   Color, Urine YELLOW YELLOW   APPearance HAZY (A) CLEAR   Specific Gravity, Urine 1.020 1.005 - 1.030   pH 6.0 5.0 - 8.0   Glucose, UA NEGATIVE NEGATIVE mg/dL   Hgb urine dipstick NEGATIVE NEGATIVE   Bilirubin Urine NEGATIVE NEGATIVE   Ketones, ur NEGATIVE NEGATIVE mg/dL   Protein, ur NEGATIVE NEGATIVE mg/dL   Nitrite NEGATIVE NEGATIVE   Leukocytes,Ua SMALL (A) NEGATIVE   RBC / HPF 0-5 0 - 5 RBC/hpf   WBC, UA 0-5 0 - 5 WBC/hpf   Bacteria, UA  RARE (A) NONE SEEN   Squamous Epithelial / HPF 11-20 0 - 5 /HPF   Mucus PRESENT   ABO/Rh     Status: None   Collection Time: 11/01/24  7:59 PM  Result Value Ref Range   ABO/RH(D) B POS    No rh immune globuloin      NOT A RH IMMUNE GLOBULIN CANDIDATE, PT RH POSITIVE Performed at [facility], [address] [place], [place] [address]     Imaging US  OB Comp Less 14 Wks with OB Transvaginal 1. Questionable intrauterine gestational sac corresponding to a gestational age of [redacted] weeks and 0 days, without yolk sac or embryo identified. 2. Differential considerations include normal early pregnancy and anembryonic pregnancy. 3. Recommend follow-up with serial beta hCG and ultrasound.   Electronically signed by: Greig Pique MD   Assessment and Plan  MDM Laura Hernandez is a 34 y.o. [phone] at [redacted]w[redacted]d, 06/27/2025, by Last Menstrual Period, who presents to the MAU for abdominal pain. Ddx: abdominal pain includes but is not limited to: round ligament pain, labor, UTI, pyelonephritis, PID, cervicitis, placental abruption, chorioamnionitis and upper abdominal pain includes but is not limited to: cholecystitis, biliary colic, preeclampsia, appendicitis, abdominal hernia, constipation, pancreatitis, ACS, small bowel obstruction, perforation  Orders Placed This Encounter  Procedures   US  OB LESS THAN 14 WEEKS WITH OB TRANSVAGINAL   Lipase, blood   Comprehensive metabolic panel   CBC   Urinalysis, Routine w reflex microscopic -Urine, Clean Catch   hCG, serum, qualitative   hCG, quantitative, pregnancy   Diet NPO time specified   EKG   ABO/Rh   No orders of the defined types were placed in this encounter.  Pt feeling reassured after noted IUP. She had negative wet prep and GC/CT 4days ago. She is agreeable to try her usual regimen for constipation at home. She has IOB in 7 days at [facility]. She will return to MAU in 3 days for repeat beta.   1. Positive urine pregnancy test (Primary)  2.  Abdominal pain, unspecified abdominal location  Results pending at the time of DC: None Dispo: DC home in stable  condition with return precautions discussed and included in AVS.    Laura Maier, DO [facility] Fellow [facility] for [facility]     [1]  Allergies Allergen Reactions   Latex Itching and Rash   "

## 2024-11-01 NOTE — MAU Note (Signed)
 [facility] arrival form [facility]. C/O RLQ pain pain x 2 days that is sharp.rates pain 9/10 Feels nausea .ated but no vomiting. Deneis any vag bleeding or discharge.

## 2024-11-01 NOTE — ED Provider Notes (Signed)
 " Time EMERGENCY DEPARTMENT AT [facility] Provider Note   CSN: [id number] Arrival date & time: 11/01/24  8371     Patient presents with: Abdominal Cramping   Laura Hernandez is a 34 y.o. female.   Laura Hernandez is a [id number] presenting with acute abdominal pain that started this morning. Known positive pregnancy test without ultrasound confirmation. LMP 09/13/24 per patient. Abdominal pain concentrated to the left lower quadrant. Feels like her prior miscarriage last year. No vaginal bleeding. Reports having STI screening done at MAU. Denies abnormal vaginal discharge.    Abdominal Cramping Associated symptoms include abdominal pain.       Prior to Admission medications  Medication Sig Start Date End Date Taking? Authorizing Provider  acetaminophen  (TYLENOL ) 500 MG tablet Take 500 mg by mouth every 6 (six) hours as needed.    [provider]  cetirizine (ZYRTEC) 10 MG tablet Take 10 mg by mouth daily. 07/06/22   [provider]  ibuprofen  (ADVIL ) 800 MG tablet Take 1 tablet (800 mg total) by mouth every 8 (eight) hours as needed. 12/21/23   Rasch, Delon I, NP  metformin  (FORTAMET ) 500 MG (OSM) 24 hr tablet Take 500 mg by mouth at bedtime.    [provider]  Prenatal Vit-Fe Fumarate-FA (PREPLUS) 27-1 MG TABS Take 1 tablet by mouth daily. 12/12/23   Izell Harari, MD    Allergies: Latex    Review of Systems  Gastrointestinal:  Positive for abdominal distention and abdominal pain. Negative for nausea and vomiting.  Genitourinary:  Negative for vaginal bleeding and vaginal discharge.    Updated Vital Signs BP 128/77 (BP Location: Left Arm)   Pulse 81   Temp 99 F (37.2 C)   Resp 15   LMP 09/20/2024   SpO2 100%   Physical Exam Constitutional:      Appearance: She is ill-appearing.  Cardiovascular:     Rate and Rhythm: Normal rate and regular rhythm.     Pulses: Normal pulses.     Heart sounds: Normal heart sounds.  Pulmonary:      Effort: Pulmonary effort is normal.     Breath sounds: Normal breath sounds.  Abdominal:     General: Bowel sounds are normal. There is distension.     Palpations: There is no mass.     Tenderness: There is abdominal tenderness. There is guarding.   Skin:    General: Skin is warm.     Capillary Refill: Capillary refill takes less than 2 seconds.  Neurological:     General: No focal deficit present.     Mental Status: She is alert and oriented to person, place, and time. Mental status is at baseline.     (all labs ordered are listed, but only abnormal results are displayed) Labs Reviewed  LIPASE, BLOOD - Abnormal; Notable for the following components:      Result Value   Lipase 61 (*)    All other components within normal limits  COMPREHENSIVE METABOLIC PANEL WITH GFR - Abnormal; Notable for the following components:   Glucose, Bld 128 (*)    AST 14 (*)    All other components within normal limits  CBC - Abnormal; Notable for the following components:   WBC 11.5 (*)    RBC 5.40 (*)    Hemoglobin 11.1 (*)    MCV 70.0 (*)    MCH 20.6 (*)    MCHC 29.4 (*)    RDW 18.3 (*)    All  other components within normal limits  URINALYSIS, ROUTINE W REFLEX MICROSCOPIC - Abnormal; Notable for the following components:   APPearance HAZY (*)    Leukocytes,Ua SMALL (*)    Bacteria, UA RARE (*)    All other components within normal limits  HCG, SERUM, QUALITATIVE - Abnormal; Notable for the following components:   Preg, Serum POSITIVE (*)    All other components within normal limits  HCG, QUANTITATIVE, PREGNANCY    EKG: None  Radiology: No results found.   Procedures   Medications Ordered in the ED - No data to display                                  Medical Decision Making Laura Hernandez is a 34 y.o. female presenting for abdominal pain. On presentation vital signs stable. On exam she is in 8/10 pain feels lightheaded and unstable on her feet.  Lab work significant for  hgb 11.1. Bedside ultrasound visualized trace free fluid in the abdomen, uterus identified with no clear sac like structure.   Called on call OB/GYN Dr. Zina who recommended transfer to the MAU for further assessment. [facility] called for transport. Patient in stable condition.   Amount and/or Complexity of Data Reviewed Labs: ordered. Radiology: ordered.       Final diagnoses:  Positive urine pregnancy test  Abdominal pain, unspecified abdominal location    ED Discharge Orders     None          Cleotilde Perkins, DO 11/01/24 1835  "

## 2024-11-01 NOTE — Discharge Instructions (Addendum)
-   constipation is very common in pregnancy. Aim to drink 60oz of water per day. Moving your body helps move your gut! Try going for a walk; per day or for after meals. Eat plenty of foods with fiber or add a fiber supplement (psyllium fiber capsules, Metamucil, or Benefiber). Iron  and anti-nausea medications can also be very constipating, so if you are taking either of these, be sure to increase your fiber intake!   Safe Medications in Pregnancy  - Take medications as directed on the package. Medications are listed by Brand name, store brands are considered equivalent to brand name, just be sure that the medications are the same. Ex: Tylenol  (acetaminophen ) - If taking multiple medications, please check labels to avoid duplicating the same active ingredients - Do not exceed 4000 mg of Tylenol  (acetaminophen ) in 24 hours - Do not take medications that contain aspirin or ibuprofen  (Motrin , Advil ) or naproxen  (Aleve , Naprosyn )  Acne Benzoyl Peroxide Salicylic Acid  Allergies Benadryl  (diphenhydramine ) Claritin (loratadine) Flonase nasal spray (fluticasone) Saline nasal spray/drops  Backache/Headache Acetaminophen  (Tylenol ): 2 regular strength (325mg ) every 4 hours OR 2 extra strength (500mg ) every 6 hours  Colds/Coughs Breathe right strips Cepacol throat lozenges OR Chloraseptic throat spray cough drops, alcohol free Delsym (dextromethorphan, cough suppressant) Mucinex  (guaifenesin , mucolytic/expectorant) Robitussin DM (dextromethorphan + guaifenesin ) Saline nasal spray/drops Sudafed (pseudoephedrine , decongestant)  only after [redacted] weeks gestation and if you do not have high blood pressure Vicks Vaporub Zinc lozenges Zyrtec (cetirizine)  Constipation Immediate relief Ducolax suppositories (bisacodyl) Fleet enema (saline enema) glycerin  suppositories milk of magnesia Senokot (overnight) Smooth move tea (overnight)  to keep you regular Colace, Dulcolax  (docusate) Metamucil (psyllium fiber) Miralax (polyethylene glycol)   Diarrhea Kaopectate (bismuth subsalicylate) Imodium A-D (loperamide) *NO pepto Bismol Hemorrhoids Anusol  Anusol HC (Rx only) Preparation H Tucks  Indigestion Tums Maalox Mylanta Pepcid  Insomnia Benadryl  (alcohol free) 25mg  every 6 hours as needed Tylenol  PM Unisom , no Gelcaps  Leg Cramps Tums MagGel  Nausea/Vomiting Bonine Dramamine Emetrol Ginger extract Sea bands Meclizine (Rx only)  Nausea medication to take during pregnancy Unisom  (doxylamine  succinate 25 mg tablets) Take one half tablet daily at bedtime. Vitamin B6 100mg  tablets. Take one tablet twice a day (up to 200 mg per day).  Skin Rashes: Aveeno products Benadryl  cream or 25mg  pill every 6 hours as needed Calamine Lotion 1% cortisone cream  Yeast infection: Gyne-lotrimin 7 Monistat  7

## 2024-11-01 NOTE — ED Triage Notes (Signed)
 Abd cramping lower quadrants that radiates to back x 2 days, LMP 09/20/24,  was told she was preg. 2 days ago @ maternity admission with STD testing , was not told how far she was. Pt reports hx of miscarrying and reports this feels the same. Pt also c/o HA

## 2024-11-01 NOTE — ED Notes (Signed)
 [facility] called for transport to MAU.

## 2024-11-20 ENCOUNTER — Encounter: Payer: Self-pay | Admitting: Obstetrics and Gynecology

## 2024-11-22 ENCOUNTER — Encounter

## 2024-11-29 ENCOUNTER — Encounter

## 2024-12-02 ENCOUNTER — Encounter

## 2024-12-18 ENCOUNTER — Encounter: Admitting: Obstetrics and Gynecology

## 2024-12-25 ENCOUNTER — Ambulatory Visit (HOSPITAL_BASED_OUTPATIENT_CLINIC_OR_DEPARTMENT_OTHER): Admitting: Physical Therapy
